# Patient Record
Sex: Male | Born: 1970
Health system: Southern US, Community
[De-identification: ages and names within clinical notes are randomized; demographics above are authoritative.]

## PROBLEM LIST (undated history)

## (undated) DIAGNOSIS — F329 Major depressive disorder, single episode, unspecified: Secondary | ICD-10-CM

## (undated) DIAGNOSIS — R634 Abnormal weight loss: Secondary | ICD-10-CM

## (undated) DIAGNOSIS — I10 Essential (primary) hypertension: Secondary | ICD-10-CM

## (undated) DIAGNOSIS — F1910 Other psychoactive substance abuse, uncomplicated: Secondary | ICD-10-CM

## (undated) DIAGNOSIS — G43909 Migraine, unspecified, not intractable, without status migrainosus: Secondary | ICD-10-CM

## (undated) DIAGNOSIS — I639 Cerebral infarction, unspecified: Secondary | ICD-10-CM

## (undated) DIAGNOSIS — H269 Unspecified cataract: Secondary | ICD-10-CM

## (undated) DIAGNOSIS — E042 Nontoxic multinodular goiter: Secondary | ICD-10-CM

## (undated) DIAGNOSIS — E785 Hyperlipidemia, unspecified: Secondary | ICD-10-CM

## (undated) DIAGNOSIS — J189 Pneumonia, unspecified organism: Secondary | ICD-10-CM

## (undated) DIAGNOSIS — M1990 Unspecified osteoarthritis, unspecified site: Secondary | ICD-10-CM

## (undated) DIAGNOSIS — K219 Gastro-esophageal reflux disease without esophagitis: Secondary | ICD-10-CM

## (undated) DIAGNOSIS — F410 Panic disorder [episodic paroxysmal anxiety] without agoraphobia: Secondary | ICD-10-CM

## (undated) DIAGNOSIS — R7989 Other specified abnormal findings of blood chemistry: Secondary | ICD-10-CM

## (undated) DIAGNOSIS — F32A Depression, unspecified: Secondary | ICD-10-CM

## (undated) DIAGNOSIS — R51 Headache: Secondary | ICD-10-CM

## (undated) DIAGNOSIS — M549 Dorsalgia, unspecified: Secondary | ICD-10-CM

## (undated) DIAGNOSIS — Z973 Presence of spectacles and contact lenses: Secondary | ICD-10-CM

## (undated) DIAGNOSIS — G5601 Carpal tunnel syndrome, right upper limb: Secondary | ICD-10-CM

## (undated) DIAGNOSIS — G2581 Restless legs syndrome: Secondary | ICD-10-CM

## (undated) DIAGNOSIS — I219 Acute myocardial infarction, unspecified: Secondary | ICD-10-CM

## (undated) HISTORY — DX: Gastro-esophageal reflux disease without esophagitis: K21.9

## (undated) HISTORY — DX: Other psychoactive substance abuse, uncomplicated: F19.10

## (undated) HISTORY — DX: Essential (primary) hypertension: I10

## (undated) HISTORY — DX: Restless legs syndrome: G25.81

## (undated) HISTORY — PX: ESOPHAGOGASTRODUODENOSCOPY: SHX1529

## (undated) HISTORY — DX: Other specified abnormal findings of blood chemistry: R79.89

## (undated) HISTORY — DX: Dorsalgia, unspecified: M54.9

## (undated) HISTORY — PX: MULTIPLE TOOTH EXTRACTIONS: SHX2053

## (undated) HISTORY — DX: Depression, unspecified: F32.A

## (undated) HISTORY — DX: Abnormal weight loss: R63.4

## (undated) HISTORY — DX: Migraine, unspecified, not intractable, without status migrainosus: G43.909

## (undated) HISTORY — DX: Hyperlipidemia, unspecified: E78.5

## (undated) HISTORY — DX: Unspecified cataract: H26.9

## (undated) HISTORY — DX: Major depressive disorder, single episode, unspecified: F32.9

## (undated) HISTORY — DX: Headache: R51

---

## 2010-05-04 LAB — HM DIABETES FOOT EXAM

## 2011-04-07 ENCOUNTER — Ambulatory Visit: Payer: Self-pay | Admitting: Internal Medicine

## 2011-04-11 ENCOUNTER — Ambulatory Visit: Payer: Self-pay | Admitting: Internal Medicine

## 2011-04-28 ENCOUNTER — Encounter: Payer: Self-pay | Admitting: Family Medicine

## 2011-04-28 ENCOUNTER — Other Ambulatory Visit: Payer: Self-pay | Admitting: Family Medicine

## 2011-04-28 ENCOUNTER — Ambulatory Visit: Payer: Self-pay | Admitting: Internal Medicine

## 2011-04-28 ENCOUNTER — Ambulatory Visit (INDEPENDENT_AMBULATORY_CARE_PROVIDER_SITE_OTHER): Payer: 59 | Admitting: Family Medicine

## 2011-04-28 DIAGNOSIS — R413 Other amnesia: Secondary | ICD-10-CM

## 2011-04-28 DIAGNOSIS — E119 Type 2 diabetes mellitus without complications: Secondary | ICD-10-CM

## 2011-04-28 LAB — COMPREHENSIVE METABOLIC PANEL
ALT: 19 U/L (ref 0–53)
AST: 22 U/L (ref 0–37)
Alkaline Phosphatase: 68 U/L (ref 39–117)
CO2: 24 mEq/L (ref 19–32)
Creat: 1.02 mg/dL (ref 0.50–1.35)
Sodium: 139 mEq/L (ref 135–145)
Total Bilirubin: 0.3 mg/dL (ref 0.3–1.2)
Total Protein: 7.6 g/dL (ref 6.0–8.3)

## 2011-04-28 LAB — CBC WITH DIFFERENTIAL/PLATELET
Basophils Absolute: 0.1 10*3/uL (ref 0.0–0.1)
Eosinophils Absolute: 0.1 10*3/uL (ref 0.0–0.7)
Eosinophils Relative: 2 % (ref 0–5)
Lymphs Abs: 2.1 10*3/uL (ref 0.7–4.0)
MCH: 32.1 pg (ref 26.0–34.0)
MCV: 90.3 fL (ref 78.0–100.0)
Neutrophils Relative %: 64 % (ref 43–77)
Platelets: 199 10*3/uL (ref 150–400)
RBC: 4.55 MIL/uL (ref 4.22–5.81)
RDW: 13.1 % (ref 11.5–15.5)
WBC: 7.2 10*3/uL (ref 4.0–10.5)

## 2011-04-28 LAB — LIPID PANEL
HDL: 25 mg/dL — ABNORMAL LOW (ref >39)
Total CHOL/HDL Ratio: 7.2 Ratio
VLDL: 36 mg/dL (ref 0–40)

## 2011-04-28 MED ORDER — SUMATRIPTAN SUCCINATE 100 MG PO TABS: 100.0000 mg | ORAL_TABLET | ORAL | Status: DC | PRN

## 2011-04-28 MED ORDER — FENOFIBRATE 160 MG PO TABS: 160.0000 mg | ORAL_TABLET | Freq: Every day | ORAL | Status: DC

## 2011-04-28 MED ORDER — INSULIN ASPART 100 UNIT/ML ~~LOC~~ SOLN: SUBCUTANEOUS | Status: DC

## 2011-04-28 MED ORDER — ROSUVASTATIN CALCIUM 20 MG PO TABS: 20.0000 mg | ORAL_TABLET | Freq: Every day | ORAL | Status: DC

## 2011-04-28 NOTE — Progress Notes (Signed)
Office Note 05/01/2011  CC:  Chief Complaint  Patient presents with  . Establish Care  . Medication Refill    HPI:  [name] is a 40 y.o. White male who is here to establish care. Patient's most recent primary MD: Dr. Renne Crigler. Old records were reviewed prior to or during today's visit--some labs from his endocrinologist, Dr. Katrinka Blazing at [facility] .  Pt here with his wife today, both a bit frustrated. Had to change MD due to insurance, sees endo as well but wants to establish here and try to get everything managed here for convenience. Lives in [place]. Main issue is DM.  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 Says he was dx'd about 12-14 yrs ago, was on oral med "briefly" and had no effect, was switched to insulin after this and he claims he's been "far from well controlled", although his wife remembers A1c recently "about 7".  No hx of D.R or nephropathy.  Has had intermittent arm pains/paresthesias that have been dx'd as peripheral neuropathy and have improved on cymbalta, which has also helped some depression. He and wife quote some glucoses that "don't make sense"---some erratic readings, even some in the 20's without symptoms, then some in the 70s that he feels hypoglycemia sx's with.  Has c/o of memory problems which he says his last primary MD promised neuro referral for but never did.  He asks for neuro referral for this today. Reports hx of multinodular goiter, euthyroid per his report.  Past Medical History  Diagnosis Date  . Drug abuse     7 -8 years ago  . Depression     [facility]  . Diabetes mellitus     diagnosed 14 years ago  . Headache     sight/sound sensitvity  . GERD (gastroesophageal reflux disease)     onset age 67   . Allergic rhinitis     year round  . Hypertension     diagnosed age 47  . Hyperlipidemia     dx 10 years ago  Tobacco dependence--wants to quit but not contemplating attempt at this time.  Past Surgical History  Procedure Date  . No past surgeries     denies surgical history    Family History  Problem Relation Age of Onset  . Drug abuse Brother   . Drug abuse Father   . Drug abuse Mother   . Arthritis Father   . Arthritis      maternal and paternal grandaparents  . Colon cancer Paternal Grandmother   . Hyperlipidemia Father   . Hyperlipidemia Maternal Grandfather   . Hyperlipidemia Paternal Grandmother   . Hyperlipidemia Paternal Grandfather   . Hyperlipidemia Maternal Grandmother   . Heart disease Father     4-5 Heart attacks died age 77   . Stroke Father     age 34  . Heart disease Maternal Grandfather   . Heart disease Paternal Grandfather   . Stroke Maternal Grandmother   . Hypertension Father   . Hypertension      maternal and paternal grandparents  . Diabetes Father     type II  . Diabetes Maternal Grandmother     History   Social History  . Marital Status: Married  Spouse Name: N/A    Number of Children: N/A  . Years of Education: N/A   Occupational History  . Not on file.   Social History Main Topics  . Smoking status: Current Everyday Smoker    Types: Cigarettes  . Smokeless tobacco: Not on file   Comment: 1 ppd-started age [age]  . Alcohol Use: No  . Drug Use: No  . Sexually Active: Not on file   Other Topics Concern  . Not on file   Social History Narrative   Married, one 48 y/o son, currently unemployed.+Smoker.  No alc/drugs.    Outpatient Encounter Prescriptions as of 04/28/2011  Medication Sig Dispense Refill  . amLODipine (NORVASC) 10 MG tablet Take 10 mg by mouth daily.        . benazepril (LOTENSIN) 40 MG tablet Take 40 mg by mouth daily.        . DULoxetine (CYMBALTA) 60 MG capsule Take 60 mg by mouth daily.        Marland Kitchen  esomeprazole (NEXIUM) 40 MG capsule Take 40 mg by mouth daily before breakfast.        . fenofibrate 160 MG tablet Take 1 tablet (160 mg total) by mouth daily.  30 tablet  3  . insulin glargine (LANTUS SOLOSTAR) 100 UNIT/ML injection Inject 25 Units into the skin at bedtime.        . rosuvastatin (CRESTOR) 20 MG tablet Take 1 tablet (20 mg total) by mouth daily.  30 tablet  3  . SUMAtriptan (IMITREX) 100 MG tablet Take 1 tablet (100 mg total) by mouth every 2 (two) hours as needed. For headache  10 tablet  3  . DISCONTD: fenofibrate 160 MG tablet Take 160 mg by mouth daily.        Marland Kitchen DISCONTD: insulin aspart (NOVOLOG) 100 UNIT/ML injection Inject into the skin. Sliding scale       . DISCONTD: rosuvastatin (CRESTOR) 20 MG tablet Take 20 mg by mouth daily.        Marland Kitchen DISCONTD: SUMAtriptan (IMITREX) 100 MG tablet Take 100 mg by mouth every 2 (two) hours as needed. For headache       . insulin aspart (NOVOLOG FLEXPEN) 100 UNIT/ML injection Inject with meals as per your sliding scale instructions  3 mL  6    No Known Allergies  ROS Review of Systems  Constitutional: Negative for fever and fatigue.  HENT: Negative for congestion and sore throat. Ear pain: left ear w/sharp pain intermittently for 71mo.   Eyes: Negative for visual disturbance.  Respiratory: Negative for cough.   Cardiovascular: Negative for chest pain.  Gastrointestinal: Negative for nausea and abdominal pain.  Genitourinary: Negative for dysuria.  Musculoskeletal: Negative for back pain and joint swelling.  Skin: Negative for rash.  Neurological: Positive for headaches. Negative for weakness.  Hematological: Negative for adenopathy.     PE; Blood pressure 122/80, pulse 91, temperature 97.7 F (36.5 C), temperature source Oral, resp. rate 20, height 5\' 10"  (1.778 m), weight 201 lb (91.173 kg), SpO2 100.00%. Gen: Alert, well appearing.  Patient is oriented to person, place, time, and situation. HEENT: Scalp without lesions or  hair loss.  Ears: EACs clear, normal epithelium.  TMs with good light reflex and landmarks bilaterally.  Eyes: no injection, icteris, swelling, or exudate.  EOMI, PERRLA. Nose: no drainage or turbinate edema/swelling.  No injection or focal lesion.  Mouth: lips without lesion/swelling.  Oral mucosa pink and moist.  Dentition intact and without obvious caries or  gingival swelling.  Oropharynx without erythema, exudate, or swelling.  Neck: supple, ROM full.  Carotids 2+ bilat, without bruit.  No lymphadenopathy, thyromegaly, or mass. Chest: symmetric expansion, nonlabored respirations.  Clear and equal breath sounds in all lung fields.   CV: RRR, no m/r/g.  Peripheral pulses 2+ and symmetric. EXT: no clubbing, cyanosis, or edema.   Pertinent labs:  none  ASSESSMENT AND PLAN:   Diabetes mellitus Insulin dependent, with diabetic neuropathy in UE's only. Will gather records. Cont current care at this time. Check HbA1c and urine microalbumin today, as well as lipids, CBC, CMET, and TSH.  Memory loss We did not explore this complaint today, but patient pretty frustrated and asks for neuro referral that he had been promised in the past by his prior MD, so will make neuro referral today.   Otalgia, left.  Reassured pt today that no abnormality was found.  Return in about 3 months (around 07/29/2011).

## 2011-04-29 LAB — T3: T3, Total: 76.6 ng/dL — ABNORMAL LOW (ref 80.0–204.0)

## 2011-05-01 ENCOUNTER — Encounter: Payer: Self-pay | Admitting: Family Medicine

## 2011-05-01 DIAGNOSIS — E1165 Type 2 diabetes mellitus with hyperglycemia: Secondary | ICD-10-CM | POA: Insufficient documentation

## 2011-05-01 DIAGNOSIS — R413 Other amnesia: Secondary | ICD-10-CM | POA: Insufficient documentation

## 2011-05-01 NOTE — Assessment & Plan Note (Addendum)
Insulin dependent, with diabetic neuropathy in UE's only. Will gather records. Cont current care at this time. Check HbA1c and urine microalbumin today, as well as lipids, CBC, CMET, and TSH.

## 2011-05-01 NOTE — Assessment & Plan Note (Signed)
We did not explore this complaint today, but patient pretty frustrated and asks for neuro referral that he had been promised in the past by his prior MD, so will make neuro referral today.

## 2011-05-03 ENCOUNTER — Telehealth: Payer: Self-pay | Admitting: Family Medicine

## 2011-05-03 NOTE — Telephone Encounter (Signed)
Please request records from [facility] (Dr. Renne Crigler and Dr. Talmage Nap both saw him).  Thx--PM

## 2011-05-20 ENCOUNTER — Telehealth: Payer: Self-pay | Admitting: Family Medicine

## 2011-05-20 NOTE — Telephone Encounter (Signed)
Please request records again from Dr. Renne Crigler and Dr. Talmage Nap at [facility]--Thx.

## 2011-05-20 NOTE — Telephone Encounter (Signed)
This was sent to me in error.

## 2011-06-02 ENCOUNTER — Telehealth: Payer: Self-pay | Admitting: Internal Medicine

## 2011-06-02 ENCOUNTER — Emergency Department (HOSPITAL_BASED_OUTPATIENT_CLINIC_OR_DEPARTMENT_OTHER)
Admission: EM | Admit: 2011-06-02 | Discharge: 2011-06-03 | Disposition: A | Discharge status: Home / Self Care | Payer: 59 | Attending: Emergency Medicine | Admitting: Emergency Medicine

## 2011-06-02 ENCOUNTER — Encounter (HOSPITAL_BASED_OUTPATIENT_CLINIC_OR_DEPARTMENT_OTHER): Payer: Self-pay | Admitting: *Deleted

## 2011-06-02 DIAGNOSIS — L0291 Cutaneous abscess, unspecified: Secondary | ICD-10-CM | POA: Insufficient documentation

## 2011-06-02 DIAGNOSIS — E785 Hyperlipidemia, unspecified: Secondary | ICD-10-CM | POA: Insufficient documentation

## 2011-06-02 DIAGNOSIS — I10 Essential (primary) hypertension: Secondary | ICD-10-CM | POA: Insufficient documentation

## 2011-06-02 DIAGNOSIS — K219 Gastro-esophageal reflux disease without esophagitis: Secondary | ICD-10-CM | POA: Insufficient documentation

## 2011-06-02 DIAGNOSIS — F17200 Nicotine dependence, unspecified, uncomplicated: Secondary | ICD-10-CM | POA: Insufficient documentation

## 2011-06-02 DIAGNOSIS — M79609 Pain in unspecified limb: Secondary | ICD-10-CM | POA: Insufficient documentation

## 2011-06-02 DIAGNOSIS — L0390 Cellulitis, unspecified: Secondary | ICD-10-CM

## 2011-06-02 DIAGNOSIS — E119 Type 2 diabetes mellitus without complications: Secondary | ICD-10-CM | POA: Insufficient documentation

## 2011-06-02 LAB — CBC
Hemoglobin: 13.1 g/dL (ref 13.0–17.0)
MCH: 32 pg (ref 26.0–34.0)
MCV: 89 fL (ref 78.0–100.0)
RBC: 4.09 MIL/uL — ABNORMAL LOW (ref 4.22–5.81)
WBC: 8.1 10*3/uL (ref 4.0–10.5)

## 2011-06-02 MED ORDER — SODIUM CHLORIDE 0.9 % IV SOLN
INTRAVENOUS | Status: DC
  Administered 2011-06-02: 23:00:00 via INTRAVENOUS

## 2011-06-02 MED ORDER — SODIUM CHLORIDE 0.9 % IV SOLN: 20.0000 mL | INTRAVENOUS | Status: DC

## 2011-06-02 MED ORDER — HYDROMORPHONE HCL 1 MG/ML IJ SOLN
1.0000 mg | Freq: Once | INTRAMUSCULAR | Status: AC
  Administered 2011-06-02: 1 mg via INTRAVENOUS
  Filled 2011-06-02: qty 1

## 2011-06-02 MED ORDER — CLINDAMYCIN PHOSPHATE 900 MG/50ML IV SOLN
900.0000 mg | Freq: Once | INTRAVENOUS | Status: AC
  Administered 2011-06-02: 900 mg via INTRAVENOUS
  Filled 2011-06-02: qty 50

## 2011-06-02 NOTE — ED Notes (Signed)
Pt reports right elbow redness/swelling/pain that began approx 2days ago. Saw an Urgent care yesterday, and was prescribed Doxycycline PO RX and given a Rocephin injection at visit. Was told to f/u sooner if appearance worsened. Pt states the redness and pain worsened at 4am today. Redness has extended past the previously marked site. Concerned that ABX is not effectively treating infection. Family hx of MRSA, but none personally

## 2011-06-02 NOTE — Telephone Encounter (Signed)
Comments from [facility] pharmacy:  Patient is now seeing your office for his diabetic needs. I need two prescriptions until he can be seen at his September appt.   Alcohol pads 90 day supply(uses for injections and monitoring, #8-10+ ?/day) (Sent 1 box at no charge)  One touch ultra test strips # 800. Check blood sugar 8 times/per day.

## 2011-06-02 NOTE — ED Notes (Signed)
Pt has ABX infusing. States pain has started to subside. Denies any complaints at this time. Reading magazine at this time. Instructed to notify staff of any additional needs.

## 2011-06-03 LAB — BASIC METABOLIC PANEL
BUN: 16 mg/dL (ref 6–23)
CO2: 21 mEq/L (ref 19–32)
Calcium: 9.4 mg/dL (ref 8.4–10.5)
Creatinine, Ser: 0.8 mg/dL (ref 0.50–1.35)
Glucose, Bld: 216 mg/dL — ABNORMAL HIGH (ref 70–99)

## 2011-06-03 MED ORDER — HYDROCODONE-ACETAMINOPHEN 5-325 MG PO TABS: 2.0000 | ORAL_TABLET | ORAL | Status: AC | PRN

## 2011-06-03 MED ORDER — GLUCOSE BLOOD VI STRP: ORAL_STRIP | Status: DC

## 2011-06-03 MED ORDER — HYDROCODONE-ACETAMINOPHEN 5-325 MG PO TABS
2.0000 | ORAL_TABLET | Freq: Once | ORAL | Status: AC
  Administered 2011-06-03: 2 via ORAL
  Filled 2011-06-03: qty 2

## 2011-06-03 MED ORDER — ROSUVASTATIN CALCIUM 40 MG PO TABS: 40.0000 mg | ORAL_TABLET | Freq: Every day | ORAL | Status: DC

## 2011-06-03 MED ORDER — ALCOHOL PREPS PADS: MEDICATED_PAD | Status: DC

## 2011-06-03 MED ORDER — CLINDAMYCIN HCL 150 MG PO CAPS: 450.0000 mg | ORAL_CAPSULE | Freq: Three times a day (TID) | ORAL | Status: AC

## 2011-06-03 NOTE — Telephone Encounter (Signed)
Believe he is using lantus + sliding scale which account for increased fsbs and injxns. Ok to authorize. Likely will require insurance override form due to amounts.

## 2011-06-03 NOTE — Telephone Encounter (Signed)
Left message for pt to return my call re: reason for checking BS 8 times daily.

## 2011-06-03 NOTE — Telephone Encounter (Signed)
Pt notified and requested refill on his Crestor 40mg . Refill sent to pharmacy.

## 2011-06-03 NOTE — Telephone Encounter (Signed)
Pt returned my call and states he is no longer using an insulin pump and is now taking 6 insulin injections a day. He reports that he is checking his blood sugar at least 6 times a day and sometimes 8 due to fluctuations in his blood sugar levels. Pt is requesting 9 boxes of alcohol pads for 90 days and # 800 test strips for 90 days. Please advise.

## 2011-06-03 NOTE — Telephone Encounter (Signed)
Patient returned phone call. Best# [phone]

## 2011-06-03 NOTE — ED Provider Notes (Addendum)
History    the patient is a 40 year old male who presents with 3 days of worsening erythema induration and tenderness over the right forearm/upper arm, seen by his primary care physician 2 days ago and started on doxycycline yesterday with progression despite antibiotics use. It is evident within the area of the erythema where the patient's physician had circumscribed the extent of erythema yesterday. Today, erythema has spread approximately 2-3 cm in all directions outside of the circumscribed area, demonstrating the progression of the patient's cellulitis. Within the center of the cellulitis is a scabbed lesion the patient said had been there "for about 2 months", and he is unsure of what the original insult to the skin was. The pain at the skin lesion is described as tenderness and is made worse by palpation.  Chief Complaint  Patient presents with  . Arm Pain  . Wound Infection   Patient is a 40 y.o. male presenting with arm pain. The history is provided by the patient.  Arm Pain This is a new problem. Episode onset: 3 days ago. The problem occurs constantly. The problem has been gradually worsening. The symptoms are relieved by nothing. The treatment provided no relief.    Past Medical History  Diagnosis Date  . Drug abuse     7 -8 years ago  . Depression     [facility]  . Diabetes mellitus     diagnosed 14 years ago  . Headache     sight/sound sensitvity  . GERD (gastroesophageal reflux disease)     onset age 53  . Allergic rhinitis     year round  . Hypertension     diagnosed age 37  . Hyperlipidemia     dx 10 years ago    Past Surgical History  Procedure Date  . No past surgeries     denies surgical history    Family History  Problem Relation Age of Onset  . Drug abuse Brother   . Drug abuse Father   . Drug abuse Mother   . Arthritis Father   . Arthritis      maternal and paternal grandaparents  . Colon cancer  Paternal Grandmother   . Hyperlipidemia Father   . Hyperlipidemia Maternal Grandfather   . Hyperlipidemia Paternal Grandmother   . Hyperlipidemia Paternal Grandfather   . Hyperlipidemia Maternal Grandmother   . Heart disease Father     4-5 Heart attacks died age 29   . Stroke Father     age 71  . Heart disease Maternal Grandfather   . Heart disease Paternal Grandfather   . Stroke Maternal Grandmother   . Hypertension Father   . Hypertension      maternal and paternal grandparents  . Diabetes Father     type II  . Diabetes Maternal Grandmother     History  Substance Use Topics  . Smoking status: Current Everyday Smoker -- 1.0 packs/day    Types: Cigarettes  . Smokeless tobacco: Not on file   Comment: 1 ppd-started age [age]  . Alcohol Use: No      Review of Systems  Constitutional: Negative for fever, chills and fatigue.  Musculoskeletal: Negative for joint swelling and arthralgias.  Skin: Positive for color change, rash and wound.  Hematological: Negative for adenopathy.    Physical Exam  BP 115/79  Pulse 81  Temp(Src) 97.7 F (36.5 C) (Oral)  Resp 19  SpO2 99%  Physical Exam  Constitutional: He  is oriented to person, place, and time. He appears well-developed and well-nourished. No distress.  HENT:  Head: Normocephalic and atraumatic.  Eyes: EOM are normal. Pupils are equal, round, and reactive to light.  Neck: Normal range of motion. Neck supple.  Cardiovascular: Normal rate and regular rhythm.   Pulmonary/Chest: Effort normal and breath sounds normal.  Musculoskeletal: He exhibits no edema and no tenderness.  Lymphadenopathy:    He has no axillary adenopathy.  Neurological: He is alert and oriented to person, place, and time.  Skin: Skin is warm and dry. Rash noted. Rash is macular. He is not diaphoretic. There is erythema.     Psychiatric: He has a normal mood and affect.    ED Course  Procedures 900 mg clindamycin infused by IV in ED.  I will  switch the patient from doxycycline to oral clindamycin for antibiotic coverage, and have him follow up in the ED or with his PCP tomorrow to recheck the cellulitis and assure that the line of erythema is retreating.  The patient states his understanding of and agreement with this plan of care. MDM Cellulitis, MRSA      Felisa Bonier, MD 06/03/11 0020    Call from outpt pharmacy, to check on dose of the clindamycin.  I checked on pt and he has a cellulitis of the right elbow.  He had been prescribed clindamycin.  I checked the dose of this and made his prescription for clindamycin 450 mg qid, Rx enough for 3 days.  He is returning to be rechecked later today.  If he ends up needing hospitalization for IV antibiotics he will not need ten days worth of clindamycin.    Carleene Cooper III, MD 06/03/11 [phone]

## 2011-06-04 ENCOUNTER — Inpatient Hospital Stay (HOSPITAL_COMMUNITY)
Admission: AD | Admit: 2011-06-04 | Discharge: 2011-06-06 | DRG: 603 | Disposition: A | Discharge status: Home / Self Care | Payer: 59 | Source: Other Acute Inpatient Hospital | Attending: Infectious Diseases | Admitting: Infectious Diseases

## 2011-06-04 ENCOUNTER — Emergency Department (HOSPITAL_BASED_OUTPATIENT_CLINIC_OR_DEPARTMENT_OTHER)
Admission: EM | Admit: 2011-06-04 | Discharge: 2011-06-04 | Disposition: A | Discharge status: Still In Hospital | Payer: 59 | Source: Home / Self Care | Attending: Emergency Medicine | Admitting: Emergency Medicine

## 2011-06-04 ENCOUNTER — Encounter (HOSPITAL_BASED_OUTPATIENT_CLINIC_OR_DEPARTMENT_OTHER): Payer: Self-pay | Admitting: Emergency Medicine

## 2011-06-04 DIAGNOSIS — F17200 Nicotine dependence, unspecified, uncomplicated: Secondary | ICD-10-CM | POA: Diagnosis present

## 2011-06-04 DIAGNOSIS — Z794 Long term (current) use of insulin: Secondary | ICD-10-CM

## 2011-06-04 DIAGNOSIS — F1910 Other psychoactive substance abuse, uncomplicated: Secondary | ICD-10-CM | POA: Diagnosis present

## 2011-06-04 DIAGNOSIS — F3289 Other specified depressive episodes: Secondary | ICD-10-CM | POA: Diagnosis present

## 2011-06-04 DIAGNOSIS — E119 Type 2 diabetes mellitus without complications: Secondary | ICD-10-CM | POA: Diagnosis present

## 2011-06-04 DIAGNOSIS — I10 Essential (primary) hypertension: Secondary | ICD-10-CM | POA: Diagnosis present

## 2011-06-04 DIAGNOSIS — IMO0002 Reserved for concepts with insufficient information to code with codable children: Principal | ICD-10-CM | POA: Diagnosis present

## 2011-06-04 DIAGNOSIS — L0390 Cellulitis, unspecified: Secondary | ICD-10-CM

## 2011-06-04 DIAGNOSIS — F329 Major depressive disorder, single episode, unspecified: Secondary | ICD-10-CM | POA: Diagnosis present

## 2011-06-04 LAB — GLUCOSE, CAPILLARY: Glucose-Capillary: 90 mg/dL (ref 70–99)

## 2011-06-04 LAB — MRSA PCR SCREENING: MRSA by PCR: NEGATIVE

## 2011-06-04 LAB — BASIC METABOLIC PANEL
BUN: 19 mg/dL (ref 6–23)
Calcium: 9.6 mg/dL (ref 8.4–10.5)
GFR calc Af Amer: >60 mL/min (ref >60)
GFR calc non Af Amer: >60 mL/min (ref >60)
Potassium: 3.6 mEq/L (ref 3.5–5.1)
Sodium: 139 mEq/L (ref 135–145)

## 2011-06-04 LAB — DIFFERENTIAL
Basophils Relative: 1 % (ref 0–1)
Eosinophils Absolute: 0.1 10*3/uL (ref 0.0–0.7)
Eosinophils Relative: 1 % (ref 0–5)
Neutrophils Relative %: 67 % (ref 43–77)

## 2011-06-04 LAB — CBC
MCH: 32.3 pg (ref 26.0–34.0)
MCHC: 35.6 g/dL (ref 30.0–36.0)
Platelets: 160 10*3/uL (ref 150–400)

## 2011-06-04 MED ORDER — MORPHINE SULFATE 4 MG/ML IJ SOLN
INTRAMUSCULAR | Status: AC
  Administered 2011-06-04: 4 mg via INTRAVENOUS
  Filled 2011-06-04: qty 1

## 2011-06-04 MED ORDER — LIDOCAINE HCL 2 % IJ SOLN
20.0000 mL | Freq: Once | INTRAMUSCULAR | Status: AC
  Administered 2011-06-04: 400 mg

## 2011-06-04 MED ORDER — INSULIN ASPART 100 UNIT/ML ~~LOC~~ SOLN
15.0000 [IU] | Freq: Once | SUBCUTANEOUS | Status: AC
  Administered 2011-06-04: 15 [IU] via SUBCUTANEOUS
  Filled 2011-06-04: qty 3

## 2011-06-04 MED ORDER — VANCOMYCIN HCL IN DEXTROSE 1-5 GM/200ML-% IV SOLN
INTRAVENOUS | Status: AC
  Administered 2011-06-04: 1000 mg via INTRAVENOUS
  Filled 2011-06-04: qty 200

## 2011-06-04 MED ORDER — VANCOMYCIN HCL IN DEXTROSE 1-5 GM/200ML-% IV SOLN
1000.0000 mg | Freq: Once | INTRAVENOUS | Status: AC
  Administered 2011-06-04: 1000 mg via INTRAVENOUS

## 2011-06-04 MED ORDER — HYDROMORPHONE HCL 1 MG/ML IJ SOLN
1.0000 mg | Freq: Once | INTRAMUSCULAR | Status: AC
  Administered 2011-06-04: 1 mg via INTRAVENOUS
  Filled 2011-06-04: qty 1

## 2011-06-04 MED ORDER — MORPHINE SULFATE 4 MG/ML IJ SOLN
4.0000 mg | Freq: Once | INTRAMUSCULAR | Status: AC
  Administered 2011-06-04: 4 mg via INTRAVENOUS

## 2011-06-04 MED ORDER — SODIUM CHLORIDE 0.9 % IV SOLN
Freq: Once | INTRAVENOUS | Status: AC
  Administered 2011-06-04: 13:00:00 via INTRAVENOUS

## 2011-06-04 MED ORDER — LIDOCAINE HCL 2 % IJ SOLN
INTRAMUSCULAR | Status: AC
  Administered 2011-06-04: 400 mg
  Filled 2011-06-04: qty 1

## 2011-06-04 MED ORDER — INSULIN ASPART 100 UNIT/ML ~~LOC~~ SOLN
SUBCUTANEOUS | Status: AC
  Filled 2011-06-04: qty 3

## 2011-06-04 MED ORDER — VANCOMYCIN HCL 10 G IV SOLR
1.0000 g | Freq: Once | INTRAVENOUS | Status: DC
  Filled 2011-06-04: qty 1000

## 2011-06-04 MED ORDER — SODIUM CHLORIDE 0.9 % IV SOLN: Freq: Once | INTRAVENOUS | Status: DC

## 2011-06-04 NOTE — ED Notes (Signed)
Pt has abcess to right elbow which has progressed in size and redness.

## 2011-06-04 NOTE — ED Notes (Signed)
Patient is resting comfortably.    IV infusing well.  No acute distress noted.

## 2011-06-04 NOTE — ED Provider Notes (Signed)
History     Chief Complaint  Patient presents with  . Abscess   HPI  Past Medical History  Diagnosis Date  . Drug abuse     7 -8 years ago  . Depression     [facility]  . Diabetes mellitus     diagnosed 14 years ago  . Headache     sight/sound sensitvity  . GERD (gastroesophageal reflux disease)     onset age 40  . Allergic rhinitis     year round  . Hypertension     diagnosed age 40  . Hyperlipidemia     dx 10 years ago    Past Surgical History  Procedure Date  . No past surgeries     denies surgical history    Family History  Problem Relation Age of Onset  . Drug abuse Brother   . Drug abuse Father   . Drug abuse Mother   . Arthritis Father   . Arthritis      maternal and paternal grandaparents  . Colon cancer Paternal Grandmother   . Hyperlipidemia Father   . Hyperlipidemia Maternal Grandfather   . Hyperlipidemia Paternal Grandmother   . Hyperlipidemia Paternal Grandfather   . Hyperlipidemia Maternal Grandmother   . Heart disease Father     4-5 Heart attacks died age 21   . Stroke Father     age 35  . Heart disease Maternal Grandfather   . Heart disease Paternal Grandfather   . Stroke Maternal Grandmother   . Hypertension Father   . Hypertension      maternal and paternal grandparents  . Diabetes Father     type II  . Diabetes Maternal Grandmother     History  Substance Use Topics  . Smoking status: Current Everyday Smoker -- 1.0 packs/day    Types: Cigarettes  . Smokeless tobacco: Not on file   Comment: 1 ppd-started age [age]  . Alcohol Use: No      Review of Systems  Physical Exam  BP 123/89  Pulse 88  Temp(Src) 98.3 F (36.8 C) (Oral)  Resp 20  Ht 5\' 10"  (1.778 m)  Wt 203 lb (92.08 kg)  BMI 29.13 kg/m2  SpO2 99%  Physical Exam  ED Course  Procedures  MDM       Rodena Medin, PA 06/04/11 1420

## 2011-06-05 LAB — GLUCOSE, CAPILLARY
Glucose-Capillary: 303 mg/dL — ABNORMAL HIGH (ref 70–99)
Glucose-Capillary: 65 mg/dL — ABNORMAL LOW (ref 70–99)

## 2011-06-05 LAB — HEMOGLOBIN A1C
Hgb A1c MFr Bld: 7.7 % — ABNORMAL HIGH (ref <5.7)
Mean Plasma Glucose: 174 mg/dL — ABNORMAL HIGH (ref <117)

## 2011-06-06 LAB — CBC
HCT: 33.7 % — ABNORMAL LOW (ref 39.0–52.0)
Hemoglobin: 11.9 g/dL — ABNORMAL LOW (ref 13.0–17.0)
MCV: 89.9 fL (ref 78.0–100.0)
RDW: 12.5 % (ref 11.5–15.5)
WBC: 4.5 10*3/uL (ref 4.0–10.5)

## 2011-06-06 LAB — BASIC METABOLIC PANEL
BUN: 14 mg/dL (ref 6–23)
Chloride: 99 mEq/L (ref 96–112)
Creatinine, Ser: 1 mg/dL (ref 0.50–1.35)
Glucose, Bld: 168 mg/dL — ABNORMAL HIGH (ref 70–99)
Potassium: 3.7 mEq/L (ref 3.5–5.1)

## 2011-06-08 LAB — CULTURE, ROUTINE-ABSCESS

## 2011-06-09 LAB — CULTURE, BLOOD (ROUTINE X 2)
Culture  Setup Time: 201207130514
Culture: NO GROWTH

## 2011-06-11 NOTE — Discharge Summary (Signed)
[name], [name]                  ACCOUNT NO.:  [id number]  MEDICAL RECORD NO.:  [id number]  LOCATION:  1308                         FACILITY:  [facility]  PHYSICIAN:  Kela Millin, M.D.DATE OF BIRTH:  1971-07-16  DATE OF ADMISSION:  06/04/2011 DATE OF DISCHARGE:  06/06/2011                        DISCHARGE SUMMARY - REFERRING   DISCHARGE DIAGNOSES: 1. Right elbow abscess/cellulitis - blood and abscess cultures     negative to date, follow up with primary care physician. 2. Diabetes mellitus. 3. Hypertension. 4. Depression. 5. Prior history of drug abuse. 6. Tobacco abuse.  PROCEDURES AND STUDIES:  Status post I and D per ED physician at the [facility].  BRIEF HISTORY:  The patient is a 40 year old white male with above- listed medical problems who presented with complaints of increasing redness, pain and swelling on his right elbow for several days.  He reported he had been seen twice as an outpatient, the first time he was started on doxycycline and the second time clindamycin.  However, the redness that was noted on each of those visits continued to expand.  On the day prior to admission, he reported that hit his arm against something and a lot of pus drained.  He presented to the [facility] ED where incision and drainage was done and cultures sent and he was started on vancomycin and transferred to [facility] for admission.  He was admitted for further evaluation and management.  HOSPITAL COURSE: 1. Right elbow abscess/cellulitis - as discussed above I and D was     [facility].  The patient was started     on empiric antibiotics with vancomycin.  Blood and abscess cultures     done so far are showing no growth.  The patient has remained     afebrile with no leukocytosis.  His last white cell count today is     4.5.  He has improved clinically with decreased erythema, swelling     and drainage.  He is to continue  dressing changes b.i.d. upon     discharge and he states that his wife is able to [name] the dressing     changes and that she works at a [employer].  He will be     discharged on oral antibiotics - doxycycline and is to follow up     with Dr. Artist Pais and have followup on the final blood and abscess     cultures at that time as well. 2. Diabetes mellitus - the patient was maintained on Lantus during     this hospital stay as well as sliding scale.  He is to continue his     Lantus and follow up outpatient. 3. Hypertension - he is to continue his outpatient medications upon     discharge. 4. His other chronic medical conditions remained stable during this     hospital stay and he is to continue his outpatient medications as     previously.  DISCHARGE MEDICATIONS: 1. Doxycycline 100 mg p.o. b.i.d. 2. Amlodipine/benazepril 10/40 one p.o. daily. 3. Aspirin 325 mg p.o. daily. 4. Zyrtec 10 mg p.o. daily.  5. Crestor 20 mg 2 tablets daily. 6. Cymbalta 60 mg one p.o. daily. 7. Vicodin 5/325 two tablets q.4 h p.r.n. 8. Lantus 25 units subcu q.h.s.9. Nexium 40 mg p.o. daily. 10.NovoLog sliding scale as previously.  FOLLOWUP CARE:  Dr. Artist Pais in 1 to 2 weeks, call for appointment.  DISCHARGE CONDITION:  Improved/stable.     Kela Millin, M.D.     ACV/MEDQ  D:  06/06/2011  T:  06/06/2011  Job:  [id number]  cc:   Mark Hair. [place], [name] [address] [place], [place] [address]  Electronically Signed by Donnalee Curry M.D. on 06/11/2011 09:55:00 PM

## 2011-06-17 ENCOUNTER — Telehealth: Payer: Self-pay | Admitting: Internal Medicine

## 2011-06-17 MED ORDER — ESOMEPRAZOLE MAGNESIUM 40 MG PO CPDR: 40.0000 mg | DELAYED_RELEASE_CAPSULE | Freq: Every day | ORAL | Status: DC

## 2011-06-17 NOTE — Telephone Encounter (Signed)
Rx refill sent to pharmacy. 

## 2011-06-17 NOTE — Telephone Encounter (Signed)
Refill- nexium 40mg  capsule. Take one capsule by mouth daily. Qty 90. Last fill 5.8.12

## 2011-08-04 ENCOUNTER — Ambulatory Visit (HOSPITAL_BASED_OUTPATIENT_CLINIC_OR_DEPARTMENT_OTHER)
Admission: RE | Admit: 2011-08-04 | Discharge: 2011-08-04 | Disposition: A | Discharge status: Home / Self Care | Payer: 59 | Source: Ambulatory Visit | Attending: Internal Medicine | Admitting: Internal Medicine

## 2011-08-04 ENCOUNTER — Ambulatory Visit: Payer: 59 | Admitting: Internal Medicine

## 2011-08-04 ENCOUNTER — Encounter: Payer: Self-pay | Admitting: Internal Medicine

## 2011-08-04 ENCOUNTER — Ambulatory Visit (INDEPENDENT_AMBULATORY_CARE_PROVIDER_SITE_OTHER): Payer: 59 | Admitting: Internal Medicine

## 2011-08-04 DIAGNOSIS — E049 Nontoxic goiter, unspecified: Secondary | ICD-10-CM | POA: Insufficient documentation

## 2011-08-04 DIAGNOSIS — E042 Nontoxic multinodular goiter: Secondary | ICD-10-CM

## 2011-08-04 DIAGNOSIS — R946 Abnormal results of thyroid function studies: Secondary | ICD-10-CM

## 2011-08-04 DIAGNOSIS — E041 Nontoxic single thyroid nodule: Secondary | ICD-10-CM

## 2011-08-04 DIAGNOSIS — E119 Type 2 diabetes mellitus without complications: Secondary | ICD-10-CM

## 2011-08-04 DIAGNOSIS — Z79899 Other long term (current) drug therapy: Secondary | ICD-10-CM

## 2011-08-04 DIAGNOSIS — R5383 Other fatigue: Secondary | ICD-10-CM

## 2011-08-04 LAB — BASIC METABOLIC PANEL
BUN: 18 mg/dL (ref 6–23)
CO2: 21 mEq/L (ref 19–32)
Calcium: 9.7 mg/dL (ref 8.4–10.5)
Chloride: 102 mEq/L (ref 96–112)
Creat: 1.12 mg/dL (ref 0.50–1.35)
Glucose, Bld: 285 mg/dL — ABNORMAL HIGH (ref 70–99)

## 2011-08-04 LAB — HEPATIC FUNCTION PANEL
AST: 21 U/L (ref 0–37)
Albumin: 5 g/dL (ref 3.5–5.2)
Alkaline Phosphatase: 88 U/L (ref 39–117)
Indirect Bilirubin: 0.2 mg/dL (ref 0.0–0.9)
Total Protein: 7.5 g/dL (ref 6.0–8.3)

## 2011-08-04 LAB — CBC
HCT: 40.8 % (ref 39.0–52.0)
Hemoglobin: 14.1 g/dL (ref 13.0–17.0)
MCH: 32.3 pg (ref 26.0–34.0)
MCV: 93.6 fL (ref 78.0–100.0)
RBC: 4.36 MIL/uL (ref 4.22–5.81)
WBC: 6.1 10*3/uL (ref 4.0–10.5)

## 2011-08-04 LAB — TESTOSTERONE: Testosterone: 436.76 ng/dL (ref 250–890)

## 2011-08-04 NOTE — Patient Instructions (Signed)
Please schedule chem7, a1c, urine microalbumin 250.0 and lipid 272.4 prior to next visit

## 2011-08-04 NOTE — Progress Notes (Signed)
  Subjective:    Patient ID: [name], male    DOB: October 08, 1971, 40 y.o.   MRN: [id number]  HPI Pt presents to clinic for followup of multiple medical problems. Notes variability of fsbs 29-599. States no sx's of hypoglycemia with low sugars initially. Glucometer over 46year old but used another glucometer with similar results.  Taking lantus 25 units qhs with qac sliding scale for novolog. Previously followed by endocrine. H/o right thyroid nodules x3 reportedly s/p neg bx. Last [place] ~ several years ago. Complains of fatigue.  Past Medical History  Diagnosis Date  . Drug abuse     7 -8 years ago  . Depression     [facility]  . Diabetes mellitus     diagnosed 14 years ago  . Headache     sight/sound sensitvity  . GERD (gastroesophageal reflux disease)     onset age 43  . Allergic rhinitis     year round  . Hypertension     diagnosed age 23  . Hyperlipidemia     dx 10 years ago   Past Surgical History  Procedure Date  . No past surgeries     denies surgical history    reports that he has been smoking Cigarettes.  He has been smoking about 1 pack per day. He does not have any smokeless tobacco history on file. He reports that he does not drink alcohol or use illicit drugs. family history includes Arthritis in his father and unspecified family member; Colon cancer in his paternal grandmother; Diabetes in his father and maternal grandmother; Drug abuse in his brother, father, and mother; Heart disease in his father, maternal grandfather, and paternal grandfather; Hyperlipidemia in his father, maternal grandfather, maternal grandmother, paternal grandfather, and paternal grandmother; Hypertension in his father and unspecified family member; and Stroke in his father and maternal grandmother. No Known Allergies   Review of Systems see hpi     Objective:   Physical Exam  Physical Exam  Nursing note and vitals reviewed. Constitutional:  Appears well-developed and well-nourished. No distress.  HENT:  Head: Normocephalic and atraumatic.  Right Ear: External ear normal.  Left Ear: External ear normal.  Eyes: Conjunctivae are normal. No scleral icterus.  Neck: Neck supple. Carotid bruit is not present.  Cardiovascular: Normal rate, regular rhythm and normal heart sounds.  Exam reveals no gallop and no friction rub.   No murmur heard. Pulmonary/Chest: Effort normal and breath sounds normal. No respiratory distress. He has no wheezes. no rales.  Lymphadenopathy:    He has no cervical adenopathy.  Neurological:Alert.  Skin: Skin is warm and dry. Not diaphoretic.  Psychiatric: Has a normal mood and affect.        Assessment & Plan:

## 2011-08-05 ENCOUNTER — Other Ambulatory Visit: Payer: Self-pay | Admitting: Internal Medicine

## 2011-08-05 ENCOUNTER — Other Ambulatory Visit (HOSPITAL_BASED_OUTPATIENT_CLINIC_OR_DEPARTMENT_OTHER): Payer: 59

## 2011-08-05 DIAGNOSIS — E785 Hyperlipidemia, unspecified: Secondary | ICD-10-CM

## 2011-08-06 DIAGNOSIS — R5383 Other fatigue: Secondary | ICD-10-CM | POA: Insufficient documentation

## 2011-08-06 DIAGNOSIS — E041 Nontoxic single thyroid nodule: Secondary | ICD-10-CM | POA: Insufficient documentation

## 2011-08-06 NOTE — Assessment & Plan Note (Signed)
Schedule thyroid US

## 2011-08-06 NOTE — Assessment & Plan Note (Signed)
Obtain chem7 and a1c. Consider endocrinology referral

## 2011-08-06 NOTE — Assessment & Plan Note (Signed)
Obtain cbc, tsh, free t4, lft, chem7

## 2011-09-01 NOTE — H&P (Signed)
[name], [name] NO.:  [id number]  MEDICAL RECORD NO.:  [id number]  LOCATION:  1308                         FACILITY:  [facility]  PHYSICIAN:  Mark Sell, MD DATE OF BIRTH:  15-Oct-1971  DATE OF ADMISSION:  06/04/2011 DATE OF DISCHARGE:                             HISTORY & PHYSICAL   PRIMARY CARE PHYSICIAN:  Mark Hair. Artist Pais, DO  CHIEF COMPLAINT:  Right elbow pain and swelling.  HISTORY OF PRESENT ILLNESS:  This is a pleasant 40 year old gentleman with a history of diabetes, hypertension, prior substance abuse, migraine headaches, hyperlipidemia and depression, who has had increasing redness, pain and swelling on his right elbow for several days now.  He has been seen twice as an outpatient, first time given doxycycline, the second time clindamycin; however, the redness that is marked on each of those visits has continued to expand.  Yesterday, he banged his arm against something and a lot of pus drained.  He presented to [facility] today where he had drainage and culture done.  He was given vancomycin and transferred here.  He reports no fevers, chills, night sweats.  No other lesions on him or his family.  This began initially as a small scab, which he thinks maybe from prior cat scratches or mosquito bites.  He reports his diabetes is rather difficult to control.  He thinks his most recent A1c was 7.9.  PAST MEDICAL HISTORY: 1. Diabetes. 2. Hypertension. 3. Depression. 4. Prior substance abuse. 5. Smoker. 6. Hyperlipidemia. 7. History of GERD. 8. History of migraine headaches.  FAMILY HISTORY:  Positive for drug abuse in his brother, father and mother.  History of colon cancer in a paternal grandfather.  Stroke in his father.  Heart disease in other family members.  SOCIAL HISTORY:  Patient smokes about one pack per day.  He has been doing this since age 39.  He denies any alcohol use.  ALLERGIES:  No known drug  allergies.  MEDICATIONS:  Per his admit reconciliation orders now include: 1. Clindamycin 150 mg 3 capsules four times a day. 2. Amlodipine/benazepril 10/40 once a day. 3. NovoLog 15-25 units three times a day with meals. 4. Enteric-coated aspirin 325 once a day. 5. Sumatriptan 100 mg 1 tablet as needed for migraine. 6. Hydrocodone/APAP 5/325 two tablets every 4 hours as needed. 7. Crestor 20 mg 2 tablets daily. 8. Lantus 25 units q.h.s. 9. Nexium 40 mg once a day. 10.Cymbalta 60 mg once a day. 11.Cetirizine 10 mg daily.  REVIEW OF SYSTEMS:  Eleven systems reviewed and negative except as per HPI.  PHYSICAL EXAMINATION:  VITAL SIGNS:  Temperature 97.6, pulse 78, blood pressure 106/72, respirations 14, satting 97% on room air, weight 92 kg. GENERAL:  He is a pleasant white male in no acute distress. HEENT:  Pupils are equal, round and reactive to light and accommodation. Extraocular movements are intact.  Sclerae anicteric.  Oropharynx clear. NECK:  Supple. HEART:  Regular. LUNGS:  Clear. ABDOMEN:  Soft, nontender, nondistended.  No hepatosplenomegaly. EXTREMITIES:  In his right elbow, he has an abscess cavity where there is drainage and packing.  There is surrounding erythema and induration.  The redness has receded from the site where the prior marks have been presumably from yesterday.  There is mildly tender to palpation.  There is no streaking.  There is no axillary lymphadenopathy. NEUROLOGIC:  He is alert and oriented x3, grossly nonfocal neuro exam. SKIN:  He has multiple small scabs consistent with old mosquito bites or animal scratches.  LABORATORY DATA:  White blood count 5.1, hemoglobin 12.0, platelets 160,000.  Glucose 163, BUN 19, creatinine 0.8, glucose 251.  Blood cultures done on June 02, 2011 are negative x2.  Cultures are apparently pending on the abscess drainage.  IMPRESSION: 1. Right arm abscess most likely community-acquired Methicillin-     resistant  Staphylococcus aureus. 2. Diabetes, poorly controlled. 3. Hypertension. 4. Prior history of drug abuse. 5. Active smoking.  PLAN: 1. Await culture results from his drainage. 2. Vancomycin dosing per pharmacy. 3. Continue packing changes daily. 4. For his diabetes, we will continue him on his Lantus and NovoLog     dosing. 5. For his blood pressure, we will continue him on his current     medications of benazepril/amlodipine.  We will also continue him on     his Crestor for hyperlipidemia. 6. Dispo:  Patient likely can be discharged once his culture and     sensitivity results are available.  I suspect this will be     community-acquired MRSA and that the most important aspect of his     treatment has been the drainage.     I have instructed him to elevate his arm and possible to decrease     some of the swelling and redness.  I would suggest that if he is     bacteria sensitive on oral agent, he will be switched to that and     continue with wound care for at least another 7 days.     Mark Sell, MD     DPF/MEDQ  D:  06/04/2011  T:  06/04/2011  Job:  [id number]  Electronically Signed by [name] M.D. on 09/01/2011 10:36:00 AM

## 2011-09-29 ENCOUNTER — Other Ambulatory Visit: Payer: Self-pay | Admitting: Internal Medicine

## 2011-09-29 NOTE — Telephone Encounter (Signed)
Rx refill sent to pharmacy. 

## 2011-10-03 ENCOUNTER — Other Ambulatory Visit: Payer: Self-pay | Admitting: *Deleted

## 2011-10-03 MED ORDER — BENAZEPRIL HCL 40 MG PO TABS: 40.0000 mg | ORAL_TABLET | Freq: Every day | ORAL | Status: DC

## 2011-10-03 MED ORDER — AMLODIPINE BESYLATE 10 MG PO TABS: 10.0000 mg | ORAL_TABLET | Freq: Every day | ORAL | Status: DC

## 2011-10-03 NOTE — Telephone Encounter (Signed)
Received call from [facility] pharm requesting 90 day supply refills on: amlodipine and benazepril. Gave verbal for #90 each x no refills.

## 2011-10-16 ENCOUNTER — Encounter (HOSPITAL_BASED_OUTPATIENT_CLINIC_OR_DEPARTMENT_OTHER): Payer: Self-pay | Admitting: Emergency Medicine

## 2011-10-16 ENCOUNTER — Emergency Department (HOSPITAL_BASED_OUTPATIENT_CLINIC_OR_DEPARTMENT_OTHER)
Admission: EM | Admit: 2011-10-16 | Discharge: 2011-10-16 | Disposition: A | Discharge status: Home / Self Care | Payer: 59 | Attending: Emergency Medicine | Admitting: Emergency Medicine

## 2011-10-16 DIAGNOSIS — F3289 Other specified depressive episodes: Secondary | ICD-10-CM | POA: Insufficient documentation

## 2011-10-16 DIAGNOSIS — K219 Gastro-esophageal reflux disease without esophagitis: Secondary | ICD-10-CM | POA: Insufficient documentation

## 2011-10-16 DIAGNOSIS — E785 Hyperlipidemia, unspecified: Secondary | ICD-10-CM | POA: Insufficient documentation

## 2011-10-16 DIAGNOSIS — I10 Essential (primary) hypertension: Secondary | ICD-10-CM | POA: Insufficient documentation

## 2011-10-16 DIAGNOSIS — R221 Localized swelling, mass and lump, neck: Secondary | ICD-10-CM | POA: Insufficient documentation

## 2011-10-16 DIAGNOSIS — R220 Localized swelling, mass and lump, head: Secondary | ICD-10-CM | POA: Insufficient documentation

## 2011-10-16 DIAGNOSIS — L0201 Cutaneous abscess of face: Secondary | ICD-10-CM | POA: Insufficient documentation

## 2011-10-16 DIAGNOSIS — L03211 Cellulitis of face: Secondary | ICD-10-CM | POA: Insufficient documentation

## 2011-10-16 DIAGNOSIS — E119 Type 2 diabetes mellitus without complications: Secondary | ICD-10-CM | POA: Insufficient documentation

## 2011-10-16 DIAGNOSIS — F329 Major depressive disorder, single episode, unspecified: Secondary | ICD-10-CM | POA: Insufficient documentation

## 2011-10-16 MED ORDER — CEPHALEXIN 500 MG PO CAPS: 500.0000 mg | ORAL_CAPSULE | Freq: Four times a day (QID) | ORAL | Status: AC

## 2011-10-16 MED ORDER — CEPHALEXIN 500 MG PO CAPS: 500.0000 mg | ORAL_CAPSULE | Freq: Four times a day (QID) | ORAL | Status: DC

## 2011-10-16 MED ORDER — HYDROCODONE-ACETAMINOPHEN 5-500 MG PO TABS: 1.0000 | ORAL_TABLET | Freq: Four times a day (QID) | ORAL | Status: AC | PRN

## 2011-10-16 MED ORDER — SULFAMETHOXAZOLE-TRIMETHOPRIM 800-160 MG PO TABS: 1.0000 | ORAL_TABLET | Freq: Two times a day (BID) | ORAL | Status: DC

## 2011-10-16 MED ORDER — SULFAMETHOXAZOLE-TRIMETHOPRIM 800-160 MG PO TABS: 1.0000 | ORAL_TABLET | Freq: Two times a day (BID) | ORAL | Status: AC

## 2011-10-16 NOTE — ED Provider Notes (Signed)
History     CSN: [id number] Arrival date & time: 10/16/2011 10:44 AM   First MD Initiated Contact with Patient 10/16/11 1138      Chief Complaint  Patient presents with  . Cellulitis    (Consider location/radiation/quality/duration/timing/severity/associated sxs/prior treatment) HPI Comments: History of recurrent abscesses.  Now with one on the right side of the cheek.  Tried to squeeze and lance, but getting worse.  Patient is a 40 y.o. male presenting with rash.  Rash  This is a recurrent problem. The current episode started more than 2 days ago. The problem has been gradually worsening. The problem is associated with nothing. There has been no fever. The rash is present on the face. The pain is at a severity of 8/10. The pain is moderate. The pain has been constant since onset. Associated symptoms include pain.    Past Medical History  Diagnosis Date  . Drug abuse     7 -8 years ago  . Depression     [facility]  . Diabetes mellitus     diagnosed 14 years ago  . Headache     sight/sound sensitvity  . GERD (gastroesophageal reflux disease)     onset age 28  . Allergic rhinitis     year round  . Hypertension     diagnosed age 67  . Hyperlipidemia     dx 10 years ago    Past Surgical History  Procedure Date  . No past surgeries     denies surgical history    Family History  Problem Relation Age of Onset  . Drug abuse Brother   . Drug abuse Father   . Drug abuse Mother   . Arthritis Father   . Arthritis      maternal and paternal grandaparents  . Colon cancer Paternal Grandmother   . Hyperlipidemia Father   . Hyperlipidemia Maternal Grandfather   . Hyperlipidemia Paternal Grandmother   . Hyperlipidemia Paternal Grandfather   . Hyperlipidemia Maternal Grandmother   . Heart disease Father     4-5 Heart attacks died age 92   . Stroke Father     age 40  . Heart disease Maternal Grandfather   . Heart disease  Paternal Grandfather   . Stroke Maternal Grandmother   . Hypertension Father   . Hypertension      maternal and paternal grandparents  . Diabetes Father     type II  . Diabetes Maternal Grandmother     History  Substance Use Topics  . Smoking status: Current Everyday Smoker -- 0.5 packs/day    Types: Cigarettes  . Smokeless tobacco: Never Used   Comment: 1 ppd-started age [age]  . Alcohol Use: No      Review of Systems  Constitutional: Negative for fever and chills.  HENT: Positive for facial swelling. Negative for neck pain and neck stiffness.   Skin: Positive for rash.    Allergies  Review of patient's allergies indicates no known allergies.  Home Medications   Current Outpatient Rx  Name Route Sig Dispense Refill  . ALCOHOL PREPS PADS  Use daily with insulin injections and glucometer testing. 900 each 0  . AMLODIPINE BESYLATE 10 MG PO TABS Oral Take 1 tablet (10 mg total) by mouth daily. 90 tablet 0  . BC HEADACHE POWDER PO Oral Take 3 Packages by mouth daily.      Marland Kitchen BENAZEPRIL HCL 40 MG PO TABS Oral Take 1 tablet (  40 mg total) by mouth daily. 90 tablet 0  . CRESTOR 40 MG PO TABS  TAKE 1 TABLET (40 MG TOTAL) BY MOUTH DAILY. 30 tablet 3  . DULOXETINE HCL 60 MG PO CPEP Oral Take 60 mg by mouth daily.      Marland Kitchen ESOMEPRAZOLE MAGNESIUM 40 MG PO CPDR Oral Take 1 capsule (40 mg total) by mouth daily before breakfast. 90 capsule 1  . GLUCOSE BLOOD VI STRP  Use to test blood sugar 6-8 times a day as instructed for fluctuating blood sugars. 800 each 0  . INSULIN ASPART 100 UNIT/ML [place] SOLN Subcutaneous Inject into the skin 3 (three) times daily before meals. Inject with meals as per your sliding scale instructions and as needed for correction doses    . INSULIN GLARGINE 100 UNIT/ML [place] SOLN Subcutaneous Inject 25 Units into the skin at bedtime.      . SUMATRIPTAN SUCCINATE 100 MG PO TABS Oral Take 1 tablet (100 mg total) by mouth every 2 (two) hours as needed. For headache 10 tablet 3   . CETIRIZINE HCL 10 MG PO TABS Oral Take 10 mg by mouth daily.        BP 134/86  Pulse 82  Temp(Src) 97.7 F (36.5 C) (Oral)  Resp 20  Ht 5\' 10"  (1.778 m)  Wt 201 lb (91.173 kg)  BMI 28.84 kg/m2  SpO2 100%  Physical Exam  Constitutional: He is oriented to person, place, and time. He appears well-developed and well-nourished.  HENT:  Head: Normocephalic and atraumatic.  Neck: Normal range of motion. Neck supple.  Musculoskeletal: Normal range of motion.  Neurological: He is alert and oriented to person, place, and time.  Skin: He is diaphoretic.       There is a 1 cm round, firm area to the right side of the face near the area of the angle of the mandible.  It is erythematous and ttp.    ED Course  Procedures (including critical care time)  Labs Reviewed - No data to display No results found.   No diagnosis found.    MDM  Appears non-toxic, is afebrile.  Will treat with antibx for suspected mrsa, follow up as needed if worsens.        Geoffery Lyons, MD 10/16/11 [phone]

## 2011-10-16 NOTE — ED Notes (Signed)
Pt has raised inflammed area to right cheek, appears like abcess.  Pt states he has tried to pop it but only gets blood returned.  No known fever.  Very painful for pt, radiating to jaw/teeth and right ear.

## 2011-10-17 ENCOUNTER — Encounter (HOSPITAL_BASED_OUTPATIENT_CLINIC_OR_DEPARTMENT_OTHER): Payer: Self-pay | Admitting: *Deleted

## 2011-10-17 ENCOUNTER — Emergency Department (HOSPITAL_BASED_OUTPATIENT_CLINIC_OR_DEPARTMENT_OTHER)
Admission: EM | Admit: 2011-10-17 | Discharge: 2011-10-17 | Discharge status: Against Medical Advice | Payer: 59 | Attending: Emergency Medicine | Admitting: Emergency Medicine

## 2011-10-17 DIAGNOSIS — R51 Headache: Secondary | ICD-10-CM | POA: Insufficient documentation

## 2011-10-17 NOTE — ED Notes (Signed)
Abscess on the right side of his face x 4 days. Pt tried to open it himself and today it is worse. Red, swollen, painful.

## 2011-11-03 ENCOUNTER — Ambulatory Visit: Payer: 59 | Admitting: Internal Medicine

## 2011-11-10 ENCOUNTER — Encounter: Payer: Self-pay | Admitting: Internal Medicine

## 2011-11-10 ENCOUNTER — Ambulatory Visit (INDEPENDENT_AMBULATORY_CARE_PROVIDER_SITE_OTHER): Payer: 59 | Admitting: Internal Medicine

## 2011-11-10 DIAGNOSIS — E785 Hyperlipidemia, unspecified: Secondary | ICD-10-CM

## 2011-11-10 DIAGNOSIS — E041 Nontoxic single thyroid nodule: Secondary | ICD-10-CM

## 2011-11-10 DIAGNOSIS — E119 Type 2 diabetes mellitus without complications: Secondary | ICD-10-CM

## 2011-11-10 DIAGNOSIS — Z23 Encounter for immunization: Secondary | ICD-10-CM

## 2011-11-10 DIAGNOSIS — R946 Abnormal results of thyroid function studies: Secondary | ICD-10-CM

## 2011-11-10 DIAGNOSIS — J40 Bronchitis, not specified as acute or chronic: Secondary | ICD-10-CM

## 2011-11-10 LAB — HEPATIC FUNCTION PANEL
Albumin: 4.9 g/dL (ref 3.5–5.2)
Indirect Bilirubin: 0.2 mg/dL (ref 0.0–0.9)
Total Bilirubin: 0.3 mg/dL (ref 0.3–1.2)
Total Protein: 7.4 g/dL (ref 6.0–8.3)

## 2011-11-10 LAB — BASIC METABOLIC PANEL
BUN: 19 mg/dL (ref 6–23)
Calcium: 9.9 mg/dL (ref 8.4–10.5)
Chloride: 103 mEq/L (ref 96–112)
Creat: 0.98 mg/dL (ref 0.50–1.35)

## 2011-11-10 LAB — HEMOGLOBIN A1C: Mean Plasma Glucose: 171 mg/dL — ABNORMAL HIGH (ref <117)

## 2011-11-10 LAB — LIPID PANEL
HDL: 56 mg/dL (ref >39)
LDL Cholesterol: 84 mg/dL (ref 0–99)
Triglycerides: 89 mg/dL (ref <150)
VLDL: 18 mg/dL (ref 0–40)

## 2011-11-10 MED ORDER — DOXYCYCLINE HYCLATE 100 MG PO TABS: 100.0000 mg | ORAL_TABLET | Freq: Two times a day (BID) | ORAL | Status: AC

## 2011-11-13 ENCOUNTER — Other Ambulatory Visit: Payer: Self-pay | Admitting: Internal Medicine

## 2011-11-13 DIAGNOSIS — E119 Type 2 diabetes mellitus without complications: Secondary | ICD-10-CM

## 2011-11-13 DIAGNOSIS — J40 Bronchitis, not specified as acute or chronic: Secondary | ICD-10-CM | POA: Insufficient documentation

## 2011-11-13 DIAGNOSIS — E785 Hyperlipidemia, unspecified: Secondary | ICD-10-CM | POA: Insufficient documentation

## 2011-11-13 NOTE — Assessment & Plan Note (Signed)
Obtain lipid/lft. 

## 2011-11-13 NOTE — Assessment & Plan Note (Signed)
Begin po abx. Followup if no improvement or worsening.  

## 2011-11-13 NOTE — Assessment & Plan Note (Signed)
Consider endocrine consult pending repeat tsh/ft4

## 2011-11-13 NOTE — Assessment & Plan Note (Signed)
Obtain chem7, a1c. Consider endocrinology consult pending results

## 2011-11-13 NOTE — Progress Notes (Signed)
  Subjective:    Patient ID: [name], male    DOB: 11/17/1971, 40 y.o.   MRN: [id number]  HPI Pt presents to clinic for followup of multiple medical problems. Diabetes remains suboptimal complicated by variability. No recent hypoglycemia. Taking novolog intermittently attempting to avoid hypoglycemia. Reviewed thyroid US obtained after last visit and abn tft. Unable to reach pt after last visit for endocrine consult. Notes one +wk h/o cough productive for yellow sputum without wheezing, dyspnea, fever or chills.  Past Medical History  Diagnosis Date  . Drug abuse     7 -8 years ago  . Depression     [facility]  . Diabetes mellitus     diagnosed 14 years ago  . Headache     sight/sound sensitvity  . GERD (gastroesophageal reflux disease)     onset age 51  . Allergic rhinitis     year round  . Hypertension     diagnosed age 62  . Hyperlipidemia     dx 10 years ago   Past Surgical History  Procedure Date  . No past surgeries     denies surgical history    reports that he has been smoking Cigarettes.  He has been smoking about .5 packs per day. He has never used smokeless tobacco. He reports that he does not drink alcohol or use illicit drugs. family history includes Arthritis in his father and unspecified family member; Colon cancer in his paternal grandmother; Diabetes in his father and maternal grandmother; Drug abuse in his brother, father, and mother; Heart disease in his father, maternal grandfather, and paternal grandfather; Hyperlipidemia in his father, maternal grandfather, maternal grandmother, paternal grandfather, and paternal grandmother; Hypertension in his father and unspecified family member; and Stroke in his father and maternal grandmother. No Known Allergies    Review of Systems     Objective:   Physical Exam        Assessment & Plan:

## 2011-12-08 ENCOUNTER — Telehealth: Payer: Self-pay | Admitting: Internal Medicine

## 2011-12-08 MED ORDER — DULOXETINE HCL 60 MG PO CPEP: 60.0000 mg | ORAL_CAPSULE | Freq: Every day | ORAL | Status: DC

## 2011-12-08 NOTE — Telephone Encounter (Signed)
Rx refill sent to pharmacy. 

## 2011-12-26 ENCOUNTER — Other Ambulatory Visit: Payer: Self-pay | Admitting: Internal Medicine

## 2012-01-09 ENCOUNTER — Other Ambulatory Visit: Payer: Self-pay | Admitting: Internal Medicine

## 2012-02-02 ENCOUNTER — Encounter: Payer: Self-pay | Admitting: Internal Medicine

## 2012-02-02 ENCOUNTER — Ambulatory Visit (INDEPENDENT_AMBULATORY_CARE_PROVIDER_SITE_OTHER): Payer: 59 | Admitting: Internal Medicine

## 2012-02-02 VITALS — BP 134/90 | HR 105 | Temp 97.9°F | Resp 18 | Wt 207.1 lb

## 2012-02-02 DIAGNOSIS — J40 Bronchitis, not specified as acute or chronic: Secondary | ICD-10-CM

## 2012-02-02 DIAGNOSIS — M25569 Pain in unspecified knee: Secondary | ICD-10-CM

## 2012-02-02 MED ORDER — HYDROCOD POLST-CHLORPHEN POLST 10-8 MG/5ML PO LQCR: 5.0000 mL | Freq: Two times a day (BID) | ORAL | Status: DC | PRN

## 2012-02-02 MED ORDER — TRAMADOL HCL 50 MG PO TABS: 50.0000 mg | ORAL_TABLET | Freq: Three times a day (TID) | ORAL | Status: AC | PRN

## 2012-02-02 MED ORDER — ROSUVASTATIN CALCIUM 40 MG PO TABS: 40.0000 mg | ORAL_TABLET | Freq: Every day | ORAL | Status: DC

## 2012-02-02 MED ORDER — DOXYCYCLINE HYCLATE 100 MG PO TABS: 100.0000 mg | ORAL_TABLET | Freq: Two times a day (BID) | ORAL | Status: AC

## 2012-02-04 DIAGNOSIS — M25569 Pain in unspecified knee: Secondary | ICD-10-CM | POA: Insufficient documentation

## 2012-02-04 DIAGNOSIS — G8929 Other chronic pain: Secondary | ICD-10-CM | POA: Insufficient documentation

## 2012-02-04 NOTE — Assessment & Plan Note (Signed)
Now experiencing gi upset with nsaids. Attempt ultram prn in sparing manner

## 2012-02-04 NOTE — Assessment & Plan Note (Signed)
Given abx to hold. Begin if sx's do not improve after total duration of 8-10 days. Attempt tussionex prn cough. Followup if no improvement or worsening.

## 2012-02-04 NOTE — Progress Notes (Signed)
  Subjective:    Patient ID: [name], male    DOB: 1971-09-07, 41 y.o.   MRN: [id number]  HPI Pt presents to clinic for evaluation of cough. Notes 5d h/o cough intermittently productive for dark sputum without hemoptysis. Cough worse at night. Now seeing endocrine for dm and thyroid nodules. Requests test strip rf-states testing 6x/day due to labile control. Notes chronic right knee pain secondary to trauma ~20y ago. Failed mobic and is taking otc nsaids with gi upset. No instability. No alleviating or exacerbating factors.   Past Medical History  Diagnosis Date  . Drug abuse     7 -8 years ago  . Depression     [facility]  . Diabetes mellitus     diagnosed 14 years ago  . Headache     sight/sound sensitvity  . GERD (gastroesophageal reflux disease)     onset age 3  . Allergic rhinitis     year round  . Hypertension     diagnosed age 27  . Hyperlipidemia     dx 10 years ago   Past Surgical History  Procedure Date  . No past surgeries     denies surgical history    reports that he has been smoking Cigarettes.  He has been smoking about .5 packs per day. He has never used smokeless tobacco. He reports that he does not drink alcohol or use illicit drugs. family history includes Arthritis in his father and unspecified family member; Colon cancer in his paternal grandmother; Diabetes in his father and maternal grandmother; Drug abuse in his brother, father, and mother; Heart disease in his father, maternal grandfather, and paternal grandfather; Hyperlipidemia in his father, maternal grandfather, maternal grandmother, paternal grandfather, and paternal grandmother; Hypertension in his father and unspecified family member; and Stroke in his father and maternal grandmother. No Known Allergies   Review of Systems see hpi     Objective:   Physical Exam  Nursing note and vitals reviewed. Constitutional: He appears well-developed and  well-nourished. No distress.  HENT:  Head: Normocephalic and atraumatic.  Right Ear: Tympanic membrane, external ear and ear canal normal.  Left Ear: Tympanic membrane, external ear and ear canal normal.  Nose: Nose normal.  Mouth/Throat: Oropharynx is clear and moist. No oropharyngeal exudate.  Eyes: Conjunctivae are normal.  Neck: Neck supple.  Cardiovascular: Normal rate, regular rhythm and normal heart sounds.   Pulmonary/Chest: Effort normal and breath sounds normal. No respiratory distress. He has no wheezes. He has no rales.  Musculoskeletal:       Gait nl  Lymphadenopathy:    He has no cervical adenopathy.  Neurological: He is alert.  Skin: Skin is warm and dry. He is not diaphoretic.          Assessment & Plan:

## 2012-02-09 ENCOUNTER — Ambulatory Visit: Payer: 59 | Admitting: Internal Medicine

## 2012-02-21 ENCOUNTER — Encounter: Payer: Self-pay | Admitting: Internal Medicine

## 2012-02-21 ENCOUNTER — Ambulatory Visit (INDEPENDENT_AMBULATORY_CARE_PROVIDER_SITE_OTHER): Payer: 59 | Admitting: Internal Medicine

## 2012-02-21 VITALS — BP 118/80 | HR 100 | Temp 97.7°F | Resp 18 | Ht 70.0 in | Wt 205.0 lb

## 2012-02-21 DIAGNOSIS — L0390 Cellulitis, unspecified: Secondary | ICD-10-CM | POA: Insufficient documentation

## 2012-02-21 DIAGNOSIS — L089 Local infection of the skin and subcutaneous tissue, unspecified: Secondary | ICD-10-CM

## 2012-02-21 MED ORDER — AMOXICILLIN-POT CLAVULANATE 875-125 MG PO TABS: 1.0000 | ORAL_TABLET | Freq: Two times a day (BID) | ORAL | Status: AC

## 2012-02-21 MED ORDER — OXYCODONE-ACETAMINOPHEN 5-325 MG PO TABS: 1.0000 | ORAL_TABLET | Freq: Three times a day (TID) | ORAL | Status: AC | PRN

## 2012-02-21 NOTE — Assessment & Plan Note (Signed)
Exam not conclusive for abscess formation. Discussed potential I&D and pt wishes to avoid if possible. Begin augmentin bid x7days. Stop tramadol. Take percocet prn pain. Close f/u in 3 days or sooner if needed.

## 2012-02-21 NOTE — Progress Notes (Signed)
  Subjective:    Patient ID: [name], male    DOB: [birth year], 40 y.o.   MRN: [id number]  HPI Pt presents for evaluation of possible abscess. Notes 2 day h/o right axillary ST mass which has been painful and tender. Denies fever, chills, or drainage. Recalls h/o chronic intermittent skin abscesses in the past but not recently. Took tramadol and bc powder for the pain without improvement. No other alleviating or exacerbating factors.   Past Medical History  Diagnosis Date  . Drug abuse     7 -8 years ago  . Depression     [facility]  . Diabetes mellitus     diagnosed 14 years ago  . Headache     sight/sound sensitvity  . GERD (gastroesophageal reflux disease)     onset age 34  . Allergic rhinitis     year round  . Hypertension     diagnosed age 80  . Hyperlipidemia     dx 10 years ago   Past Surgical History  Procedure Date  . No past surgeries     denies surgical history    reports that he has been smoking Cigarettes.  He has been smoking about .5 packs per day. He has never used smokeless tobacco. He reports that he does not drink alcohol or use illicit drugs. family history includes Arthritis in his father and unspecified family member; Colon cancer in his paternal grandmother; Diabetes in his father and maternal grandmother; Drug abuse in his brother, father, and mother; Heart disease in his father, maternal grandfather, and paternal grandfather; Hyperlipidemia in his father, maternal grandfather, maternal grandmother, paternal grandfather, and paternal grandmother; Hypertension in his father and unspecified family member; and Stroke in his father and maternal grandmother. No Known Allergies   Review of Systems see hpi     Objective:   Physical Exam  Nursing note and vitals reviewed. Constitutional: He appears well-developed and well-nourished. No distress.  HENT:  Head: Normocephalic and atraumatic.  Eyes:  Conjunctivae are normal.  Neurological: He is alert.  Skin: Skin is warm and dry. He is not diaphoretic.       Right axilla- 3cm area of mild redness, tenderness without definitive fluctuance. No expressible discharge.  Psychiatric: He has a normal mood and affect.          Assessment & Plan:

## 2012-02-23 ENCOUNTER — Encounter (HOSPITAL_BASED_OUTPATIENT_CLINIC_OR_DEPARTMENT_OTHER): Payer: Self-pay | Admitting: *Deleted

## 2012-02-23 ENCOUNTER — Emergency Department (HOSPITAL_BASED_OUTPATIENT_CLINIC_OR_DEPARTMENT_OTHER)
Admission: EM | Admit: 2012-02-23 | Discharge: 2012-02-23 | Disposition: A | Discharge status: Home / Self Care | Payer: 59 | Attending: Emergency Medicine | Admitting: Emergency Medicine

## 2012-02-23 DIAGNOSIS — Z794 Long term (current) use of insulin: Secondary | ICD-10-CM | POA: Insufficient documentation

## 2012-02-23 DIAGNOSIS — L0291 Cutaneous abscess, unspecified: Secondary | ICD-10-CM

## 2012-02-23 DIAGNOSIS — E785 Hyperlipidemia, unspecified: Secondary | ICD-10-CM | POA: Insufficient documentation

## 2012-02-23 DIAGNOSIS — F17200 Nicotine dependence, unspecified, uncomplicated: Secondary | ICD-10-CM | POA: Insufficient documentation

## 2012-02-23 DIAGNOSIS — K219 Gastro-esophageal reflux disease without esophagitis: Secondary | ICD-10-CM | POA: Insufficient documentation

## 2012-02-23 DIAGNOSIS — Z79899 Other long term (current) drug therapy: Secondary | ICD-10-CM | POA: Insufficient documentation

## 2012-02-23 DIAGNOSIS — I10 Essential (primary) hypertension: Secondary | ICD-10-CM | POA: Insufficient documentation

## 2012-02-23 DIAGNOSIS — E119 Type 2 diabetes mellitus without complications: Secondary | ICD-10-CM | POA: Insufficient documentation

## 2012-02-23 DIAGNOSIS — IMO0002 Reserved for concepts with insufficient information to code with codable children: Secondary | ICD-10-CM | POA: Insufficient documentation

## 2012-02-23 MED ORDER — DOXYCYCLINE HYCLATE 100 MG PO CAPS: 100.0000 mg | ORAL_CAPSULE | Freq: Two times a day (BID) | ORAL | Status: AC

## 2012-02-23 NOTE — Discharge Instructions (Signed)

## 2012-02-23 NOTE — ED Notes (Addendum)
Pt had office visit with PCP 2 days ago for right axilla skin infection. Was started on Augmentin (has been taking as prescribed). Pt is here for worsening of appearance and pain. Pt also had a fever of 102 today. Last dose percocet was 3 hours ago.

## 2012-02-23 NOTE — ED Notes (Signed)
abcess noted to r upper arm

## 2012-02-23 NOTE — ED Provider Notes (Signed)
History     CSN: [id number]  Arrival date & time 02/23/12  1918   First MD Initiated Contact with Patient 02/23/12 1927      Chief Complaint  Patient presents with  . Abscess    (Consider location/radiation/quality/duration/timing/severity/associated sxs/prior treatment) HPI Comments: Pt states that he has history of abscess:pt states that he was seen 2 days ago by his pcp with augmentin and pain medication:pt states that the symptoms have gotten worse  Patient is a 41 y.o. male presenting with abscess. The history is provided by the patient. No language interpreter was used.  Abscess  This is a new problem. The current episode started less than one week ago. The problem occurs continuously. The problem has been gradually worsening. Affected Location: right axilla. The abscess is characterized by redness, swelling and painfulness. There were no sick contacts. Recently, medical care has been given by the PCP. Services received include medications given.    Past Medical History  Diagnosis Date  . Drug abuse     7 -8 years ago  . Depression     [facility]  . Diabetes mellitus     diagnosed 14 years ago  . Headache     sight/sound sensitvity  . GERD (gastroesophageal reflux disease)     onset age 38  . Allergic rhinitis     year round  . Hypertension     diagnosed age 26  . Hyperlipidemia     dx 10 years ago    Past Surgical History  Procedure Date  . No past surgeries     denies surgical history    Family History  Problem Relation Age of Onset  . Drug abuse Brother   . Drug abuse Father   . Drug abuse Mother   . Arthritis Father   . Arthritis      maternal and paternal grandaparents  . Colon cancer Paternal Grandmother   . Hyperlipidemia Father   . Hyperlipidemia Maternal Grandfather   . Hyperlipidemia Paternal Grandmother   . Hyperlipidemia Paternal Grandfather   . Hyperlipidemia Maternal Grandmother   .  Heart disease Father     4-5 Heart attacks died age 76   . Stroke Father     age 86  . Heart disease Maternal Grandfather   . Heart disease Paternal Grandfather   . Stroke Maternal Grandmother   . Hypertension Father   . Hypertension      maternal and paternal grandparents  . Diabetes Father     type II  . Diabetes Maternal Grandmother     History  Substance Use Topics  . Smoking status: Current Everyday Smoker -- 0.5 packs/day    Types: Cigarettes  . Smokeless tobacco: Never Used   Comment: 1 ppd-started age [age]  . Alcohol Use: No      Review of Systems  Constitutional: Negative.   Respiratory: Negative.   Cardiovascular: Negative.   Skin: Positive for wound.  Psychiatric/Behavioral: Negative.     Allergies  Review of patient's allergies indicates no known allergies.  Home Medications   Current Outpatient Rx  Name Route Sig Dispense Refill  . ALCOHOL PREPS PADS  Use daily with insulin injections and glucometer testing. 900 each 0  . AMLODIPINE BESYLATE 10 MG PO TABS Oral Take 1 tablet (10 mg total) by mouth daily. 90 tablet 0  . AMOXICILLIN-POT CLAVULANATE 875-125 MG PO TABS Oral Take 1 tablet by mouth 2 (two) times daily. 14  tablet 0  . BENAZEPRIL HCL 40 MG PO TABS  TAKE ONE TABLET BY MOUTH EVERY DAY 90 tablet 0  . DULOXETINE HCL 60 MG PO CPEP Oral Take 1 capsule (60 mg total) by mouth daily. 30 capsule 6  . GLUCOSE BLOOD VI STRP  Use to test blood sugar 6-8 times a day as instructed for fluctuating blood sugars. 800 each 0  . INSULIN ASPART 100 UNIT/ML [place] SOLN Subcutaneous Inject into the skin 3 (three) times daily before meals. Inject with meals as per your sliding scale instructions and as needed for correction doses    . INSULIN GLARGINE 100 UNIT/ML [place] SOLN Subcutaneous Inject 25 Units into the skin at bedtime.      Marland Kitchen NEXIUM 40 MG PO CPDR  TAKE 1 CAPSULE (40 MG TOTAL) BY MOUTH DAILY BEFORE BREAKFAST. 90 capsule 1  . OXYCODONE-ACETAMINOPHEN 5-325 MG PO TABS  Oral Take 1 tablet by mouth every 8 (eight) hours as needed for pain. 20 tablet 0  . ROSUVASTATIN CALCIUM 40 MG PO TABS Oral Take 1 tablet (40 mg total) by mouth daily. 30 tablet 6  . SUMATRIPTAN SUCCINATE 100 MG PO TABS Oral Take 1 tablet (100 mg total) by mouth every 2 (two) hours as needed. For headache 10 tablet 3    BP 125/74  Pulse 118  Temp(Src) 98.1 F (36.7 C) (Oral)  Resp 18  Ht 5\' 10"  (1.778 m)  Wt 205 lb (92.987 kg)  BMI 29.41 kg/m2  SpO2 98%  Physical Exam  Nursing note and vitals reviewed. Constitutional: He appears well-developed and well-nourished.  Cardiovascular: Normal rate and regular rhythm.   Pulmonary/Chest: Effort normal and breath sounds normal.  Musculoskeletal: Normal range of motion.  Neurological: He is alert.  Skin:       Pt has large red swollen fluctuant area to the right axilla  Psychiatric: He has a normal mood and affect.    ED Course  INCISION AND DRAINAGE Performed by: Teressa Lower Authorized by: Teressa Lower Consent: Verbal consent obtained. Written consent not obtained. Risks and benefits: risks, benefits and alternatives were discussed Consent given by: patient Patient understanding: patient states understanding of the procedure being performed Patient identity confirmed: verbally with patient Time out: Immediately prior to procedure a "time out" was called to verify the correct patient, procedure, equipment, support staff and site/side marked as required. Type: abscess Body area: upper extremity (right axilla) Anesthesia: local infiltration Local anesthetic: lidocaine 2% without epinephrine Scalpel size: 11 Incision type: single straight Drainage: purulent Drainage amount: moderate Wound treatment: wound left open Packing material: 1/4 in iodoform gauze Patient tolerance: Patient tolerated the procedure well with no immediate complications.   (including critical care time)  Labs Reviewed - No data to display No  results found.   1. Abscess       MDM  I&D done without any problem:will switch antibiotics:pt given instructions        Teressa Lower, NP 02/23/12 2010

## 2012-02-23 NOTE — ED Provider Notes (Signed)
Medical screening examination/treatment/procedure(s) were performed by non-physician practitioner and as supervising physician I was immediately available for consultation/collaboration.   Lacie Landry A Kazuko Clemence, MD 02/23/12 2320 

## 2012-02-24 ENCOUNTER — Ambulatory Visit: Payer: 59 | Admitting: Internal Medicine

## 2012-03-02 ENCOUNTER — Emergency Department (HOSPITAL_BASED_OUTPATIENT_CLINIC_OR_DEPARTMENT_OTHER)
Admission: EM | Admit: 2012-03-02 | Discharge: 2012-03-02 | Disposition: A | Discharge status: Home / Self Care | Payer: 59 | Attending: Emergency Medicine | Admitting: Emergency Medicine

## 2012-03-02 ENCOUNTER — Encounter (HOSPITAL_BASED_OUTPATIENT_CLINIC_OR_DEPARTMENT_OTHER): Payer: Self-pay | Admitting: *Deleted

## 2012-03-02 DIAGNOSIS — I10 Essential (primary) hypertension: Secondary | ICD-10-CM | POA: Insufficient documentation

## 2012-03-02 DIAGNOSIS — L02411 Cutaneous abscess of right axilla: Secondary | ICD-10-CM

## 2012-03-02 DIAGNOSIS — E119 Type 2 diabetes mellitus without complications: Secondary | ICD-10-CM | POA: Insufficient documentation

## 2012-03-02 DIAGNOSIS — K219 Gastro-esophageal reflux disease without esophagitis: Secondary | ICD-10-CM | POA: Insufficient documentation

## 2012-03-02 DIAGNOSIS — F17200 Nicotine dependence, unspecified, uncomplicated: Secondary | ICD-10-CM | POA: Insufficient documentation

## 2012-03-02 DIAGNOSIS — Z794 Long term (current) use of insulin: Secondary | ICD-10-CM | POA: Insufficient documentation

## 2012-03-02 DIAGNOSIS — E785 Hyperlipidemia, unspecified: Secondary | ICD-10-CM | POA: Insufficient documentation

## 2012-03-02 DIAGNOSIS — IMO0002 Reserved for concepts with insufficient information to code with codable children: Secondary | ICD-10-CM | POA: Insufficient documentation

## 2012-03-02 DIAGNOSIS — Z79899 Other long term (current) drug therapy: Secondary | ICD-10-CM | POA: Insufficient documentation

## 2012-03-02 MED ORDER — LIDOCAINE HCL 2 % IJ SOLN
INTRAMUSCULAR | Status: DC
Start: 2012-03-02 — End: 2012-03-02
  Filled 2012-03-02: qty 1

## 2012-03-02 MED ORDER — SULFAMETHOXAZOLE-TRIMETHOPRIM 800-160 MG PO TABS: 1.0000 | ORAL_TABLET | Freq: Two times a day (BID) | ORAL | Status: AC

## 2012-03-02 MED ORDER — OXYCODONE-ACETAMINOPHEN 5-325 MG PO TABS
2.0000 | ORAL_TABLET | Freq: Once | ORAL | Status: AC
  Administered 2012-03-02: 2 via ORAL
  Filled 2012-03-02: qty 2

## 2012-03-02 MED ORDER — OXYCODONE-ACETAMINOPHEN 5-325 MG PO TABS: 2.0000 | ORAL_TABLET | ORAL | Status: AC | PRN

## 2012-03-02 NOTE — Discharge Instructions (Signed)

## 2012-03-02 NOTE — ED Provider Notes (Signed)
History     CSN: [id number]  Arrival date & time 03/02/12  [name]   First MD Initiated Contact with Patient 03/02/12 1930      Chief Complaint  Patient presents with  . Arm Pain    (Consider location/radiation/quality/duration/timing/severity/associated sxs/prior treatment) Patient is a 41 y.o. male presenting with arm pain and abscess. The history is provided by the patient. No language interpreter was used.  Arm Pain This is a recurrent problem. The current episode started in the past 7 days. The problem occurs constantly. The problem has been unchanged. The symptoms are aggravated by nothing. Treatments tried: i and d. The treatment provided moderate relief.  Abscess  This is a recurrent problem. The current episode started less than one week ago. The problem has been gradually worsening.  Pt had an i and d here last week.  The area has swelled up again and is painful  Past Medical History  Diagnosis Date  . Drug abuse     7 -8 years ago  . Depression     [facility]  . Diabetes mellitus     diagnosed 14 years ago  . Headache     sight/sound sensitvity  . GERD (gastroesophageal reflux disease)     onset age 51  . Allergic rhinitis     year round  . Hypertension     diagnosed age 2  . Hyperlipidemia     dx 10 years ago    Past Surgical History  Procedure Date  . No past surgeries     denies surgical history    Family History  Problem Relation Age of Onset  . Drug abuse Brother   . Drug abuse Father   . Drug abuse Mother   . Arthritis Father   . Arthritis      maternal and paternal grandaparents  . Colon cancer Paternal Grandmother   . Hyperlipidemia Father   . Hyperlipidemia Maternal Grandfather   . Hyperlipidemia Paternal Grandmother   . Hyperlipidemia Paternal Grandfather   . Hyperlipidemia Maternal Grandmother   . Heart disease Father     4-5 Heart attacks died age 78   . Stroke Father     age 63  .  Heart disease Maternal Grandfather   . Heart disease Paternal Grandfather   . Stroke Maternal Grandmother   . Hypertension Father   . Hypertension      maternal and paternal grandparents  . Diabetes Father     type II  . Diabetes Maternal Grandmother     History  Substance Use Topics  . Smoking status: Current Everyday Smoker -- 0.5 packs/day    Types: Cigarettes  . Smokeless tobacco: Never Used   Comment: 1 ppd-started age [age]  . Alcohol Use: No      Review of Systems  Skin: Positive for wound.  All other systems reviewed and are negative.    Allergies  Review of patient's allergies indicates no known allergies.  Home Medications   Current Outpatient Rx  Name Route Sig Dispense Refill  . ALCOHOL PREPS PADS  Use daily with insulin injections and glucometer testing. 900 each 0  . AMLODIPINE BESYLATE 10 MG PO TABS Oral Take 1 tablet (10 mg total) by mouth daily. 90 tablet 0  . BC HEADACHE POWDER PO Oral Take 1 packet by mouth. Patient used this medication for pain.    Marland Kitchen BENAZEPRIL HCL 40 MG PO TABS  TAKE ONE TABLET BY  MOUTH EVERY DAY 90 tablet 0  . ZYRTEC PO Oral Take 1 tablet by mouth daily as needed. Patient used this medication for his allergies.    Marland Kitchen DOXYCYCLINE HYCLATE 100 MG PO CAPS Oral Take 1 capsule (100 mg total) by mouth 2 (two) times daily. 14 capsule 0  . DULOXETINE HCL 60 MG PO CPEP Oral Take 1 capsule (60 mg total) by mouth daily. 30 capsule 6  . GLUCOSE BLOOD VI STRP  Use to test blood sugar 6-8 times a day as instructed for fluctuating blood sugars. 800 each 0  . INSULIN ASPART 100 UNIT/ML [place] SOLN Subcutaneous Inject into the skin 3 (three) times daily before meals. Inject with meals as per your sliding scale instructions and as needed for correction doses    . INSULIN GLARGINE 100 UNIT/ML [place] SOLN Subcutaneous Inject 25 Units into the skin at bedtime.      Marland Kitchen NEXIUM 40 MG PO CPDR  TAKE 1 CAPSULE (40 MG TOTAL) BY MOUTH DAILY BEFORE BREAKFAST. 90 capsule 1    . ROSUVASTATIN CALCIUM 40 MG PO TABS Oral Take 1 tablet (40 mg total) by mouth daily. 30 tablet 6  . SUMATRIPTAN SUCCINATE 100 MG PO TABS Oral Take 1 tablet (100 mg total) by mouth every 2 (two) hours as needed. For headache 10 tablet 3  . AMOXICILLIN-POT CLAVULANATE 875-125 MG PO TABS Oral Take 1 tablet by mouth 2 (two) times daily. 14 tablet 0  . OXYCODONE-ACETAMINOPHEN 5-325 MG PO TABS Oral Take 2 tablets by mouth every 4 (four) hours as needed for pain. 15 tablet 0  . OXYCODONE-ACETAMINOPHEN 5-325 MG PO TABS Oral Take 1 tablet by mouth every 8 (eight) hours as needed for pain. 20 tablet 0  . SULFAMETHOXAZOLE-TRIMETHOPRIM 800-160 MG PO TABS Oral Take 1 tablet by mouth every 12 (twelve) hours. 14 tablet 0    BP 146/96  Pulse 83  Temp(Src) 97.7 F (36.5 C) (Oral)  Resp 20  SpO2 98%  Physical Exam  Vitals reviewed. Constitutional: He is oriented to person, place, and time. He appears well-developed and well-nourished.  HENT:  Head: Normocephalic.  Eyes: Conjunctivae are normal. Pupils are equal, round, and reactive to light.  Neck: Normal range of motion. Neck supple.  Cardiovascular: Normal rate.   Pulmonary/Chest: Effort normal.  Abdominal: Soft.  Musculoskeletal: Normal range of motion.  Neurological: He is alert and oriented to person, place, and time. He has normal reflexes.  Skin: There is erythema.       Swollen fluctuant area right axilla  Psychiatric: He has a normal mood and affect.    ED Course  INCISION AND DRAINAGE Date/Time: 03/02/2012 8:50 PM Performed by: Cheron Schaumann K Authorized by: Cheron Schaumann K Risks and benefits: risks, benefits and alternatives were discussed Consent given by: patient Patient understanding: patient does not state understanding of the procedure being performed Required items: required blood products, implants, devices, and special equipment available Patient identity confirmed: verbally with patient Time out: Immediately prior to  procedure a "time out" was called to verify the correct patient, procedure, equipment, support staff and site/side marked as required. Type: abscess Body area: upper extremity Anesthesia: local infiltration Scalpel size: 11 Incision type: single straight Drainage: purulent Drainage amount: moderate Wound treatment: wound left open Packing material: 1/4 in iodoform gauze Patient tolerance: Patient tolerated the procedure well with no immediate complications.   (including critical care time)  Labs Reviewed - No data to display No results found.   1. Abscess of right  axilla       MDM          Elson Areas, [place] 03/02/12 2052

## 2012-03-02 NOTE — ED Notes (Signed)
Seen by his MD a week ago for knot under his right arm. I&D here. Here for knots and swelling under his arm that are sore.

## 2012-03-03 NOTE — ED Provider Notes (Signed)
Medical screening examination/treatment/procedure(s) were performed by non-physician practitioner and as supervising physician I was immediately available for consultation/collaboration.   Forbes Cellar, MD 03/03/12 [phone]

## 2012-04-05 ENCOUNTER — Other Ambulatory Visit: Payer: Self-pay | Admitting: Internal Medicine

## 2012-04-05 ENCOUNTER — Other Ambulatory Visit: Payer: Self-pay | Admitting: Family

## 2012-04-05 NOTE — Telephone Encounter (Signed)
Rx refill sent to pharmacy. 

## 2012-04-05 NOTE — Telephone Encounter (Signed)
Ok #30 rf1. Try to avoid taking with imitrex

## 2012-04-19 ENCOUNTER — Telehealth: Payer: Self-pay | Admitting: Internal Medicine

## 2012-04-19 MED ORDER — INSULIN GLARGINE 100 UNIT/ML ~~LOC~~ SOLN: 25.0000 [IU] | Freq: Every day | SUBCUTANEOUS | Status: DC

## 2012-04-19 NOTE — Telephone Encounter (Signed)
Refill- lantus solo pfs 5x53ml. Inject 25 units every night at bedtime. Qty 30 last fill 1.17.13

## 2012-04-19 NOTE — Telephone Encounter (Signed)
Rx refill sent to pharmacy. 

## 2012-04-26 ENCOUNTER — Encounter: Payer: Self-pay | Admitting: Internal Medicine

## 2012-04-26 ENCOUNTER — Ambulatory Visit (INDEPENDENT_AMBULATORY_CARE_PROVIDER_SITE_OTHER): Payer: 59 | Admitting: Internal Medicine

## 2012-04-26 ENCOUNTER — Telehealth: Payer: Self-pay | Admitting: Internal Medicine

## 2012-04-26 VITALS — BP 100/80 | HR 95 | Temp 97.8°F | Resp 18 | Ht 70.0 in | Wt 206.0 lb

## 2012-04-26 DIAGNOSIS — H9190 Unspecified hearing loss, unspecified ear: Secondary | ICD-10-CM

## 2012-04-26 DIAGNOSIS — G8929 Other chronic pain: Secondary | ICD-10-CM

## 2012-04-26 DIAGNOSIS — E785 Hyperlipidemia, unspecified: Secondary | ICD-10-CM

## 2012-04-26 DIAGNOSIS — M25569 Pain in unspecified knee: Secondary | ICD-10-CM

## 2012-04-26 MED ORDER — TRAMADOL HCL 50 MG PO TABS: 50.0000 mg | ORAL_TABLET | Freq: Three times a day (TID) | ORAL | Status: DC | PRN

## 2012-04-26 NOTE — Telephone Encounter (Signed)
Lab order entered for 05/03/2012.

## 2012-04-26 NOTE — Telephone Encounter (Signed)
Please schedule fasting labs for next Thursday  Lipid/lft 272.4  Patient will be going to [facility] lab

## 2012-04-26 NOTE — Patient Instructions (Signed)
Please schedule fasting labs for next Thursday Lipid/lft 272.4

## 2012-04-29 ENCOUNTER — Encounter: Payer: Self-pay | Admitting: Internal Medicine

## 2012-04-29 DIAGNOSIS — H9190 Unspecified hearing loss, unspecified ear: Secondary | ICD-10-CM | POA: Insufficient documentation

## 2012-04-29 NOTE — Assessment & Plan Note (Signed)
rf ultram. Orthopedic consult

## 2012-04-29 NOTE — Assessment & Plan Note (Signed)
With associated ear pain. ENT consult

## 2012-04-29 NOTE — Assessment & Plan Note (Signed)
Obtain lipid/lft. 

## 2012-04-29 NOTE — Progress Notes (Signed)
  Subjective:    Patient ID: [name], male    DOB: 06-Jul-1971, 41 y.o.   MRN: [id number]  HPI Pt presents to clinic for followup of multiple medical problems. Suffered from chronic knee pain despite ultram prn. Applying for disability due to poorly controlled dm and knee pain. Notes chronic bilateral ear pain and decreased hearing left ear. Quit smoking one week ago. Seeing endocrinology for f/u of dm.  Past Medical History  Diagnosis Date  . Drug abuse     7 -8 years ago  . Depression     [facility]- [facility]  . Diabetes mellitus     diagnosed 14 years ago  . Headache     sight/sound sensitvity  . GERD (gastroesophageal reflux disease)     onset age 89  . Allergic rhinitis     year round  . Hypertension     diagnosed age 2  . Hyperlipidemia     dx 10 years ago   Past Surgical History  Procedure Date  . No past surgeries     denies surgical history    reports that he quit smoking 10 days ago. His smoking use included Cigarettes. He smoked .5 packs per day. He has never used smokeless tobacco. He reports that he does not drink alcohol or use illicit drugs. family history includes Arthritis in his father and unspecified family member; Colon cancer in his paternal grandmother; Diabetes in his father and maternal grandmother; Drug abuse in his brother, father, and mother; Heart disease in his father, maternal grandfather, and paternal grandfather; Hyperlipidemia in his father, maternal grandfather, maternal grandmother, paternal grandfather, and paternal grandmother; Hypertension in his father and unspecified family member; and Stroke in his father and maternal grandmother. No Known Allergies    Review of Systems see hpi     Objective:   Physical Exam  Nursing note and vitals reviewed. Constitutional: He appears well-developed and well-nourished. No distress.  HENT:  Head: Normocephalic and atraumatic.  Right Ear: Tympanic membrane,  external ear and ear canal normal.  Left Ear: Tympanic membrane, external ear and ear canal normal.  Eyes: Conjunctivae are normal. No scleral icterus.  Neurological: He is alert.  Skin: He is not diaphoretic.  Psychiatric: He has a normal mood and affect.          Assessment & Plan:

## 2012-05-31 ENCOUNTER — Telehealth: Payer: Self-pay | Admitting: *Deleted

## 2012-05-31 NOTE — Telephone Encounter (Signed)
Received call from pt wanting to know if we have received FMLA paperwork for his wife, Renay. He states that we have completed papers before for his wife's job on his behalf due to his diabetes (hypoglycemic events) as she helps care for him during these episodes. Will check status and call pt tomorrow.

## 2012-06-01 NOTE — Telephone Encounter (Signed)
Do not have record of receiving FMLA paperwork, notified pt and he will request forms again.

## 2012-06-14 ENCOUNTER — Other Ambulatory Visit: Payer: Self-pay | Admitting: Internal Medicine

## 2012-06-14 ENCOUNTER — Other Ambulatory Visit: Payer: Self-pay | Admitting: Family Medicine

## 2012-06-14 NOTE — Telephone Encounter (Signed)
Done/SLS 

## 2012-06-21 ENCOUNTER — Other Ambulatory Visit: Payer: Self-pay | Admitting: *Deleted

## 2012-06-21 MED ORDER — GLUCOSE BLOOD VI STRP: ORAL_STRIP | Status: DC

## 2012-06-21 MED ORDER — ONETOUCH ULTRA SYSTEM W/DEVICE KIT: 1.0000 | PACK | Freq: Once | Status: AC

## 2012-06-21 NOTE — Telephone Encounter (Signed)
Received fax from [facility] requesting rx for pt's One touch glucometer. States pt's current meter is malfunctioning. Rx sent for meter and test strips.

## 2012-06-28 ENCOUNTER — Encounter: Payer: Self-pay | Admitting: Internal Medicine

## 2012-06-28 ENCOUNTER — Ambulatory Visit (INDEPENDENT_AMBULATORY_CARE_PROVIDER_SITE_OTHER): Payer: 59 | Admitting: Internal Medicine

## 2012-06-28 VITALS — BP 122/86 | HR 91 | Temp 98.1°F | Resp 16 | Wt 206.2 lb

## 2012-06-28 DIAGNOSIS — E785 Hyperlipidemia, unspecified: Secondary | ICD-10-CM

## 2012-06-28 DIAGNOSIS — E1165 Type 2 diabetes mellitus with hyperglycemia: Secondary | ICD-10-CM

## 2012-06-28 DIAGNOSIS — B999 Unspecified infectious disease: Secondary | ICD-10-CM

## 2012-06-28 DIAGNOSIS — IMO0002 Reserved for concepts with insufficient information to code with codable children: Secondary | ICD-10-CM

## 2012-06-28 DIAGNOSIS — E119 Type 2 diabetes mellitus without complications: Secondary | ICD-10-CM

## 2012-06-28 DIAGNOSIS — G8929 Other chronic pain: Secondary | ICD-10-CM

## 2012-06-28 DIAGNOSIS — M25569 Pain in unspecified knee: Secondary | ICD-10-CM

## 2012-06-28 DIAGNOSIS — L089 Local infection of the skin and subcutaneous tissue, unspecified: Secondary | ICD-10-CM

## 2012-06-28 LAB — BASIC METABOLIC PANEL
Calcium: 9.8 mg/dL (ref 8.4–10.5)
Creat: 1 mg/dL (ref 0.50–1.35)
Sodium: 137 mEq/L (ref 135–145)

## 2012-06-28 MED ORDER — SULFAMETHOXAZOLE-TRIMETHOPRIM 800-160 MG PO TABS: 1.0000 | ORAL_TABLET | Freq: Two times a day (BID) | ORAL | Status: AC

## 2012-06-28 MED ORDER — TRAMADOL HCL 50 MG PO TABS: 50.0000 mg | ORAL_TABLET | Freq: Four times a day (QID) | ORAL | Status: DC | PRN

## 2012-06-28 MED ORDER — ESOMEPRAZOLE MAGNESIUM 40 MG PO CPDR: 40.0000 mg | DELAYED_RELEASE_CAPSULE | Freq: Every day | ORAL | Status: DC

## 2012-06-28 NOTE — Progress Notes (Signed)
  Subjective:    Patient ID: [name], male    DOB: February 27, 1971, 41 y.o.   MRN: [id number]  HPI Pt presents to clinic for evaluation of skin infection. Notes chronic intermittent skin infections located diffusely. No drainage/purulence fever or chills. Past abscess culture with no growth. Continues to take ultram prn for chronic knee pain-attempting to avoid narcotics and rarely taking triptan. Last two weeks fsbs range has risen to 300. 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 Followed by endocrinology however has not returned since February.   Past Medical History  Diagnosis Date  . Drug abuse     7 -8 years ago  . Depression     [facility]  . Diabetes mellitus     diagnosed 14 years ago  . Headache     sight/sound sensitvity  . GERD (gastroesophageal reflux disease)     onset age 67  . Allergic rhinitis     year round  . Hypertension     diagnosed age 42  . Hyperlipidemia     dx 10 years ago   Past Surgical History  Procedure Date  . No past surgeries     denies surgical history    reports that he quit smoking about 2 months ago. His smoking use included Cigarettes. He smoked .5 packs per day. He has never used smokeless tobacco. He reports that he does not drink alcohol or use illicit drugs. family history includes Arthritis in his father and unspecified family member; Colon cancer in his paternal grandmother; Diabetes in his father and maternal grandmother; Drug abuse in his brother, father, and mother; Heart disease in his father, maternal grandfather, and paternal grandfather; Hyperlipidemia in his father, maternal grandfather, maternal grandmother, paternal grandfather, and paternal grandmother; Hypertension in his father and unspecified family member; and Stroke in his father and maternal grandmother. No Known Allergies   Review of Systems see hpi     Objective:   Physical Exam  Nursing note and vitals  reviewed. Constitutional: He appears well-developed and well-nourished. No distress.  Neurological: He is alert.  Skin: Skin is warm and dry. He is not diaphoretic.       Scattered areas of erythema without ulceration. No drainage.  Psychiatric: He has a normal mood and affect.          Assessment & Plan:

## 2012-06-29 LAB — HEPATIC FUNCTION PANEL
ALT: 17 U/L (ref 0–53)
AST: 15 U/L (ref 0–37)
Albumin: 4.7 g/dL (ref 3.5–5.2)
Alkaline Phosphatase: 101 U/L (ref 39–117)
Total Bilirubin: 0.7 mg/dL (ref 0.3–1.2)
Total Protein: 7.3 g/dL (ref 6.0–8.3)

## 2012-06-29 LAB — LIPID PANEL
Cholesterol: 191 mg/dL (ref 0–200)
HDL: 39 mg/dL — ABNORMAL LOW (ref >39)
Total CHOL/HDL Ratio: 4.9 Ratio
Triglycerides: 178 mg/dL — ABNORMAL HIGH (ref <150)

## 2012-06-30 NOTE — Assessment & Plan Note (Signed)
rf ultram for prn use.

## 2012-06-30 NOTE — Assessment & Plan Note (Signed)
Recurrent. Begin abx. Obtain nasal swab for mrsa.

## 2012-06-30 NOTE — Assessment & Plan Note (Signed)
Endocrinology referral

## 2012-07-01 LAB — MRSA CULTURE

## 2012-07-13 ENCOUNTER — Ambulatory Visit (INDEPENDENT_AMBULATORY_CARE_PROVIDER_SITE_OTHER): Payer: 59 | Admitting: Internal Medicine

## 2012-07-13 ENCOUNTER — Encounter: Payer: Self-pay | Admitting: Internal Medicine

## 2012-07-13 VITALS — BP 98/78 | HR 122 | Temp 97.4°F | Resp 16 | Wt 202.0 lb

## 2012-07-13 DIAGNOSIS — E785 Hyperlipidemia, unspecified: Secondary | ICD-10-CM

## 2012-07-13 DIAGNOSIS — L089 Local infection of the skin and subcutaneous tissue, unspecified: Secondary | ICD-10-CM

## 2012-07-13 DIAGNOSIS — IMO0002 Reserved for concepts with insufficient information to code with codable children: Secondary | ICD-10-CM

## 2012-07-13 DIAGNOSIS — M792 Neuralgia and neuritis, unspecified: Secondary | ICD-10-CM

## 2012-07-13 MED ORDER — DOXYCYCLINE HYCLATE 100 MG PO TABS: 100.0000 mg | ORAL_TABLET | Freq: Two times a day (BID) | ORAL | Status: AC

## 2012-07-13 MED ORDER — PREGABALIN 50 MG PO CAPS: 50.0000 mg | ORAL_CAPSULE | Freq: Two times a day (BID) | ORAL | Status: DC

## 2012-07-13 MED ORDER — HYDROCODONE-ACETAMINOPHEN 5-500 MG PO TABS: 1.0000 | ORAL_TABLET | Freq: Three times a day (TID) | ORAL | Status: AC | PRN

## 2012-07-13 MED ORDER — MUPIROCIN 2 % EX OINT: TOPICAL_OINTMENT | CUTANEOUS | Status: AC

## 2012-07-15 DIAGNOSIS — M792 Neuralgia and neuritis, unspecified: Secondary | ICD-10-CM | POA: Insufficient documentation

## 2012-07-15 NOTE — Progress Notes (Signed)
  Subjective:    Patient ID: [name], male    DOB: Nov 16, 1971, 41 y.o.   MRN: [id number]  HPI Pt presents to clinic for follow up of skin infection. Most lesions are improved with abx. Right ant lower leg lesion shows more dark eschar and has mild surrounding erythema per pt. No drainage, fever or chills. H/o recurrent skin infections without available cx. Based on suspicion of MRSA nasal swab obtained and is reviewed +MRSA. Reviewed mildly high ldl-states was temporarily off statin. Has endocrinology appt next week for labile DM. C/o chronic pain both msk/joint as well as neuropathic. Ultram not helping enough.  Past Medical History  Diagnosis Date  . Drug abuse     7 -8 years ago  . Depression     [facility]  . Diabetes mellitus     diagnosed 14 years ago  . Headache     sight/sound sensitvity  . GERD (gastroesophageal reflux disease)     onset age 59  . Allergic rhinitis     year round  . Hypertension     diagnosed age 51  . Hyperlipidemia     dx 10 years ago   Past Surgical History  Procedure Date  . No past surgeries     denies surgical history    reports that he quit smoking about 2 months ago. His smoking use included Cigarettes. He smoked .5 packs per day. He has never used smokeless tobacco. He reports that he does not drink alcohol or use illicit drugs. family history includes Arthritis in his father and unspecified family member; Colon cancer in his paternal grandmother; Diabetes in his father and maternal grandmother; Drug abuse in his brother, father, and mother; Heart disease in his father, maternal grandfather, and paternal grandfather; Hyperlipidemia in his father, maternal grandfather, maternal grandmother, paternal grandfather, and paternal grandmother; Hypertension in his father and unspecified family member; and Stroke in his father and maternal grandmother. No Known Allergies   Review of Systems see hpi       Objective:   Physical Exam  Nursing note and vitals reviewed. Constitutional: He appears well-developed and well-nourished. No distress.  HENT:  Head: Normocephalic and atraumatic.  Neurological: He is alert.  Skin: Skin is warm and dry. He is not diaphoretic.       Right ant lower leg-2-3cm shallow sore with eschar. Mild surrounding erythema. No drainage or warmth  Psychiatric: He has a normal mood and affect.          Assessment & Plan:

## 2012-07-15 NOTE — Assessment & Plan Note (Signed)
reattempt lyrica. Change ultram to hydrocodone. Short term only. Understands potential for addiction and/or tolerance.

## 2012-07-15 NOTE — Assessment & Plan Note (Signed)
+  MRSA nasal positive. Repeat po abx course. Change to doxy as septra caused nausea. Begin bactroban to nares.

## 2012-07-17 ENCOUNTER — Ambulatory Visit: Payer: 59 | Admitting: Endocrinology

## 2012-07-24 ENCOUNTER — Telehealth: Payer: Self-pay | Admitting: *Deleted

## 2012-07-24 ENCOUNTER — Ambulatory Visit: Payer: 59 | Admitting: Family

## 2012-07-24 NOTE — Telephone Encounter (Signed)
Patient called about leg wound and schedule appointment to be seen by Sandford Craze tomorrow [09.03.13], refused OV for 09.02.13; made f/u call to patient to triage via phone. Pt reports that he knocked scab off of leg wound x2 days ago and the wound released a drainage w/pus; states that there is a "small amount of additional redness to wound since injury, negative for swelling & heat", no further pus at this time seen in drainage; pt is asymptomatic for infection symptoms [fever, nausea/vomiting, diarrhea, weakness, dyspnea]/SLS

## 2012-07-25 ENCOUNTER — Encounter: Payer: Self-pay | Admitting: Family

## 2012-07-25 ENCOUNTER — Ambulatory Visit (INDEPENDENT_AMBULATORY_CARE_PROVIDER_SITE_OTHER): Payer: 59 | Admitting: Family

## 2012-07-25 VITALS — BP 136/86 | HR 98 | Temp 97.9°F | Resp 16 | Wt 213.0 lb

## 2012-07-25 DIAGNOSIS — S91009A Unspecified open wound, unspecified ankle, initial encounter: Secondary | ICD-10-CM

## 2012-07-25 DIAGNOSIS — E1165 Type 2 diabetes mellitus with hyperglycemia: Secondary | ICD-10-CM

## 2012-07-25 DIAGNOSIS — L089 Local infection of the skin and subcutaneous tissue, unspecified: Secondary | ICD-10-CM

## 2012-07-25 DIAGNOSIS — S81801A Unspecified open wound, right lower leg, initial encounter: Secondary | ICD-10-CM

## 2012-07-25 MED ORDER — HYDROCODONE-ACETAMINOPHEN 5-500 MG PO TABS: 1.0000 | ORAL_TABLET | Freq: Three times a day (TID) | ORAL | Status: AC | PRN

## 2012-07-25 MED ORDER — CLINDAMYCIN HCL 300 MG PO CAPS: 300.0000 mg | ORAL_CAPSULE | Freq: Four times a day (QID) | ORAL | Status: AC

## 2012-07-25 NOTE — Assessment & Plan Note (Signed)
Encouraged pt to keep his upcoming appointment with endocrinology.  We discussed diabetic diet today and patient was given a meal planning guide with portion recommendations.

## 2012-07-25 NOTE — Progress Notes (Signed)
Subjective:    Patient ID: [name], male    DOB: [birth year], 41 y.o.   MRN: [id number]  HPI  Mr.  Santelli is a 41 yr old male with history of uncontrolled DM2 who presents today to discuss non-healing ulcers of the right lower extremity. He reports ulcers have been present x 2 months.  He has a hx of + MRSA nasal swab.  The patient has completed a course of bactrim (though notes that it caused him nausea) and a course of doxycycline (just completed).  Reports that the larger of the two ulcers had started to look and feel better.  However 2 days ago, he started to note some pussy drainage and the area became more painful.  He has been using vicodin prn pain.    DM2- reports that he has an upcoming appointment with Dr. Everardo All.  Reports that he is motivated to get his diabetes under control as he knows her wounds would heal better.  Admits to "dosing for what I eat" rather than adhering to a diabetic diet.   Review of Systems See HPI  Past Medical History  Diagnosis Date  . Drug abuse     7 -8 years ago  . Depression     [facility]  . Diabetes mellitus     diagnosed 14 years ago  . Headache     sight/sound sensitvity  . GERD (gastroesophageal reflux disease)     onset age 29  . Allergic rhinitis     year round  . Hypertension     diagnosed age 37  . Hyperlipidemia     dx 10 years ago    History   Social History  . Marital Status: Married    Spouse Name: N/A    Number of Children: N/A  . Years of Education: N/A   Occupational History  . Not on file.   Social History Main Topics  . Smoking status: Former Smoker -- 0.5 packs/day    Types: Cigarettes    Quit date: 04/19/2012  . Smokeless tobacco: Never Used   Comment: 1 ppd-started age [age]  . Alcohol Use: No  . Drug Use: No  . Sexually Active: Not on file   Other Topics Concern  . Not on file   Social History Narrative   Married, one 37 y/o son, currently  unemployed.+Smoker.  No alc/drugs.    Past Surgical History  Procedure Date  . No past surgeries     denies surgical history    Family History  Problem Relation Age of Onset  . Drug abuse Brother   . Drug abuse Father   . Drug abuse Mother   . Arthritis Father   . Arthritis      maternal and paternal grandaparents  . Colon cancer Paternal Grandmother   . Hyperlipidemia Father   . Hyperlipidemia Maternal Grandfather   . Hyperlipidemia Paternal Grandmother   . Hyperlipidemia Paternal Grandfather   . Hyperlipidemia Maternal Grandmother   . Heart disease Father     4-5 Heart attacks died age 60   . Stroke Father     age 37  . Heart disease Maternal Grandfather   . Heart disease Paternal Grandfather   . Stroke Maternal Grandmother   . Hypertension Father   . Hypertension      maternal and paternal grandparents  . Diabetes Father     type II  . Diabetes Maternal Grandmother  No Known Allergies  Current Outpatient Prescriptions on File Prior to Visit  Medication Sig Dispense Refill  . amLODipine (NORVASC) 10 MG tablet TAKE ONE TABLET BY MOUTH EVERY DAY  90 tablet  1  . Aspirin-Salicylamide-Caffeine (BC HEADACHE POWDER PO) Take 1 packet by mouth. Patient used this medication for pain.      . benazepril (LOTENSIN) 40 MG tablet TAKE ONE TABLET BY MOUTH EVERY DAY  90 tablet  1  . Blood Glucose Monitoring Suppl (ONE TOUCH ULTRA SYSTEM KIT) W/DEVICE KIT 1 kit by Does not apply route once.  1 each  0  . Cetirizine HCl (ZYRTEC PO) Take 1 tablet by mouth daily as needed. Patient used this medication for his allergies.      Marland Kitchen doxycycline (VIBRA-TABS) 100 MG tablet Take 1 tablet (100 mg total) by mouth 2 (two) times daily.  20 tablet  0  . DULoxetine (CYMBALTA) 60 MG capsule Take 1 capsule (60 mg total) by mouth daily.  30 capsule  6  . esomeprazole (NEXIUM) 40 MG capsule Take 1 capsule (40 mg total) by mouth daily.  90 capsule  3  . glucose blood (ONE TOUCH ULTRA TEST) test strip  Use as instructed to check blood sugar 6-8 times a day for fluctuating blood sugars.  200 each  3  . GNP ALCOHOL SWABS 70 % PADS USE DAILY WITH INSULIN INJECTIONS AND GLUCOMETER TESTING.  700 each  0  . insulin aspart (NOVOLOG) 100 UNIT/ML injection Inject into the skin 3 (three) times daily before meals. Inject with meals as per your sliding scale instructions and as needed for correction doses      . insulin glargine (LANTUS SOLOSTAR) 100 UNIT/ML injection Inject 25 Units into the skin at bedtime.  30 mL  3  . NOVOLOG FLEXPEN 100 UNIT/ML injection INJECT WITH MEALS AS PER YOUR SLIDING SCALE INSTRUCTIONS  45 mL  1  . pregabalin (LYRICA) 50 MG capsule Take 1 capsule (50 mg total) by mouth 2 (two) times daily.  60 capsule  3  . rosuvastatin (CRESTOR) 40 MG tablet Take 1 tablet (40 mg total) by mouth daily.  30 tablet  6  . SUMAtriptan (IMITREX) 100 MG tablet Take 1 tablet (100 mg total) by mouth every 2 (two) hours as needed. For headache  10 tablet  3  . traMADol (ULTRAM) 50 MG tablet Take 1 tablet (50 mg total) by mouth every 6 (six) hours as needed for pain. Avoid taking with Imitrex  120 tablet  3    BP 136/86  Pulse 98  Temp 97.9 F (36.6 C) (Oral)  Resp 16  Wt 213 lb (96.616 kg)  SpO2 99%       Objective:   Physical Exam  Constitutional: He appears well-developed and well-nourished. No distress.  Cardiovascular: Normal rate and regular rhythm.   No murmur heard. Pulmonary/Chest: Effort normal and breath sounds normal. No respiratory distress. He has no wheezes. He has no rales. He exhibits no tenderness.  Skin:          1.  Approximately 1 inch scabbed ulcer with scan serous drainage.  Ring of erythema approx half inch wide surrounds the ulcer.    2.  Hyperpigmented nodular induration approx 1 cm wide without surrounding erythema          Assessment & Plan:

## 2012-07-25 NOTE — Patient Instructions (Addendum)
You will be contact about your referral to the wound care center.  Please let us know if you have not heard back within 1 week about your referral. Call if increased pain, swelling, redness or drainage of the sores on your right leg.  Keep your upcoming appointment with Dr. Everardo All.  Please follow up in 1 week.

## 2012-07-25 NOTE — Assessment & Plan Note (Addendum)
Deteriorated. Due to recurrent/non-healing ulcers right leg, will plan to rx with clindamycin and refer to wound clinic for consultation.

## 2012-07-30 ENCOUNTER — Other Ambulatory Visit: Payer: Self-pay | Admitting: *Deleted

## 2012-07-30 MED ORDER — GABAPENTIN 300 MG PO CAPS: 300.0000 mg | ORAL_CAPSULE | Freq: Three times a day (TID) | ORAL | Status: DC

## 2012-07-30 NOTE — Progress Notes (Signed)
Lyrica too expensive per patient, per Vo TWH, start Gabapentin 300 mg TId, D/C Lyrica/[facility]

## 2012-08-02 ENCOUNTER — Ambulatory Visit: Payer: 59 | Admitting: Endocrinology

## 2012-08-29 ENCOUNTER — Other Ambulatory Visit: Payer: Self-pay | Admitting: Internal Medicine

## 2012-09-05 ENCOUNTER — Other Ambulatory Visit: Payer: Self-pay | Admitting: Family Medicine

## 2012-09-11 ENCOUNTER — Ambulatory Visit (INDEPENDENT_AMBULATORY_CARE_PROVIDER_SITE_OTHER): Payer: 59 | Admitting: *Deleted

## 2012-09-11 DIAGNOSIS — Z23 Encounter for immunization: Secondary | ICD-10-CM

## 2012-09-14 ENCOUNTER — Telehealth: Payer: Self-pay | Admitting: Internal Medicine

## 2012-09-14 MED ORDER — INSULIN PEN NEEDLE 31G X 8 MM MISC: Status: DC

## 2012-09-14 NOTE — Telephone Encounter (Signed)
Rx to pharmacy/[facility] 

## 2012-09-14 NOTE — Telephone Encounter (Signed)
Refill- bd pen ndl short 31GX5/16". Inject 4-5 times daily. Qty 500 last fill 1.17.13

## 2012-09-27 ENCOUNTER — Ambulatory Visit: Payer: 59 | Admitting: Internal Medicine

## 2012-10-15 ENCOUNTER — Encounter: Payer: Self-pay | Admitting: Internal Medicine

## 2012-10-15 ENCOUNTER — Ambulatory Visit (INDEPENDENT_AMBULATORY_CARE_PROVIDER_SITE_OTHER): Payer: 59 | Admitting: Internal Medicine

## 2012-10-15 ENCOUNTER — Other Ambulatory Visit: Payer: Self-pay | Admitting: Internal Medicine

## 2012-10-15 VITALS — BP 126/88 | HR 96 | Temp 97.8°F | Resp 18 | Wt 219.0 lb

## 2012-10-15 DIAGNOSIS — E785 Hyperlipidemia, unspecified: Secondary | ICD-10-CM

## 2012-10-15 DIAGNOSIS — R05 Cough: Secondary | ICD-10-CM

## 2012-10-15 DIAGNOSIS — E1165 Type 2 diabetes mellitus with hyperglycemia: Secondary | ICD-10-CM

## 2012-10-15 DIAGNOSIS — E119 Type 2 diabetes mellitus without complications: Secondary | ICD-10-CM

## 2012-10-15 LAB — HEPATIC FUNCTION PANEL
Bilirubin, Direct: 0.1 mg/dL (ref 0.0–0.3)
Indirect Bilirubin: 0.2 mg/dL (ref 0.0–0.9)
Total Protein: 7.3 g/dL (ref 6.0–8.3)

## 2012-10-15 LAB — LIPID PANEL
LDL Cholesterol: 76 mg/dL (ref 0–99)
Triglycerides: 104 mg/dL (ref <150)
VLDL: 21 mg/dL (ref 0–40)

## 2012-10-15 LAB — CBC WITH DIFFERENTIAL/PLATELET
Eosinophils Absolute: 0.1 10*3/uL (ref 0.0–0.7)
Eosinophils Relative: 2 % (ref 0–5)
Hemoglobin: 14.6 g/dL (ref 13.0–17.0)
Lymphs Abs: 1.8 10*3/uL (ref 0.7–4.0)
MCH: 31.4 pg (ref 26.0–34.0)
MCV: 90.3 fL (ref 78.0–100.0)
Monocytes Absolute: 0.4 10*3/uL (ref 0.1–1.0)
Monocytes Relative: 7 % (ref 3–12)
RBC: 4.65 MIL/uL (ref 4.22–5.81)

## 2012-10-15 LAB — BASIC METABOLIC PANEL
CO2: 24 mEq/L (ref 19–32)
Calcium: 9.7 mg/dL (ref 8.4–10.5)
Creat: 1.02 mg/dL (ref 0.50–1.35)
Glucose, Bld: 226 mg/dL — ABNORMAL HIGH (ref 70–99)

## 2012-10-15 MED ORDER — AMLODIPINE BESYLATE 10 MG PO TABS: 10.0000 mg | ORAL_TABLET | Freq: Every day | ORAL | Status: DC

## 2012-10-15 MED ORDER — BENZONATATE 100 MG PO CAPS: 100.0000 mg | ORAL_CAPSULE | Freq: Three times a day (TID) | ORAL | Status: DC | PRN

## 2012-10-15 MED ORDER — BENAZEPRIL HCL 40 MG PO TABS: 40.0000 mg | ORAL_TABLET | Freq: Every day | ORAL | Status: DC

## 2012-10-15 MED ORDER — GABAPENTIN 300 MG PO CAPS: 600.0000 mg | ORAL_CAPSULE | Freq: Three times a day (TID) | ORAL | Status: DC

## 2012-10-15 NOTE — Assessment & Plan Note (Signed)
Obtain lipid/lft. 

## 2012-10-15 NOTE — Progress Notes (Signed)
  Subjective:    Patient ID: [name], male    DOB: Jan 07, 1971, 41 y.o.   MRN: [id number]  HPI Pt presents to clinic for followup of multiple medical problems. States hasn't seen Endocrinology recently in follow up due to financial concerns. Plans to follow up possible early next year. Continues to have difficulty with intermittent hypo and hyperglycemia. States am fasting glucoses typically ~180. Has spikes to 300 pre supper. Does not consistently eat breakfast and varies time of lunch and lunch insulin shot. Intermittently forgets and misses doses of insulin. Notes relative hypoglycemia with 70's sometimes at night and occasional true hypoglycemia at night. Notes intermittent nighttime cough without f/c. Requests tessalon.   Past Medical History  Diagnosis Date  . Drug abuse     7 -8 years ago  . Depression     [facility]  . Diabetes mellitus     diagnosed 14 years ago  . Headache     sight/sound sensitvity  . GERD (gastroesophageal reflux disease)     onset age 63  . Allergic rhinitis     year round  . Hypertension     diagnosed age 25  . Hyperlipidemia     dx 10 years ago   Past Surgical History  Procedure Date  . No past surgeries     denies surgical history    reports that he quit smoking about 5 months ago. His smoking use included Cigarettes. He smoked .5 packs per day. He has never used smokeless tobacco. He reports that he does not drink alcohol or use illicit drugs. family history includes Arthritis in his father and unspecified family member; Colon cancer in his paternal grandmother; Diabetes in his father and maternal grandmother; Drug abuse in his brother, father, and mother; Heart disease in his father, maternal grandfather, and paternal grandfather; Hyperlipidemia in his father, maternal grandfather, maternal grandmother, paternal grandfather, and paternal grandmother; Hypertension in his father and unspecified family  member; and Stroke in his father and maternal grandmother. No Known Allergies    Review of Systems see hpi     Objective:   Physical Exam  Nursing note and vitals reviewed. Constitutional: He appears well-developed and well-nourished. No distress.  HENT:  Head: Normocephalic and atraumatic.  Right Ear: External ear normal.  Left Ear: External ear normal.  Eyes: Conjunctivae normal are normal. No scleral icterus.  Neck: Neck supple.  Cardiovascular: Normal rate, regular rhythm and normal heart sounds.  Exam reveals no gallop and no friction rub.   No murmur heard. Pulmonary/Chest: Effort normal and breath sounds normal. No respiratory distress. He has no wheezes. He has no rales.  Neurological: He is alert.  Skin: He is not diaphoretic.  Psychiatric: He has a normal mood and affect.          Assessment & Plan:

## 2012-10-15 NOTE — Telephone Encounter (Signed)
Rx[s] done at [facility]

## 2012-10-15 NOTE — Assessment & Plan Note (Signed)
Tessalon perles prn. Followup if no improvement or worsening.

## 2012-10-15 NOTE — Assessment & Plan Note (Signed)
Recommend take insulin regularly at same times. Avoid skipping meals. Increase lunch insulin 2-3 units every 3-4 days until pre supper values are under control. Then may be able to decrease supper insulin some to avoid hypoglycemia. At that point increase lantus 3 units every 3 days until am fasting glucose consistently less than 130 without hypoglycemia. Obtain cbc, chem7, a1c, urine microalbumin.

## 2012-10-16 LAB — MICROALBUMIN / CREATININE URINE RATIO
Creatinine, Urine: 103.8 mg/dL
Microalb, Ur: 2.77 mg/dL — ABNORMAL HIGH (ref 0.00–1.89)

## 2012-10-21 ENCOUNTER — Emergency Department (HOSPITAL_BASED_OUTPATIENT_CLINIC_OR_DEPARTMENT_OTHER): Payer: 59

## 2012-10-21 ENCOUNTER — Emergency Department (HOSPITAL_BASED_OUTPATIENT_CLINIC_OR_DEPARTMENT_OTHER)
Admission: EM | Admit: 2012-10-21 | Discharge: 2012-10-21 | Disposition: A | Discharge status: Home / Self Care | Payer: 59 | Attending: Emergency Medicine | Admitting: Emergency Medicine

## 2012-10-21 ENCOUNTER — Encounter (HOSPITAL_BASED_OUTPATIENT_CLINIC_OR_DEPARTMENT_OTHER): Payer: Self-pay | Admitting: Emergency Medicine

## 2012-10-21 DIAGNOSIS — Z8719 Personal history of other diseases of the digestive system: Secondary | ICD-10-CM | POA: Insufficient documentation

## 2012-10-21 DIAGNOSIS — W2209XA Striking against other stationary object, initial encounter: Secondary | ICD-10-CM | POA: Insufficient documentation

## 2012-10-21 DIAGNOSIS — Z79899 Other long term (current) drug therapy: Secondary | ICD-10-CM | POA: Insufficient documentation

## 2012-10-21 DIAGNOSIS — Z87891 Personal history of nicotine dependence: Secondary | ICD-10-CM | POA: Insufficient documentation

## 2012-10-21 DIAGNOSIS — Y939 Activity, unspecified: Secondary | ICD-10-CM | POA: Insufficient documentation

## 2012-10-21 DIAGNOSIS — S8002XA Contusion of left knee, initial encounter: Secondary | ICD-10-CM

## 2012-10-21 DIAGNOSIS — Y929 Unspecified place or not applicable: Secondary | ICD-10-CM | POA: Insufficient documentation

## 2012-10-21 DIAGNOSIS — Z87828 Personal history of other (healed) physical injury and trauma: Secondary | ICD-10-CM | POA: Insufficient documentation

## 2012-10-21 DIAGNOSIS — S8000XA Contusion of unspecified knee, initial encounter: Secondary | ICD-10-CM | POA: Insufficient documentation

## 2012-10-21 DIAGNOSIS — Z8659 Personal history of other mental and behavioral disorders: Secondary | ICD-10-CM | POA: Insufficient documentation

## 2012-10-21 DIAGNOSIS — E785 Hyperlipidemia, unspecified: Secondary | ICD-10-CM | POA: Insufficient documentation

## 2012-10-21 DIAGNOSIS — E119 Type 2 diabetes mellitus without complications: Secondary | ICD-10-CM | POA: Insufficient documentation

## 2012-10-21 DIAGNOSIS — Z794 Long term (current) use of insulin: Secondary | ICD-10-CM | POA: Insufficient documentation

## 2012-10-21 DIAGNOSIS — I10 Essential (primary) hypertension: Secondary | ICD-10-CM | POA: Insufficient documentation

## 2012-10-21 DIAGNOSIS — J309 Allergic rhinitis, unspecified: Secondary | ICD-10-CM | POA: Insufficient documentation

## 2012-10-21 MED ORDER — HYDROCODONE-ACETAMINOPHEN 5-500 MG PO TABS: 1.0000 | ORAL_TABLET | Freq: Four times a day (QID) | ORAL | Status: DC | PRN

## 2012-10-21 MED ORDER — HYDROCODONE-ACETAMINOPHEN 5-325 MG PO TABS
2.0000 | ORAL_TABLET | Freq: Once | ORAL | Status: AC
  Administered 2012-10-21: 2 via ORAL
  Filled 2012-10-21: qty 2
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
## 2012-10-23 ENCOUNTER — Other Ambulatory Visit: Payer: Self-pay | Admitting: Internal Medicine

## 2012-10-24 NOTE — Telephone Encounter (Signed)
In august got 120 with 3 rfs. Has he really used 480 tablets already?

## 2012-10-24 NOTE — Telephone Encounter (Signed)
Please advise re: refills below:  Medication name:  Name from pharmacy:  traMADol (ULTRAM) 50 MG tablet  TRAMADOL HCL 50 MG TABLET TAB 50 MG Sig: TAKE 1 TABLET BY MOUTH EVERY 6 HOURS AS NEEDED FOR PAIN. AVOIDING TAKING WITH IMITREX. Dispense: 120 tablet Refills: 3 Start: 10/23/2012 Class: Normal Requested on: 06/28/2012 Originally ordered on: 04/05/2012 Last refill: 09/24/2012

## 2012-10-24 NOTE — Telephone Encounter (Signed)
Spoke with Phoebe Sharps at [facility]. She states pt received #120 on 07/04/12, 08/02/12, 08/27/12 and 09/24/12. All refills have been used from 06/28/12 Rx. Please advise.

## 2012-10-25 ENCOUNTER — Other Ambulatory Visit: Payer: Self-pay | Admitting: Internal Medicine

## 2012-10-25 DIAGNOSIS — M25569 Pain in unspecified knee: Secondary | ICD-10-CM

## 2012-10-25 NOTE — Telephone Encounter (Signed)
Refill sent to pharmacy. Pt states he has been taking tramadol for his knee pain. Notified pt of need to see a specialist and he voices understanding. Pt states he recently injured his right knee and still has a little swelling. Pt agreeable to proceed with referral.  Advised pt to let us know if he doesn't hear from [place] within 1 week re: referral.

## 2012-10-25 NOTE — Telephone Encounter (Signed)
Using 4 or more every day. Fill #120 no rf. If his ha's or knee pain is that severe then we need to address the pain. Taking 120 ultram a month every month is not the answer. Either ortho for knee or neuro for headaches

## 2012-10-30 ENCOUNTER — Encounter: Payer: Self-pay | Admitting: Family

## 2012-10-30 ENCOUNTER — Ambulatory Visit (INDEPENDENT_AMBULATORY_CARE_PROVIDER_SITE_OTHER): Payer: 59 | Admitting: Family

## 2012-10-30 ENCOUNTER — Ambulatory Visit (HOSPITAL_BASED_OUTPATIENT_CLINIC_OR_DEPARTMENT_OTHER)
Admission: RE | Admit: 2012-10-30 | Discharge: 2012-10-30 | Disposition: A | Discharge status: Home / Self Care | Payer: 59 | Source: Ambulatory Visit | Attending: Family | Admitting: Family

## 2012-10-30 VITALS — BP 116/88 | HR 88 | Temp 98.5°F | Resp 16 | Ht 70.0 in | Wt 231.1 lb

## 2012-10-30 DIAGNOSIS — M25469 Effusion, unspecified knee: Secondary | ICD-10-CM | POA: Insufficient documentation

## 2012-10-30 DIAGNOSIS — M7989 Other specified soft tissue disorders: Secondary | ICD-10-CM

## 2012-10-30 LAB — CBC WITH DIFFERENTIAL/PLATELET
Basophils Relative: 1 % (ref 0–1)
Eosinophils Absolute: 0.1 10*3/uL (ref 0.0–0.7)
Eosinophils Relative: 2 % (ref 0–5)
HCT: 40.1 % (ref 39.0–52.0)
Hemoglobin: 13.9 g/dL (ref 13.0–17.0)
MCH: 31.2 pg (ref 26.0–34.0)
MCHC: 34.7 g/dL (ref 30.0–36.0)
Monocytes Absolute: 0.3 10*3/uL (ref 0.1–1.0)
Monocytes Relative: 8 % (ref 3–12)
Neutrophils Relative %: 56 % (ref 43–77)

## 2012-10-30 MED ORDER — DOXYCYCLINE HYCLATE 100 MG PO TABS: 100.0000 mg | ORAL_TABLET | Freq: Two times a day (BID) | ORAL | Status: DC

## 2012-10-30 MED ORDER — HYDROCODONE-ACETAMINOPHEN 5-500 MG PO TABS: 1.0000 | ORAL_TABLET | Freq: Four times a day (QID) | ORAL | Status: DC | PRN

## 2012-10-30 NOTE — Patient Instructions (Addendum)
Please complete your blood work prior to leaving.   Complete your ultrasound on the first floor. Start doxycycline. Follow up with Dr. Rodena Medin on 12/12. Call sooner if increased pain, redness of leg, or if you develop fever.
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
## 2012-10-31 DIAGNOSIS — M7989 Other specified soft tissue disorders: Secondary | ICD-10-CM | POA: Insufficient documentation

## 2012-10-31 NOTE — Assessment & Plan Note (Signed)
RLE doppler obtained to exclude DVT- negative. CBC notes normal WBC.  Plan empiric rx with doxycycline due to hx or leg ulcer and MRSA in case this is an early cellulitis.  Keep upcoming ortho appointment.

## 2012-11-01 ENCOUNTER — Encounter: Payer: Self-pay | Admitting: Internal Medicine

## 2012-11-01 ENCOUNTER — Ambulatory Visit (INDEPENDENT_AMBULATORY_CARE_PROVIDER_SITE_OTHER): Payer: 59 | Admitting: Internal Medicine

## 2012-11-01 VITALS — BP 130/90 | HR 94 | Resp 16 | Wt 229.0 lb

## 2012-11-01 DIAGNOSIS — M25569 Pain in unspecified knee: Secondary | ICD-10-CM

## 2012-11-01 DIAGNOSIS — G8929 Other chronic pain: Secondary | ICD-10-CM

## 2012-11-01 MED ORDER — OXYCODONE-ACETAMINOPHEN 5-325 MG PO TABS
1.0000 | ORAL_TABLET | Freq: Three times a day (TID) | ORAL | Status: DC | PRN
Start: 2012-11-01 — End: 2012-12-07
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
## 2012-11-03 NOTE — Assessment & Plan Note (Signed)
Worsening. Attempt short term percocet. Proceed with orthopedic consult.

## 2012-11-05 ENCOUNTER — Other Ambulatory Visit: Payer: Self-pay | Admitting: Internal Medicine

## 2012-11-05 MED ORDER — INSULIN ASPART 100 UNIT/ML ~~LOC~~ SOLN: SUBCUTANEOUS | Status: DC

## 2012-11-05 NOTE — Telephone Encounter (Signed)
Rx to pharmacy/[facility] 

## 2012-11-07 ENCOUNTER — Telehealth: Payer: Self-pay | Admitting: *Deleted

## 2012-11-07 NOTE — Telephone Encounter (Signed)
Received fax from pharmacy informing that pt's Insurance will cover Crestor with increase in out of pocket expense to patient effective 01.01.14; consider changing [w/pt's approval-has been discussed w/pt by pharmacy] to Simvastatin; Atorvastatin; Pravastatin; Lovastatin for $0 co-pay/SLS Please advise.

## 2012-11-07 NOTE — Telephone Encounter (Signed)
If he is not going to be able to keep getting the crestor then the next strongest one would be lipitor 80mg  qd.

## 2012-11-12 MED ORDER — ATORVASTATIN CALCIUM 80 MG PO TABS: 80.0000 mg | ORAL_TABLET | Freq: Every day | ORAL | Status: DC

## 2012-11-12 MED ORDER — INSULIN GLARGINE 100 UNIT/ML ~~LOC~~ SOLN: 33.0000 [IU] | Freq: Every day | SUBCUTANEOUS | Status: DC

## 2012-11-12 NOTE — Telephone Encounter (Signed)
Rx for new cholesterol medication [change d/y Insurance cost] & Lantus to pharmacy/SLS

## 2012-11-28 ENCOUNTER — Ambulatory Visit: Payer: 59

## 2012-11-30 ENCOUNTER — Emergency Department (HOSPITAL_BASED_OUTPATIENT_CLINIC_OR_DEPARTMENT_OTHER): Payer: 59

## 2012-11-30 ENCOUNTER — Emergency Department (HOSPITAL_BASED_OUTPATIENT_CLINIC_OR_DEPARTMENT_OTHER)
Admission: EM | Admit: 2012-11-30 | Discharge: 2012-11-30 | Disposition: A | Discharge status: Home / Self Care | Payer: 59 | Attending: Emergency Medicine | Admitting: Emergency Medicine

## 2012-11-30 ENCOUNTER — Encounter (HOSPITAL_BASED_OUTPATIENT_CLINIC_OR_DEPARTMENT_OTHER): Payer: Self-pay | Admitting: Emergency Medicine

## 2012-11-30 DIAGNOSIS — K219 Gastro-esophageal reflux disease without esophagitis: Secondary | ICD-10-CM | POA: Insufficient documentation

## 2012-11-30 DIAGNOSIS — F3289 Other specified depressive episodes: Secondary | ICD-10-CM | POA: Insufficient documentation

## 2012-11-30 DIAGNOSIS — Z794 Long term (current) use of insulin: Secondary | ICD-10-CM | POA: Insufficient documentation

## 2012-11-30 DIAGNOSIS — Z8709 Personal history of other diseases of the respiratory system: Secondary | ICD-10-CM | POA: Insufficient documentation

## 2012-11-30 DIAGNOSIS — Z87891 Personal history of nicotine dependence: Secondary | ICD-10-CM | POA: Insufficient documentation

## 2012-11-30 DIAGNOSIS — J40 Bronchitis, not specified as acute or chronic: Secondary | ICD-10-CM | POA: Insufficient documentation

## 2012-11-30 DIAGNOSIS — Z79899 Other long term (current) drug therapy: Secondary | ICD-10-CM | POA: Insufficient documentation

## 2012-11-30 DIAGNOSIS — R059 Cough, unspecified: Secondary | ICD-10-CM | POA: Insufficient documentation

## 2012-11-30 DIAGNOSIS — E119 Type 2 diabetes mellitus without complications: Secondary | ICD-10-CM | POA: Insufficient documentation

## 2012-11-30 DIAGNOSIS — I10 Essential (primary) hypertension: Secondary | ICD-10-CM | POA: Insufficient documentation

## 2012-11-30 DIAGNOSIS — R05 Cough: Secondary | ICD-10-CM | POA: Insufficient documentation

## 2012-11-30 DIAGNOSIS — E785 Hyperlipidemia, unspecified: Secondary | ICD-10-CM | POA: Insufficient documentation

## 2012-11-30 DIAGNOSIS — F329 Major depressive disorder, single episode, unspecified: Secondary | ICD-10-CM | POA: Insufficient documentation

## 2012-11-30 DIAGNOSIS — J3489 Other specified disorders of nose and nasal sinuses: Secondary | ICD-10-CM | POA: Insufficient documentation

## 2012-11-30 LAB — BASIC METABOLIC PANEL WITH GFR
BUN: 11 mg/dL (ref 6–23)
CO2: 23 meq/L (ref 19–32)
Calcium: 9 mg/dL (ref 8.4–10.5)
Chloride: 101 meq/L (ref 96–112)
Creatinine, Ser: 0.8 mg/dL (ref 0.50–1.35)
GFR calc Af Amer: >90 mL/min
GFR calc non Af Amer: >90 mL/min
Glucose, Bld: 215 mg/dL — ABNORMAL HIGH (ref 70–99)
Potassium: 3.2 meq/L — ABNORMAL LOW (ref 3.5–5.1)
Sodium: 138 meq/L (ref 135–145)

## 2012-11-30 LAB — CBC WITH DIFFERENTIAL/PLATELET
Basophils Absolute: 0 10*3/uL (ref 0.0–0.1)
Basophils Relative: 0 % (ref 0–1)
Eosinophils Absolute: 0.1 10*3/uL (ref 0.0–0.7)
Eosinophils Relative: 2 % (ref 0–5)
HCT: 35.3 % — ABNORMAL LOW (ref 39.0–52.0)
Hemoglobin: 12.8 g/dL — ABNORMAL LOW (ref 13.0–17.0)
Lymphocytes Relative: 19 % (ref 12–46)
Lymphs Abs: 1.3 10*3/uL (ref 0.7–4.0)
MCH: 31.2 pg (ref 26.0–34.0)
MCHC: 36.3 g/dL — ABNORMAL HIGH (ref 30.0–36.0)
MCV: 86.1 fL (ref 78.0–100.0)
Monocytes Absolute: 0.7 10*3/uL (ref 0.1–1.0)
Monocytes Relative: 10 % (ref 3–12)
Neutro Abs: 4.5 10*3/uL (ref 1.7–7.7)
Neutrophils Relative %: 69 % (ref 43–77)
Platelets: 134 10*3/uL — ABNORMAL LOW (ref 150–400)
RBC: 4.1 MIL/uL — ABNORMAL LOW (ref 4.22–5.81)
RDW: 12 % (ref 11.5–15.5)
WBC: 6.6 10*3/uL (ref 4.0–10.5)

## 2012-11-30 MED ORDER — AZITHROMYCIN 250 MG PO TABS: 250.0000 mg | ORAL_TABLET | Freq: Every day | ORAL | Status: DC

## 2012-11-30 MED ORDER — SODIUM CHLORIDE 0.9 % IV BOLUS (SEPSIS)
1000.0000 mL | Freq: Once | INTRAVENOUS | Status: AC
  Administered 2012-11-30: 1000 mL via INTRAVENOUS

## 2012-11-30 MED ORDER — AZITHROMYCIN 250 MG PO TABS
500.0000 mg | ORAL_TABLET | Freq: Once | ORAL | Status: AC
  Administered 2012-11-30: 500 mg via ORAL
  Filled 2012-11-30: qty 2

## 2012-11-30 MED ORDER — HYDROCOD POLST-CHLORPHEN POLST 10-8 MG/5ML PO LQCR: 5.0000 mL | Freq: Two times a day (BID) | ORAL | Status: DC | PRN

## 2012-11-30 NOTE — ED Notes (Signed)
Pt c/o fever, cough, nasal/sinus congestion x 5 days. "Fever 103.3 at home & took 800mg  Ibuprofen x 1 hr ago." Denies N/V.
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
## 2012-12-06 ENCOUNTER — Telehealth: Payer: Self-pay | Admitting: Internal Medicine

## 2012-12-06 NOTE — Telephone Encounter (Signed)
Patient Information:  Caller Name: [place]  Phone: [phone]  Patient: [name], [name]  Gender: Male  DOB: Aug 29, 1971  Age: 42 Years  PCP: Marguarite Arbour (Adults only)  Office Follow Up:  Does the office need to follow up with this patient?: No  Instructions For The Office: N/A   Symptoms  Reason For Call & Symptoms: Completed a Zpac for bronchitis and flu sx that he was given on 1/11.  Was feelling better but now has sinus pain and pressure with a h/a and a severe cough.  Reviewed Health History In EMR: Yes  Reviewed Medications In EMR: Yes  Reviewed Allergies In EMR: Yes  Reviewed Surgeries / Procedures: Yes  Date of Onset of Symptoms: 12/04/2012  Guideline(s) Used:  Influenza Follow-Up Call  Disposition Per Guideline:   See Today or Tomorrow in Office  Reason For Disposition Reached:   Patient wants to be seen  Advice Given:  N/A  Appointment Scheduled:  12/07/2012 13:15:00 Appointment Scheduled Provider:  Sandford Craze (Adults only)  Refused appointment today, no transportation.  No monitor his BS and fever and if he develops a fever he is to go to the ED.

## 2012-12-06 NOTE — Telephone Encounter (Signed)
Noted/SLS

## 2012-12-07 ENCOUNTER — Encounter: Payer: Self-pay | Admitting: Family

## 2012-12-07 ENCOUNTER — Ambulatory Visit (INDEPENDENT_AMBULATORY_CARE_PROVIDER_SITE_OTHER): Payer: 59 | Admitting: Family

## 2012-12-07 ENCOUNTER — Ambulatory Visit (HOSPITAL_BASED_OUTPATIENT_CLINIC_OR_DEPARTMENT_OTHER)
Admission: RE | Admit: 2012-12-07 | Discharge: 2012-12-07 | Disposition: A | Discharge status: Home / Self Care | Payer: 59 | Source: Ambulatory Visit | Attending: Family | Admitting: Family

## 2012-12-07 VITALS — BP 106/78 | HR 110 | Temp 97.8°F | Resp 16 | Wt 223.0 lb

## 2012-12-07 DIAGNOSIS — R350 Frequency of micturition: Secondary | ICD-10-CM

## 2012-12-07 DIAGNOSIS — R509 Fever, unspecified: Secondary | ICD-10-CM | POA: Insufficient documentation

## 2012-12-07 DIAGNOSIS — R059 Cough, unspecified: Secondary | ICD-10-CM

## 2012-12-07 DIAGNOSIS — J40 Bronchitis, not specified as acute or chronic: Secondary | ICD-10-CM

## 2012-12-07 DIAGNOSIS — IMO0001 Reserved for inherently not codable concepts without codable children: Secondary | ICD-10-CM

## 2012-12-07 DIAGNOSIS — R05 Cough: Secondary | ICD-10-CM

## 2012-12-07 DIAGNOSIS — IMO0002 Reserved for concepts with insufficient information to code with codable children: Secondary | ICD-10-CM

## 2012-12-07 DIAGNOSIS — E1165 Type 2 diabetes mellitus with hyperglycemia: Secondary | ICD-10-CM

## 2012-12-07 LAB — POCT URINALYSIS DIPSTICK
Leukocytes, UA: NEGATIVE
Spec Grav, UA: 1.025
Urobilinogen, UA: 0.2
pH, UA: 6

## 2012-12-07 MED ORDER — HYDROCOD POLST-CHLORPHEN POLST 10-8 MG/5ML PO LQCR: 5.0000 mL | Freq: Two times a day (BID) | ORAL | Status: DC | PRN

## 2012-12-07 MED ORDER — ALBUTEROL SULFATE HFA 108 (90 BASE) MCG/ACT IN AERS: 2.0000 | INHALATION_SPRAY | Freq: Four times a day (QID) | RESPIRATORY_TRACT | Status: DC | PRN

## 2012-12-07 MED ORDER — CEFUROXIME AXETIL 500 MG PO TABS: 500.0000 mg | ORAL_TABLET | Freq: Two times a day (BID) | ORAL | Status: DC
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
## 2012-12-07 NOTE — Patient Instructions (Addendum)
Please call if symptoms worsen or if no improvement in 1 week.

## 2012-12-13 ENCOUNTER — Telehealth: Payer: Self-pay | Admitting: Family

## 2012-12-13 DIAGNOSIS — J40 Bronchitis, not specified as acute or chronic: Secondary | ICD-10-CM | POA: Insufficient documentation

## 2012-12-13 NOTE — Telephone Encounter (Addendum)
Left message requesting that pt return our call. Need to answer the following questions so I can complete DMV paperwork please.   Spoke with pt. Reviewed questions.  He requests that form be faxed to:  [phone]

## 2012-12-13 NOTE — Assessment & Plan Note (Signed)
CXR is performed and negative for pneumonia.  Will refill tussionex and rx with ceftin.

## 2012-12-13 NOTE — Assessment & Plan Note (Signed)
Likely worsened by acute illness.  Continue current dose lantus for now.

## 2012-12-14 NOTE — Telephone Encounter (Signed)
Form faxed

## 2012-12-23 ENCOUNTER — Encounter (HOSPITAL_BASED_OUTPATIENT_CLINIC_OR_DEPARTMENT_OTHER): Payer: Self-pay

## 2012-12-23 ENCOUNTER — Emergency Department (HOSPITAL_BASED_OUTPATIENT_CLINIC_OR_DEPARTMENT_OTHER)
Admission: EM | Admit: 2012-12-23 | Discharge: 2012-12-23 | Disposition: A | Discharge status: Home / Self Care | Payer: 59 | Attending: Emergency Medicine | Admitting: Emergency Medicine

## 2012-12-23 ENCOUNTER — Emergency Department (HOSPITAL_BASED_OUTPATIENT_CLINIC_OR_DEPARTMENT_OTHER): Payer: 59

## 2012-12-23 DIAGNOSIS — Z794 Long term (current) use of insulin: Secondary | ICD-10-CM | POA: Insufficient documentation

## 2012-12-23 DIAGNOSIS — Z87891 Personal history of nicotine dependence: Secondary | ICD-10-CM | POA: Insufficient documentation

## 2012-12-23 DIAGNOSIS — W108XXA Fall (on) (from) other stairs and steps, initial encounter: Secondary | ICD-10-CM | POA: Insufficient documentation

## 2012-12-23 DIAGNOSIS — Z87828 Personal history of other (healed) physical injury and trauma: Secondary | ICD-10-CM | POA: Insufficient documentation

## 2012-12-23 DIAGNOSIS — F3289 Other specified depressive episodes: Secondary | ICD-10-CM | POA: Insufficient documentation

## 2012-12-23 DIAGNOSIS — E785 Hyperlipidemia, unspecified: Secondary | ICD-10-CM | POA: Insufficient documentation

## 2012-12-23 DIAGNOSIS — I10 Essential (primary) hypertension: Secondary | ICD-10-CM | POA: Insufficient documentation

## 2012-12-23 DIAGNOSIS — Y939 Activity, unspecified: Secondary | ICD-10-CM | POA: Insufficient documentation

## 2012-12-23 DIAGNOSIS — S66919A Strain of unspecified muscle, fascia and tendon at wrist and hand level, unspecified hand, initial encounter: Secondary | ICD-10-CM

## 2012-12-23 DIAGNOSIS — F329 Major depressive disorder, single episode, unspecified: Secondary | ICD-10-CM | POA: Insufficient documentation

## 2012-12-23 DIAGNOSIS — K219 Gastro-esophageal reflux disease without esophagitis: Secondary | ICD-10-CM | POA: Insufficient documentation

## 2012-12-23 DIAGNOSIS — E119 Type 2 diabetes mellitus without complications: Secondary | ICD-10-CM | POA: Insufficient documentation

## 2012-12-23 DIAGNOSIS — S63509A Unspecified sprain of unspecified wrist, initial encounter: Secondary | ICD-10-CM | POA: Insufficient documentation

## 2012-12-23 DIAGNOSIS — Y929 Unspecified place or not applicable: Secondary | ICD-10-CM | POA: Insufficient documentation

## 2012-12-23 DIAGNOSIS — Z79899 Other long term (current) drug therapy: Secondary | ICD-10-CM | POA: Insufficient documentation

## 2012-12-23 NOTE — ED Provider Notes (Signed)
I saw and evaluated the patient, reviewed the resident's note and I agree with the findings and plan.   .Face to face Exam:  General:  Awake HEENT:  Atraumatic Resp:  Normal effort Abd:  Nondistended Neuro:No focal weakness Lymph: No adenopathy   Nelia Shi, MD 12/23/12 1309

## 2012-12-23 NOTE — ED Notes (Signed)
Pt states that he fell yesterday and landed on his right wrist.  Pt states that he has severe pain, denies swelling,  Limited movement.

## 2012-12-23 NOTE — ED Notes (Addendum)
Pt returned from radiology, Resident at bedside
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
## 2012-12-26 ENCOUNTER — Telehealth: Payer: Self-pay

## 2012-12-26 NOTE — Telephone Encounter (Signed)
We received a fax from [facility] stating that nexium cost is changing from $0.00 to $25.00. Protonix 40 mg- 1 po qd qty 90 is changing to $0.00. Per MD ok to switch if pt wants to.  Left a detailed message for patient to return my call

## 2013-01-02 MED ORDER — PANTOPRAZOLE SODIUM 40 MG PO TBEC: 40.0000 mg | DELAYED_RELEASE_TABLET | Freq: Every day | ORAL | Status: DC

## 2013-01-02 NOTE — Telephone Encounter (Signed)
Going to go ahead and fill since I haven't heard from pt

## 2013-01-09 ENCOUNTER — Other Ambulatory Visit: Payer: Self-pay | Admitting: Internal Medicine

## 2013-01-17 ENCOUNTER — Ambulatory Visit (INDEPENDENT_AMBULATORY_CARE_PROVIDER_SITE_OTHER): Payer: Self-pay | Admitting: Family Medicine

## 2013-01-17 NOTE — Progress Notes (Signed)
Patient presents today for DM follow-up as part of employer-sponsored Link to [employer]. Medications, glucose readings, a1c and compliance have been reviewed. I have also discussed with patient lifestyle interventions such as diet and exercise. Details of the visit can be found in [facility] documenting program through [facility]). Patient has set a series of personal goals and will follow-up in no more than 3 months for further review of DM. Significant increase in A1c today (8.7% from previous 7.3%) attributed by patient to weight gain (20 lbs) secondary to smoking cessation. He is resistant to suggestion of dilated eye exam (18 months since previous). He continues to have hypoglycemic events after overcorrecting hyperglycemic numbers, but frequency of events has decreased. We discussed how to avoid hypoglycemic events and also discussed portion control/food selection.

## 2013-01-18 ENCOUNTER — Encounter: Payer: Self-pay | Admitting: Family

## 2013-01-18 ENCOUNTER — Ambulatory Visit (INDEPENDENT_AMBULATORY_CARE_PROVIDER_SITE_OTHER): Payer: 59 | Admitting: Family

## 2013-01-18 VITALS — BP 140/98 | HR 88 | Temp 97.8°F | Resp 16 | Wt 231.1 lb

## 2013-01-18 DIAGNOSIS — M25562 Pain in left knee: Secondary | ICD-10-CM

## 2013-01-18 DIAGNOSIS — M25531 Pain in right wrist: Secondary | ICD-10-CM

## 2013-01-18 DIAGNOSIS — IMO0001 Reserved for inherently not codable concepts without codable children: Secondary | ICD-10-CM

## 2013-01-18 DIAGNOSIS — M25569 Pain in unspecified knee: Secondary | ICD-10-CM

## 2013-01-18 DIAGNOSIS — M25539 Pain in unspecified wrist: Secondary | ICD-10-CM

## 2013-01-18 DIAGNOSIS — IMO0002 Reserved for concepts with insufficient information to code with codable children: Secondary | ICD-10-CM

## 2013-01-18 LAB — BASIC METABOLIC PANEL WITH GFR
Calcium: 9.5 mg/dL (ref 8.4–10.5)
Creat: 1.02 mg/dL (ref 0.50–1.35)
GFR, Est African American: >89 mL/min
Glucose, Bld: 242 mg/dL — ABNORMAL HIGH (ref 70–99)
Sodium: 136 mEq/L (ref 135–145)

## 2013-01-18 LAB — HEMOGLOBIN A1C: Mean Plasma Glucose: 197 mg/dL — ABNORMAL HIGH (ref <117)

## 2013-01-18 MED ORDER — TRAMADOL HCL 50 MG PO TABS: 50.0000 mg | ORAL_TABLET | Freq: Three times a day (TID) | ORAL | Status: DC | PRN
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
## 2013-01-18 NOTE — Patient Instructions (Addendum)
Call me in 1 week with your record of fasting sugars. Work hard on diet and exercise. Please call to schedule your endocrinology appointment and follow up eye exam.   You will be contacted about your referral to orthopedics.   Please let us know if you have not heard back within 1 week about your referral. Please complete your lab work prior to leaving today.   Please schedule a follow up appointment in 3 months.

## 2013-01-19 NOTE — Assessment & Plan Note (Signed)
Refer to ortho.

## 2013-01-19 NOTE — Assessment & Plan Note (Addendum)
Advised pt to avoid post prandial correction insulin doses as I think that this maybe cause for hypoglycemic events. Recommended that he follow up with endo. Check fasting sugars x 1 week. If stable without hypoglycemia, plan upward titration of lantus.

## 2013-01-20 ENCOUNTER — Encounter: Payer: Self-pay | Admitting: Family

## 2013-01-29 NOTE — Progress Notes (Signed)
ATTENDING PHYSICIAN NOTE: I have reviewed the chart and agree with the plan as detailed above. Sara Neal MD Pager [phone]  

## 2013-02-27 ENCOUNTER — Other Ambulatory Visit: Payer: Self-pay | Admitting: Internal Medicine

## 2013-02-27 NOTE — Telephone Encounter (Signed)
Tramadol request [Last Rx 02.28.14 #[id number]]/SLS Please advise.

## 2013-03-05 ENCOUNTER — Other Ambulatory Visit: Payer: Self-pay | Admitting: Internal Medicine

## 2013-03-06 ENCOUNTER — Other Ambulatory Visit: Payer: Self-pay | Admitting: Internal Medicine

## 2013-03-06 NOTE — Telephone Encounter (Signed)
Rx request to pharmacy/SLS  

## 2013-03-14 ENCOUNTER — Other Ambulatory Visit: Payer: Self-pay | Admitting: Family

## 2013-03-26 ENCOUNTER — Encounter: Payer: Self-pay | Admitting: Family Medicine

## 2013-03-26 ENCOUNTER — Ambulatory Visit (INDEPENDENT_AMBULATORY_CARE_PROVIDER_SITE_OTHER): Payer: 59 | Admitting: Family Medicine

## 2013-03-26 VITALS — BP 118/96 | HR 92 | Temp 97.7°F | Ht 70.0 in | Wt 222.0 lb

## 2013-03-26 DIAGNOSIS — L0390 Cellulitis, unspecified: Secondary | ICD-10-CM

## 2013-03-26 DIAGNOSIS — G2581 Restless legs syndrome: Secondary | ICD-10-CM

## 2013-03-26 DIAGNOSIS — I10 Essential (primary) hypertension: Secondary | ICD-10-CM

## 2013-03-26 DIAGNOSIS — IMO0002 Reserved for concepts with insufficient information to code with codable children: Secondary | ICD-10-CM

## 2013-03-26 DIAGNOSIS — M792 Neuralgia and neuritis, unspecified: Secondary | ICD-10-CM

## 2013-03-26 DIAGNOSIS — A4902 Methicillin resistant Staphylococcus aureus infection, unspecified site: Secondary | ICD-10-CM

## 2013-03-26 DIAGNOSIS — L739 Follicular disorder, unspecified: Secondary | ICD-10-CM

## 2013-03-26 DIAGNOSIS — E1165 Type 2 diabetes mellitus with hyperglycemia: Secondary | ICD-10-CM

## 2013-03-26 DIAGNOSIS — L738 Other specified follicular disorders: Secondary | ICD-10-CM

## 2013-03-26 MED ORDER — GABAPENTIN 300 MG PO CAPS: 900.0000 mg | ORAL_CAPSULE | Freq: Three times a day (TID) | ORAL | Status: DC

## 2013-03-26 MED ORDER — MUPIROCIN 2 % EX OINT: TOPICAL_OINTMENT | Freq: Every day | CUTANEOUS | Status: DC

## 2013-03-26 MED ORDER — CHLORHEXIDINE GLUCONATE 4 % EX LIQD: 60.0000 mL | Freq: Every day | CUTANEOUS | Status: DC | PRN

## 2013-03-26 MED ORDER — PRAMIPEXOLE DIHYDROCHLORIDE 0.25 MG PO TABS: ORAL_TABLET | ORAL | Status: DC

## 2013-03-26 MED ORDER — SULFAMETHOXAZOLE-TRIMETHOPRIM 800-160 MG PO TABS: 1.0000 | ORAL_TABLET | Freq: Two times a day (BID) | ORAL | Status: DC

## 2013-03-26 NOTE — Patient Instructions (Addendum)
Call the week ahead to set up labs if have not seen endocrine   Restless Legs Syndrome Restless legs syndrome is a movement disorder. It may also be called a sensori-motor disorder.  CAUSES  No one knows what specifically causes restless legs syndrome, but it tends to run in families. It is also more common in people with low iron, in pregnancy, in people who need dialysis, and those with nerve damage (neuropathy).Some medications may make restless legs syndrome worse.Those medications include drugs to treat high blood pressure, some heart conditions, nausea, colds, allergies, and depression. SYMPTOMS Symptoms include uncomfortable sensations in the legs. These leg sensations are worse during periods of inactivity or rest. They are also worse while sitting or lying down. Individuals that have the disorder describe sensations in the legs that feel like:  Pulling.  Drawing.  Crawling.  Worming.  Boring.  Tingling.  Pins and needles.  Prickling.  Pain. The sensations are usually accompanied by an overwhelming urge to move the legs. Sudden muscle jerks may also occur. Movement provides temporary relief from the discomfort. In rare cases, the arms may also be affected. Symptoms may interfere with going to sleep (sleep onset insomnia). Restless legs syndrome may also be related to periodic limb movement disorder (PLMD). PLMD is another more common motor disorder. It also causes interrupted sleep. The symptoms from PLMD usually occur most often when you are awake. TREATMENT  Treatment for restless legs syndrome is symptomatic. This means that the symptoms are treated.   Massage and cold compresses may provide temporary relief.  Walk, stretch, or take a cold or hot bath.  Get regular exercise and a good night's sleep.  Avoid caffeine, alcohol, nicotine, and medications that can make it worse.  Do activities that provide mental stimulation like discussions, needlework, and video  games. These may be helpful if you are not able to walk or stretch. Some medications are effective in relieving the symptoms. However, many of these medications have side effects. Ask your caregiver about medications that may help your symptoms. Correcting iron deficiency may improve symptoms for some patients. Document Released: 10/28/2002 Document Revised: 01/30/2012 Document Reviewed: 02/03/2011 [facility] Patient Information 2013 [place], [place].

## 2013-03-27 ENCOUNTER — Encounter: Payer: Self-pay | Admitting: Family Medicine

## 2013-03-27 DIAGNOSIS — I10 Essential (primary) hypertension: Secondary | ICD-10-CM

## 2013-03-27 DIAGNOSIS — G2581 Restless legs syndrome: Secondary | ICD-10-CM

## 2013-03-27 HISTORY — DX: Essential (primary) hypertension: I10

## 2013-03-27 HISTORY — DX: Restless legs syndrome: G25.81

## 2013-03-27 NOTE — Assessment & Plan Note (Signed)
Will increase Gabapentin to 900 mg tid and reassess at next visit.
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
## 2013-03-27 NOTE — Assessment & Plan Note (Signed)
Last hgba1c was elevated. Encouraged decreased carbs continue current meds and recheck hgba1c in 1-2 months

## 2013-03-27 NOTE — Assessment & Plan Note (Addendum)
Hiclens several times a week, Mupirocin to nares qhs. Probiotics. Bactrim and referred to dermatology due to recurrent nature of lesions

## 2013-03-27 NOTE — Assessment & Plan Note (Signed)
Well controlled 

## 2013-03-27 NOTE — Assessment & Plan Note (Signed)
Start Mirapex 0.25 mg 1 to 2 tabs po qhs

## 2013-04-10 ENCOUNTER — Other Ambulatory Visit: Payer: Self-pay | Admitting: Internal Medicine

## 2013-04-11 ENCOUNTER — Ambulatory Visit (INDEPENDENT_AMBULATORY_CARE_PROVIDER_SITE_OTHER): Payer: Self-pay | Admitting: Family Medicine

## 2013-04-11 DIAGNOSIS — E119 Type 2 diabetes mellitus without complications: Secondary | ICD-10-CM

## 2013-04-11 NOTE — Progress Notes (Signed)
Patient presents today for DM follow-up as part of employer-sponsored Link to [employer]. Medications, glucose readings, a1c and compliance have been reviewed. I have also discussed with patient lifestyle interventions including diet and exercise. Details of the visit can be found in [employer] documenting program through [employer] [facility]). Patient has set a series of personal goals and will follow-up in 6 weeks for further review of DM.  Today we specifically focused on reducing carbohydrates throughout the day, but especially on the morning meal, reducing bread consumption, getting a dilated eye exam. I also recommended an increase of his Lantus dose to 40 Units nightly given his more consistent blood glucose values and reduced rate of hypoglycemia. A1c today is 8.5%.

## 2013-04-16 ENCOUNTER — Other Ambulatory Visit: Payer: Self-pay | Admitting: Family

## 2013-04-17 ENCOUNTER — Ambulatory Visit: Payer: 59 | Admitting: Family

## 2013-04-17 DIAGNOSIS — Z0289 Encounter for other administrative examinations: Secondary | ICD-10-CM

## 2013-04-18 NOTE — Progress Notes (Signed)
Patient ID: [name], male   DOB: 10-Jan-1971, 42 y.o.   MRN: [id number] ATTENDING PHYSICIAN NOTE: I have reviewed the chart and agree with the plan as detailed above. [name] MD Pager [phone]

## 2013-05-08 ENCOUNTER — Encounter: Payer: Self-pay | Admitting: Family Medicine

## 2013-05-08 ENCOUNTER — Ambulatory Visit (INDEPENDENT_AMBULATORY_CARE_PROVIDER_SITE_OTHER): Payer: 59 | Admitting: Family Medicine

## 2013-05-08 VITALS — BP 110/82 | HR 100 | Temp 97.8°F | Ht 70.0 in | Wt 225.1 lb

## 2013-05-08 DIAGNOSIS — R52 Pain, unspecified: Secondary | ICD-10-CM

## 2013-05-08 DIAGNOSIS — I10 Essential (primary) hypertension: Secondary | ICD-10-CM

## 2013-05-08 DIAGNOSIS — G43909 Migraine, unspecified, not intractable, without status migrainosus: Secondary | ICD-10-CM

## 2013-05-08 DIAGNOSIS — M549 Dorsalgia, unspecified: Secondary | ICD-10-CM

## 2013-05-08 DIAGNOSIS — E119 Type 2 diabetes mellitus without complications: Secondary | ICD-10-CM

## 2013-05-08 DIAGNOSIS — G2581 Restless legs syndrome: Secondary | ICD-10-CM

## 2013-05-08 DIAGNOSIS — E785 Hyperlipidemia, unspecified: Secondary | ICD-10-CM

## 2013-05-08 DIAGNOSIS — E1165 Type 2 diabetes mellitus with hyperglycemia: Secondary | ICD-10-CM

## 2013-05-08 HISTORY — DX: Dorsalgia, unspecified: M54.9

## 2013-05-08 HISTORY — DX: Migraine, unspecified, not intractable, without status migrainosus: G43.909

## 2013-05-08 LAB — RENAL FUNCTION PANEL
Albumin: 4.5 g/dL (ref 3.5–5.2)
CO2: 23 mEq/L (ref 19–32)
Calcium: 9.4 mg/dL (ref 8.4–10.5)
Phosphorus: 3.2 mg/dL (ref 2.3–4.6)
Sodium: 138 mEq/L (ref 135–145)

## 2013-05-08 LAB — CBC
MCH: 30.6 pg (ref 26.0–34.0)
MCHC: 35.8 g/dL (ref 30.0–36.0)
Platelets: 198 10*3/uL (ref 150–400)
RBC: 5.23 MIL/uL (ref 4.22–5.81)
RDW: 13.8 % (ref 11.5–15.5)

## 2013-05-08 LAB — TSH: TSH: 1.34 u[IU]/mL (ref 0.350–4.500)

## 2013-05-08 LAB — LIPID PANEL
Cholesterol: 157 mg/dL (ref 0–200)
Triglycerides: 186 mg/dL — ABNORMAL HIGH (ref <150)

## 2013-05-08 LAB — HEPATIC FUNCTION PANEL
Bilirubin, Direct: 0.1 mg/dL (ref 0.0–0.3)
Total Bilirubin: 0.5 mg/dL (ref 0.3–1.2)

## 2013-05-08 MED ORDER — TRAMADOL HCL 50 MG PO TABS: 50.0000 mg | ORAL_TABLET | Freq: Three times a day (TID) | ORAL | Status: DC | PRN

## 2013-05-08 MED ORDER — DIAZEPAM 5 MG PO TABS: ORAL_TABLET | ORAL | Status: DC

## 2013-05-08 MED ORDER — PRAMIPEXOLE DIHYDROCHLORIDE 1 MG PO TABS: 1.0000 mg | ORAL_TABLET | Freq: Every day | ORAL | Status: DC

## 2013-05-08 NOTE — Assessment & Plan Note (Signed)
Improving with better sleep, only 3 per month at this time, will try and improve sleep further and reminded to exercise, hydrate well and eat small, frequent meals with lean proteins, continue meds prn, did take a BC powder prior to coming in.

## 2013-05-08 NOTE — Assessment & Plan Note (Signed)
Well controlled, no change in meds today 

## 2013-05-08 NOTE — Assessment & Plan Note (Signed)
Check lipid panel today, avoid trans fats.

## 2013-05-08 NOTE — Assessment & Plan Note (Signed)
Improved partially with Mirapex will try increasing the dose to 1 mg qhs

## 2013-05-08 NOTE — Patient Instructions (Addendum)
Labs prior to visit, lipid, renal, cbc, tsh, hgba1c, hepatic  Start probiotic Digestive Advantage daily or a generic  Lumbosacral Strain Lumbosacral strain is one of the most common causes of back pain. There are many causes of back pain. Most are not serious conditions. CAUSES  Your backbone (spinal column) is made up of 24 main vertebral bodies, the sacrum, and the coccyx. These are held together by muscles and tough, fibrous tissue (ligaments). Nerve roots pass through the openings between the vertebrae. A sudden move or injury to the back may cause injury to, or pressure on, these nerves. This may result in localized back pain or pain movement (radiation) into the buttocks, down the leg, and into the foot. Sharp, shooting pain from the buttock down the back of the leg (sciatica) is frequently associated with a ruptured (herniated) disk. Pain may be caused by muscle spasm alone. Your caregiver can often find the cause of your pain by the details of your symptoms and an exam. In some cases, you may need tests (such as X-rays). Your caregiver will work with you to decide if any tests are needed based on your specific exam. HOME CARE INSTRUCTIONS   Avoid an underactive lifestyle. Active exercise, as directed by your caregiver, is your greatest weapon against back pain.  Avoid hard physical activities (tennis, racquetball, waterskiing) if you are not in proper physical condition for it. This may aggravate or create problems.  If you have a back problem, avoid sports requiring sudden body movements. Swimming and walking are generally safer activities.  Maintain good posture.  Avoid becoming overweight (obese).  Use bed rest for only the most extreme, sudden (acute) episode. Your caregiver will help you determine how much bed rest is necessary.  For acute conditions, you may put ice on the injured area.  Put ice in a plastic bag.  Place a towel between your skin and the bag.  Leave the ice  on for 15-20 minutes at a time, every 2 hours, or as needed.  After you are improved and more active, it may help to apply heat for 30 minutes before activities. See your caregiver if you are having pain that lasts longer than expected. Your caregiver can advise appropriate exercises or therapy if needed. With conditioning, most back problems can be avoided. SEEK IMMEDIATE MEDICAL CARE IF:   You have numbness, tingling, weakness, or problems with the use of your arms or legs.  You experience severe back pain not relieved with medicines.  There is a change in bowel or bladder control.  You have increasing pain in any area of the body, including your belly (abdomen).  You notice shortness of breath, dizziness, or feel faint.  You feel sick to your stomach (nauseous), are throwing up (vomiting), or become sweaty.  You notice discoloration of your toes or legs, or your feet get very cold.  Your back pain is getting worse.  You have a fever. MAKE SURE YOU:   Understand these instructions.  Will watch your condition.  Will get help right away if you are not doing well or get worse. Document Released: 08/17/2005 Document Revised: 01/30/2012 Document Reviewed: 02/06/2009 [facility] Patient Information 2014 [place], [place].

## 2013-05-08 NOTE — Assessment & Plan Note (Signed)
Reports improved sugar control, repeat labs today, continue current meds for now

## 2013-05-08 NOTE — Assessment & Plan Note (Signed)
Low back can continue Tramadol and given Diazepam to use prn which has helped him in the past
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
## 2013-05-09 ENCOUNTER — Telehealth: Payer: Self-pay | Admitting: *Deleted

## 2013-05-09 ENCOUNTER — Telehealth: Payer: Self-pay

## 2013-05-09 LAB — HEMOGLOBIN A1C
Hgb A1c MFr Bld: 7.9 % — ABNORMAL HIGH (ref <5.7)
Mean Plasma Glucose: 180 mg/dL — ABNORMAL HIGH (ref <117)

## 2013-05-09 MED ORDER — NIACIN ER 500 MG PO CPCR: 500.0000 mg | ORAL_CAPSULE | Freq: Every day | ORAL | Status: DC

## 2013-05-09 NOTE — Telephone Encounter (Signed)
Patient returned call and was given instructions. Patient voiced understanding.

## 2013-05-09 NOTE — Telephone Encounter (Signed)
LMOM with contact name and number for return call RE: results and further provider instructions; new Rx pending/SLS  

## 2013-05-09 NOTE — Telephone Encounter (Signed)
Message copied by Regis Bill on Thu May 09, 2013 10:58 AM ------      Message from: Danise Edge A      Created: Wed May 08, 2013 11:03 PM       Notify hgba1c is up, aovid simple carbs and increase Lantus by 2 units, if blood sugars remain above 150 he should increase by 2 more units next week. Also cholesterol is still up. Have him start Niacin Er 500 mg po qhs, take a lowfat snack and ECASA 81 mg po 1/2 prior to dose. Warn him may have some flushing this should get better in the next 2 weeks. ------

## 2013-05-09 NOTE — Telephone Encounter (Signed)
Message copied by Court Joy on Thu May 09, 2013  9:04 AM ------      Message from: Darral Dash E      Created: Thu May 09, 2013  8:53 AM      Regarding: FMLA papers       Patient call with fax # to fax FMLA papers      [phone]  ------

## 2013-05-09 NOTE — Telephone Encounter (Signed)
We will fax to number [phone] when forms are done

## 2013-05-14 ENCOUNTER — Telehealth: Payer: Self-pay

## 2013-05-14 NOTE — Telephone Encounter (Signed)
Pt called back and asked that the paperwork be faxed to [phone] and a copy mailed to them as well

## 2013-05-14 NOTE — Telephone Encounter (Signed)
Left a message stating we needed to know where his spouses FMLA forms need to go? Copy sent to be scanned

## 2013-05-16 ENCOUNTER — Other Ambulatory Visit: Payer: Self-pay | Admitting: Family Medicine

## 2013-05-16 ENCOUNTER — Other Ambulatory Visit: Payer: Self-pay | Admitting: Internal Medicine

## 2013-05-30 ENCOUNTER — Other Ambulatory Visit: Payer: Self-pay

## 2013-05-30 NOTE — Progress Notes (Signed)
Patient presents today for DM follow-up as part of employer-sponsored Link to [employer]. Medications, glucose readings, a1c and compliance have been reviewed. I have also discussed with patient lifestyle interventions including diet and exercise. Details of the visit can be found in [employer] documenting program through [employer] [facility]). Patient has set a series of personal goals and will follow-up in no more than 3 months for further review of DM.  Today we had specific emphasis on his mental state and DM (and the relation between the two). He reports being more focused on reducing his simple carbohydrates lately. His blood glucose readings have been significantly more sporadic lately. I discussed the potential of a pump and he is open the idea if it is financially reasonable. It is not at the time, but should be revisited in a few months (benefits may change soon). I have not yet discussed with Dr. Abner Greenspan if she is comfortable following Mr. Canterbury if he is transitioned to a pump. I have requested he follow his Novolog boluses more carefully over the next 4-5 weeks and we will discuss those next month.

## 2013-05-31 ENCOUNTER — Ambulatory Visit (INDEPENDENT_AMBULATORY_CARE_PROVIDER_SITE_OTHER): Payer: Self-pay | Admitting: Family Medicine

## 2013-05-31 DIAGNOSIS — E119 Type 2 diabetes mellitus without complications: Secondary | ICD-10-CM

## 2013-06-06 ENCOUNTER — Other Ambulatory Visit: Payer: Self-pay | Admitting: Family

## 2013-06-11 NOTE — Progress Notes (Signed)
Patient ID: [name], male   DOB: [birth year], 42 y.o.   MRN: [id number] ATTENDING PHYSICIAN NOTE: I have reviewed the chart and agree with the plan as detailed above. [name] MD Pager [phone]

## 2013-07-03 ENCOUNTER — Telehealth: Payer: Self-pay | Admitting: Internal Medicine

## 2013-07-03 DIAGNOSIS — R52 Pain, unspecified: Secondary | ICD-10-CM

## 2013-07-03 NOTE — Telephone Encounter (Signed)
OK to send #[phone] no refill.

## 2013-07-03 NOTE — Telephone Encounter (Signed)
Refill- diazepam 5mg  tablet. Take 1/2 to 2 tablets by mouth twice daily as needed for pain, anxiety, and insomnia. Qty 60 last fill 7.14.14

## 2013-07-04 MED ORDER — DIAZEPAM 5 MG PO TABS: ORAL_TABLET | ORAL | Status: DC

## 2013-07-04 NOTE — Telephone Encounter (Signed)
Rx called to [facility]. 

## 2013-07-09 ENCOUNTER — Other Ambulatory Visit: Payer: Self-pay | Admitting: Family Medicine

## 2013-07-18 ENCOUNTER — Ambulatory Visit (INDEPENDENT_AMBULATORY_CARE_PROVIDER_SITE_OTHER): Payer: 59 | Admitting: Family Medicine

## 2013-07-18 DIAGNOSIS — E119 Type 2 diabetes mellitus without complications: Secondary | ICD-10-CM

## 2013-07-18 NOTE — Progress Notes (Signed)
Patient presents today for DM follow-up as part of employer-sponsored [employer] to [employer]. Medications, glucose readings, a1c and compliance have been reviewed. I have also discussed with patient lifestyle interventions including diet and exercise. Details of the visit can be found in [employer] documenting program through [employer] [facility]). Patient has set a series of personal goals and will follow-up in no more than 3 months for further review of DM.  Today's a1c: 8.1, weight: 221.2 Since our last visit (6 weeks ago), he went on a strict diet of significantly lower carbohydrates which led to a sharp increase in his rate of hypoglycemic events. Blood glucoses very sporadic again. Today we specifically focused on diet and impact of carbohydrates on blood glucoses and how to minimize hypoglycemic events by better managing Novolog dosing when decreasing carbohydrate consumption.  This conversation is worth repeating with him again. Back pain has resolved with increased activity (walking ~1 mile 1 or 2 times daily ~6 days per week). To discuss pump as option with providers in 2015 if plan benefits improve. Patient has experience (and better control previously) with pump.

## 2013-07-24 ENCOUNTER — Other Ambulatory Visit: Payer: Self-pay | Admitting: Family

## 2013-07-25 NOTE — Telephone Encounter (Signed)
eScribe request for refill on Cymbalta Last filled - 05.27.14, #[id number] Last AEX - 06.18.14 Next AEX - 3 Months Refill sent per [facility] refill protocol/SLS

## 2013-08-02 ENCOUNTER — Other Ambulatory Visit: Payer: Self-pay | Admitting: Family Medicine

## 2013-08-02 NOTE — Telephone Encounter (Signed)
Please advise refill? Last RX was done on 07-04-13 quantity 60 with 0 refills  If ok fax to [phone]

## 2013-08-05 NOTE — Telephone Encounter (Signed)
RX faxed

## 2013-08-07 ENCOUNTER — Ambulatory Visit: Payer: 59 | Admitting: Family Medicine

## 2013-08-07 ENCOUNTER — Encounter (INDEPENDENT_AMBULATORY_CARE_PROVIDER_SITE_OTHER): Payer: Self-pay | Admitting: Ophthalmology

## 2013-08-07 ENCOUNTER — Ambulatory Visit (INDEPENDENT_AMBULATORY_CARE_PROVIDER_SITE_OTHER): Payer: 59 | Admitting: Family Medicine

## 2013-08-07 ENCOUNTER — Encounter: Payer: Self-pay | Admitting: Family Medicine

## 2013-08-07 VITALS — BP 122/90 | HR 77 | Temp 97.9°F | Ht 70.0 in | Wt 225.0 lb

## 2013-08-07 DIAGNOSIS — E119 Type 2 diabetes mellitus without complications: Secondary | ICD-10-CM

## 2013-08-07 DIAGNOSIS — E1165 Type 2 diabetes mellitus with hyperglycemia: Secondary | ICD-10-CM

## 2013-08-07 DIAGNOSIS — F329 Major depressive disorder, single episode, unspecified: Secondary | ICD-10-CM

## 2013-08-07 DIAGNOSIS — Z23 Encounter for immunization: Secondary | ICD-10-CM

## 2013-08-07 DIAGNOSIS — N529 Male erectile dysfunction, unspecified: Secondary | ICD-10-CM

## 2013-08-07 DIAGNOSIS — E785 Hyperlipidemia, unspecified: Secondary | ICD-10-CM

## 2013-08-07 DIAGNOSIS — H269 Unspecified cataract: Secondary | ICD-10-CM

## 2013-08-07 DIAGNOSIS — F32A Depression, unspecified: Secondary | ICD-10-CM | POA: Insufficient documentation

## 2013-08-07 DIAGNOSIS — I10 Essential (primary) hypertension: Secondary | ICD-10-CM

## 2013-08-07 LAB — CBC
HCT: 40.4 % (ref 39.0–52.0)
Hemoglobin: 14.1 g/dL (ref 13.0–17.0)
MCH: 30.5 pg (ref 26.0–34.0)
MCHC: 34.9 g/dL (ref 30.0–36.0)
RBC: 4.63 MIL/uL (ref 4.22–5.81)

## 2013-08-07 LAB — RENAL FUNCTION PANEL
Albumin: 4.4 g/dL (ref 3.5–5.2)
BUN: 12 mg/dL (ref 6–23)
CO2: 28 mEq/L (ref 19–32)
Calcium: 9.1 mg/dL (ref 8.4–10.5)
Glucose, Bld: 286 mg/dL — ABNORMAL HIGH (ref 70–99)
Sodium: 135 mEq/L (ref 135–145)

## 2013-08-07 LAB — HEMOGLOBIN A1C: Hgb A1c MFr Bld: 8.3 % — ABNORMAL HIGH (ref <5.7)

## 2013-08-07 LAB — HEPATIC FUNCTION PANEL
ALT: 22 U/L (ref 0–53)
AST: 19 U/L (ref 0–37)
Bilirubin, Direct: 0.1 mg/dL (ref 0.0–0.3)
Indirect Bilirubin: 0.5 mg/dL (ref 0.0–0.9)
Total Protein: 7.1 g/dL (ref 6.0–8.3)

## 2013-08-07 LAB — LIPID PANEL
HDL: 35 mg/dL — ABNORMAL LOW (ref >39)
LDL Cholesterol: 49 mg/dL (ref 0–99)
Triglycerides: 158 mg/dL — ABNORMAL HIGH (ref <150)
VLDL: 32 mg/dL (ref 0–40)

## 2013-08-07 LAB — TSH: TSH: 2.187 u[IU]/mL (ref 0.350–4.500)

## 2013-08-07 MED ORDER — TADALAFIL 2.5 MG PO TABS: 2.5000 mg | ORAL_TABLET | Freq: Every day | ORAL | Status: DC

## 2013-08-07 MED ORDER — GNP ALCOHOL SWABS 70 % PADS: 1.0000 | MEDICATED_PAD | Status: DC | PRN
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
## 2013-08-07 NOTE — Patient Instructions (Addendum)

## 2013-08-10 ENCOUNTER — Encounter: Payer: Self-pay | Admitting: Family Medicine

## 2013-08-10 DIAGNOSIS — H269 Unspecified cataract: Secondary | ICD-10-CM | POA: Insufficient documentation

## 2013-08-10 HISTORY — DX: Unspecified cataract: H26.9

## 2013-08-10 NOTE — Assessment & Plan Note (Addendum)
Seen recently by optometry. Sugars elevated, encouraged to eat small, frequent meals. Has been skipping meals then has been eating to many carbs. Counseled at length regarding need to eat small, frequent meals. Use complex carbs and lean proteins.

## 2013-08-10 NOTE — Assessment & Plan Note (Signed)
Avoid trans fats, continue Atorvastatin

## 2013-08-10 NOTE — Assessment & Plan Note (Signed)
Well controlled no changes 

## 2013-08-10 NOTE — Assessment & Plan Note (Signed)
Patient acknowledges depression is worsening again will continue Cymbalta for now and consider changes at patient discretion.

## 2013-08-14 ENCOUNTER — Encounter (INDEPENDENT_AMBULATORY_CARE_PROVIDER_SITE_OTHER): Payer: Self-pay | Admitting: Ophthalmology

## 2013-08-27 ENCOUNTER — Other Ambulatory Visit: Payer: Self-pay | Admitting: Internal Medicine

## 2013-08-27 ENCOUNTER — Other Ambulatory Visit: Payer: Self-pay | Admitting: Family Medicine

## 2013-08-27 MED ORDER — DIAZEPAM 5 MG PO TABS: ORAL_TABLET | ORAL | Status: DC

## 2013-08-27 MED ORDER — INSULIN GLARGINE 100 UNIT/ML ~~LOC~~ SOLN: 40.0000 [IU] | Freq: Every day | SUBCUTANEOUS | Status: DC

## 2013-08-27 NOTE — Telephone Encounter (Signed)
RX faxed to pharmacy.

## 2013-08-27 NOTE — Telephone Encounter (Signed)
Please advise refill? Last RX was done on 08-02-13  If ok fax to [phone]

## 2013-08-28 NOTE — Telephone Encounter (Signed)
eScribe request for refill on Gabapentin Last filled - 08.19.14, #270x1 [request is Early] eScribe request for refill on Tramadol Last filled - 06.18.14, #90x3 [expires after 6 mths][request is Early] Last AEX - 09.17.14 Next AEX - 6-wks [appt scheduled 10.31.14] Please Advise/SLS

## 2013-09-04 ENCOUNTER — Encounter: Payer: Self-pay | Admitting: Family Medicine

## 2013-09-11 ENCOUNTER — Encounter (INDEPENDENT_AMBULATORY_CARE_PROVIDER_SITE_OTHER): Payer: Self-pay | Admitting: Ophthalmology

## 2013-09-18 NOTE — Progress Notes (Signed)
Patient ID: [name], male   DOB: 06/03/1971, 42 y.o.   MRN: [id number] ATTENDING PHYSICIAN NOTE: I have reviewed the chart and agree with the plan as detailed above. Sara Neal MD Pager [phone]  

## 2013-09-20 ENCOUNTER — Ambulatory Visit (INDEPENDENT_AMBULATORY_CARE_PROVIDER_SITE_OTHER): Payer: 59 | Admitting: Family Medicine

## 2013-09-20 ENCOUNTER — Telehealth: Payer: Self-pay | Admitting: Family Medicine

## 2013-09-20 ENCOUNTER — Encounter: Payer: Self-pay | Admitting: Family Medicine

## 2013-09-20 VITALS — BP 118/86 | HR 87 | Temp 97.4°F | Ht 70.0 in | Wt 221.0 lb

## 2013-09-20 DIAGNOSIS — E785 Hyperlipidemia, unspecified: Secondary | ICD-10-CM

## 2013-09-20 DIAGNOSIS — E1165 Type 2 diabetes mellitus with hyperglycemia: Secondary | ICD-10-CM

## 2013-09-20 DIAGNOSIS — IMO0001 Reserved for inherently not codable concepts without codable children: Secondary | ICD-10-CM

## 2013-09-20 DIAGNOSIS — F3289 Other specified depressive episodes: Secondary | ICD-10-CM

## 2013-09-20 DIAGNOSIS — I10 Essential (primary) hypertension: Secondary | ICD-10-CM

## 2013-09-20 DIAGNOSIS — F329 Major depressive disorder, single episode, unspecified: Secondary | ICD-10-CM

## 2013-09-20 DIAGNOSIS — J329 Chronic sinusitis, unspecified: Secondary | ICD-10-CM

## 2013-09-20 DIAGNOSIS — E119 Type 2 diabetes mellitus without complications: Secondary | ICD-10-CM

## 2013-09-20 MED ORDER — NOVOLOG FLEXPEN 100 UNIT/ML ~~LOC~~ SOPN: PEN_INJECTOR | SUBCUTANEOUS | Status: DC

## 2013-09-20 MED ORDER — INSULIN GLARGINE 100 UNIT/ML ~~LOC~~ SOLN: 44.0000 [IU] | Freq: Every day | SUBCUTANEOUS | Status: DC

## 2013-09-20 MED ORDER — AMOXICILLIN-POT CLAVULANATE 875-125 MG PO TABS: 1.0000 | ORAL_TABLET | Freq: Two times a day (BID) | ORAL | Status: DC

## 2013-09-20 MED ORDER — OXYCODONE-ACETAMINOPHEN 5-325 MG PO TABS: 1.0000 | ORAL_TABLET | Freq: Three times a day (TID) | ORAL | Status: DC | PRN

## 2013-09-20 NOTE — Telephone Encounter (Signed)
LAB ORDER WEEK OFLabs lipid, renal, cbc, tsh, hgba1c, hepatic  12-19 2014

## 2013-09-22 ENCOUNTER — Encounter: Payer: Self-pay | Admitting: Family Medicine

## 2013-09-22 NOTE — Assessment & Plan Note (Signed)
Well controlled, no changes 

## 2013-09-22 NOTE — Assessment & Plan Note (Signed)
Avoid trans fats, increase exercise, tolerating Atorvastatin
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
## 2013-09-22 NOTE — Assessment & Plan Note (Signed)
Improving encouraged to increase Lantus insulin by 2 more units and minimize simple carbs.

## 2013-09-22 NOTE — Assessment & Plan Note (Addendum)
Doing well with improved mood since starting Cymbalta continue same

## 2013-09-25 ENCOUNTER — Other Ambulatory Visit: Payer: Self-pay | Admitting: Family Medicine

## 2013-09-25 ENCOUNTER — Telehealth: Payer: Self-pay | Admitting: *Deleted

## 2013-09-25 NOTE — Telephone Encounter (Signed)
Can refill Diazepam same sig, same strength, 60

## 2013-09-25 NOTE — Telephone Encounter (Signed)
Faxed refill request received from pharmacy for Diazepam Last filled by MD on 10.07.14, #60x0 Last AEX - 10.31.14 Next AEX - 6 Weeks Please Advise/SLS

## 2013-09-26 MED ORDER — DIAZEPAM 5 MG PO TABS: ORAL_TABLET | ORAL | Status: DC

## 2013-09-26 NOTE — Telephone Encounter (Signed)
Rx request phoned to pharmacy/SLS  

## 2013-10-01 ENCOUNTER — Other Ambulatory Visit: Payer: Self-pay | Admitting: Family Medicine

## 2013-10-01 ENCOUNTER — Encounter: Payer: Self-pay | Admitting: Family Medicine

## 2013-10-01 DIAGNOSIS — R52 Pain, unspecified: Secondary | ICD-10-CM

## 2013-10-01 MED ORDER — HYDROCODONE-ACETAMINOPHEN 7.5-325 MG PO TABS: 1.0000 | ORAL_TABLET | Freq: Three times a day (TID) | ORAL | Status: DC | PRN

## 2013-10-02 ENCOUNTER — Other Ambulatory Visit: Payer: Self-pay | Admitting: Family Medicine

## 2013-10-02 DIAGNOSIS — J329 Chronic sinusitis, unspecified: Secondary | ICD-10-CM

## 2013-10-02 MED ORDER — AMOXICILLIN-POT CLAVULANATE 875-125 MG PO TABS: 1.0000 | ORAL_TABLET | Freq: Two times a day (BID) | ORAL | Status: DC

## 2013-10-02 NOTE — Telephone Encounter (Signed)
Melissa signed Rx today and it has been placed at front desk for pick up. Pt has not been notified. Please advise re: abx refill.

## 2013-10-09 ENCOUNTER — Encounter (INDEPENDENT_AMBULATORY_CARE_PROVIDER_SITE_OTHER): Payer: Self-pay | Admitting: Ophthalmology

## 2013-10-10 ENCOUNTER — Ambulatory Visit (INDEPENDENT_AMBULATORY_CARE_PROVIDER_SITE_OTHER): Payer: Self-pay | Admitting: Family Medicine

## 2013-10-10 VITALS — BP 147/101 | Wt 224.4 lb

## 2013-10-10 DIAGNOSIS — E119 Type 2 diabetes mellitus without complications: Secondary | ICD-10-CM

## 2013-10-10 NOTE — Progress Notes (Signed)
Patient presents today for DM follow-up as part of employer-sponsored Link to [employer]. Medications, glucose readings, a1c and compliance have been reviewed. I have also discussed with patient lifestyle interventions including diet and exercise. Details of the visit can be found in [employer] documenting program through [employer] [facility]). Patient has set a series of personal goals and will follow-up in no more than 3 months for further review of DM.  Noted HTN (BP 174/94 on first check, second check reduced to 147/101). Pt. Reports recent increase in use of Sudafed. Recommended discontinuation of pseudoephedrine and initiation of phenylephrine if a decongestant is necessary. Pt. Blood glucoses continue to swing wildly with severe hypoglycemia at least twice weekly and significant hyperglycemia at least twice weekly. I have recommended less aggressive Novolog bolusing, especially when on an empty stomach and not planning to eat. To consider insulin pump in 2015 if patient and Dr. Abner Greenspan are interested. Pt. Has had previous success on a pump but failed due to a combination of lack of provider care and finances, per patient report.

## 2013-10-18 ENCOUNTER — Other Ambulatory Visit: Payer: Self-pay | Admitting: Family Medicine

## 2013-10-18 NOTE — Telephone Encounter (Addendum)
eScribe request for refill on Diazepam 5 mg Last filled - 11.06.14, #60x0 eScribe request for refill on Gabapentin 300 mg Last filled - 10.07.14, #270x1 Last AEX - 10.31.14 Next AEX - 6 Weeks Please Advise/SLS

## 2013-10-21 NOTE — Telephone Encounter (Signed)
RX faxed

## 2013-10-22 ENCOUNTER — Other Ambulatory Visit: Payer: Self-pay | Admitting: Family

## 2013-10-22 ENCOUNTER — Other Ambulatory Visit: Payer: Self-pay | Admitting: Family Medicine

## 2013-11-12 ENCOUNTER — Ambulatory Visit: Payer: 59 | Admitting: Family Medicine

## 2013-11-12 NOTE — Progress Notes (Signed)
Patient ID: [name], male   DOB: 1971/03/24, 42 y.o.   MRN: [id number] ATTENDING PHYSICIAN NOTE: I have reviewed the chart and agree with the plan as detailed above. [name] MD Pager [phone]

## 2013-11-18 ENCOUNTER — Other Ambulatory Visit: Payer: Self-pay | Admitting: Family Medicine

## 2013-12-12 ENCOUNTER — Other Ambulatory Visit: Payer: Self-pay | Admitting: Family Medicine

## 2013-12-12 NOTE — Telephone Encounter (Signed)
Last OV: 09/20/2013 Last filled: 10/28/014 #90, R Please advise, SW

## 2013-12-13 ENCOUNTER — Other Ambulatory Visit: Payer: Self-pay

## 2013-12-13 MED ORDER — DIAZEPAM 5 MG PO TABS: 5.0000 mg | ORAL_TABLET | Freq: Two times a day (BID) | ORAL | Status: DC | PRN

## 2013-12-13 NOTE — Telephone Encounter (Signed)
Pharmacy states that they didn't get the diazepam refill/ I will resend and reprint

## 2013-12-16 ENCOUNTER — Telehealth: Payer: Self-pay | Admitting: Family Medicine

## 2013-12-16 ENCOUNTER — Other Ambulatory Visit: Payer: Self-pay | Admitting: Internal Medicine

## 2013-12-16 ENCOUNTER — Telehealth: Payer: Self-pay | Admitting: *Deleted

## 2013-12-16 MED ORDER — TRAMADOL HCL 50 MG PO TABS: ORAL_TABLET | ORAL | Status: DC

## 2013-12-16 NOTE — Telephone Encounter (Signed)
Rx request to pharmacy/SLS  

## 2013-12-16 NOTE — Telephone Encounter (Signed)
Pt presented to the office requesting refill of tramadol rx.  States his wife just had surgery (is unable to drive) and he had to catch a ride to our office and would like to get rx at [place] today due to lack of transportation.  Last Rx provided 08/27/13, #90 x 3 refills.  Pt is waiting in lobby, please advise.

## 2013-12-16 NOTE — Telephone Encounter (Signed)
He needs his refill of tramadol 50mg .  He does not drive.  His wife had rotator cuff surgery and she cant drive right now.  He would like to pick up now

## 2013-12-16 NOTE — Telephone Encounter (Signed)
rx signed and given to pt by cma.

## 2014-01-01 ENCOUNTER — Ambulatory Visit (INDEPENDENT_AMBULATORY_CARE_PROVIDER_SITE_OTHER): Payer: Self-pay | Admitting: Family Medicine

## 2014-01-01 DIAGNOSIS — E119 Type 2 diabetes mellitus without complications: Secondary | ICD-10-CM

## 2014-01-01 NOTE — Progress Notes (Signed)
Patient presents today for DM follow-up as part of employer-sponsored Link to [employer]. Medications, glucose readings, a1c and compliance have been reviewed. I have also discussed with patient lifestyle interventions including diet and exercise. Details of the visit can be found in [employer] documenting program through [facility]). Patient has set a series of personal goals and will follow-up in no more than 3 months for further review of DM.    A1c 8.1. Blood glucoses continue to fluctuate to extremes. Hypoglycemic and hyperglycemic events twice weekly or more, often on the same days. Blood glucoses have varied from 39 to 512 in past 3 weeks. Patient has been resistant to insulin pump in the past year due to financial constraints, but plan changes in 2015 makes the pump viable. [name] does have previous experience on the pump and it was successful but he was mildly annoyed by the physical constraints, but at this time he is more annoyed with the consistently uncontrolled DM.  [name] to see patient 01/02/14 to discuss potential for a pump.   Notable HTN today: 160/105; patient told to self-monitor at home for a week and report results to me and/or [name]. Under notably more stress due to recent role as caregiver of spouse who had surgery 12/12/13.

## 2014-01-02 ENCOUNTER — Ambulatory Visit (INDEPENDENT_AMBULATORY_CARE_PROVIDER_SITE_OTHER): Payer: 59 | Admitting: Family Medicine

## 2014-01-02 ENCOUNTER — Encounter: Payer: Self-pay | Admitting: Family Medicine

## 2014-01-02 VITALS — BP 110/86 | HR 99 | Temp 97.8°F | Ht 70.0 in | Wt 225.0 lb

## 2014-01-02 DIAGNOSIS — F411 Generalized anxiety disorder: Secondary | ICD-10-CM

## 2014-01-02 DIAGNOSIS — E119 Type 2 diabetes mellitus without complications: Secondary | ICD-10-CM

## 2014-01-02 DIAGNOSIS — E1165 Type 2 diabetes mellitus with hyperglycemia: Secondary | ICD-10-CM

## 2014-01-02 DIAGNOSIS — E785 Hyperlipidemia, unspecified: Secondary | ICD-10-CM

## 2014-01-02 DIAGNOSIS — Z23 Encounter for immunization: Secondary | ICD-10-CM

## 2014-01-02 DIAGNOSIS — IMO0002 Reserved for concepts with insufficient information to code with codable children: Secondary | ICD-10-CM

## 2014-01-02 DIAGNOSIS — I10 Essential (primary) hypertension: Secondary | ICD-10-CM

## 2014-01-02 DIAGNOSIS — M549 Dorsalgia, unspecified: Secondary | ICD-10-CM

## 2014-01-02 DIAGNOSIS — IMO0001 Reserved for inherently not codable concepts without codable children: Secondary | ICD-10-CM

## 2014-01-02 DIAGNOSIS — G43909 Migraine, unspecified, not intractable, without status migrainosus: Secondary | ICD-10-CM

## 2014-01-02 DIAGNOSIS — G609 Hereditary and idiopathic neuropathy, unspecified: Secondary | ICD-10-CM

## 2014-01-02 LAB — RENAL FUNCTION PANEL
ALBUMIN: 4.5 g/dL (ref 3.5–5.2)
BUN: 14 mg/dL (ref 6–23)
CALCIUM: 9.1 mg/dL (ref 8.4–10.5)
CO2: 27 mEq/L (ref 19–32)
Chloride: 99 mEq/L (ref 96–112)
Creat: 0.91 mg/dL (ref 0.50–1.35)
GLUCOSE: 298 mg/dL — AB (ref 70–99)
PHOSPHORUS: 3.9 mg/dL (ref 2.3–4.6)
Potassium: 4.4 mEq/L (ref 3.5–5.3)
SODIUM: 135 meq/L (ref 135–145)

## 2014-01-02 LAB — LIPID PANEL
CHOL/HDL RATIO: 3.3 ratio
Cholesterol: 116 mg/dL (ref 0–200)
HDL: 35 mg/dL — AB (ref >39)
LDL Cholesterol: 44 mg/dL (ref 0–99)
Triglycerides: 187 mg/dL — ABNORMAL HIGH (ref <150)
VLDL: 37 mg/dL (ref 0–40)

## 2014-01-02 LAB — CBC
HCT: 40.8 % (ref 39.0–52.0)
HEMOGLOBIN: 14.3 g/dL (ref 13.0–17.0)
MCH: 31.4 pg (ref 26.0–34.0)
MCHC: 35 g/dL (ref 30.0–36.0)
MCV: 89.5 fL (ref 78.0–100.0)
PLATELETS: 180 10*3/uL (ref 150–400)
RBC: 4.56 MIL/uL (ref 4.22–5.81)
RDW: 14.3 % (ref 11.5–15.5)
WBC: 8.1 10*3/uL (ref 4.0–10.5)

## 2014-01-02 LAB — HEPATIC FUNCTION PANEL
ALT: 19 U/L (ref 0–53)
AST: 17 U/L (ref 0–37)
Albumin: 4.5 g/dL (ref 3.5–5.2)
Alkaline Phosphatase: 99 U/L (ref 39–117)
BILIRUBIN DIRECT: 0.1 mg/dL (ref 0.0–0.3)
BILIRUBIN TOTAL: 0.3 mg/dL (ref 0.2–1.2)
Indirect Bilirubin: 0.2 mg/dL (ref 0.2–1.2)
Total Protein: 6.9 g/dL (ref 6.0–8.3)

## 2014-01-02 LAB — HEMOGLOBIN A1C
Hgb A1c MFr Bld: 8.1 % — ABNORMAL HIGH (ref <5.7)
Mean Plasma Glucose: 186 mg/dL — ABNORMAL HIGH (ref <117)

## 2014-01-02 LAB — TSH: TSH: 0.502 u[IU]/mL (ref 0.350–4.500)

## 2014-01-02 MED ORDER — NOVOLOG FLEXPEN 100 UNIT/ML ~~LOC~~ SOPN: PEN_INJECTOR | SUBCUTANEOUS | Status: DC

## 2014-01-02 MED ORDER — TRAMADOL HCL 50 MG PO TABS: 50.0000 mg | ORAL_TABLET | Freq: Four times a day (QID) | ORAL | Status: DC | PRN

## 2014-01-02 MED ORDER — GABAPENTIN 300 MG PO CAPS: 900.0000 mg | ORAL_CAPSULE | Freq: Three times a day (TID) | ORAL | Status: DC

## 2014-01-02 MED ORDER — DIAZEPAM 5 MG PO TABS: 5.0000 mg | ORAL_TABLET | Freq: Two times a day (BID) | ORAL | Status: DC | PRN

## 2014-01-02 MED ORDER — INSULIN GLARGINE 100 UNIT/ML ~~LOC~~ SOLN: 42.0000 [IU] | Freq: Every day | SUBCUTANEOUS | Status: DC

## 2014-01-02 NOTE — Progress Notes (Signed)
Pre visit review using our clinic review tool, if applicable. No additional management support is needed unless otherwise documented below in the visit note. 

## 2014-01-02 NOTE — Patient Instructions (Signed)

## 2014-01-03 ENCOUNTER — Telehealth: Payer: Self-pay | Admitting: Family Medicine

## 2014-01-03 ENCOUNTER — Telehealth: Payer: Self-pay

## 2014-01-03 NOTE — Telephone Encounter (Signed)
Relevant patient education assigned to patient using Emmi. ° °
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
## 2014-01-05 NOTE — Assessment & Plan Note (Signed)
Well controlled no changes 

## 2014-01-05 NOTE — Assessment & Plan Note (Signed)
Responds to Imitrex

## 2014-01-05 NOTE — Assessment & Plan Note (Signed)
Tolerating Atorvastatin, avoid trans fats 

## 2014-01-05 NOTE — Assessment & Plan Note (Signed)
Patient is poorly controlled and is interested in pursuing an insulin pump again. He has had a pump in the past but now realizes he was better controlled, will have him increase his insulin, minimize his carbs and refer him back to endocrinology for consideration

## 2014-01-13 ENCOUNTER — Ambulatory Visit: Payer: 59 | Admitting: Endocrinology

## 2014-01-19 ENCOUNTER — Emergency Department (HOSPITAL_BASED_OUTPATIENT_CLINIC_OR_DEPARTMENT_OTHER)
Admission: EM | Admit: 2014-01-19 | Discharge: 2014-01-19 | Disposition: A | Discharge status: Home / Self Care | Payer: 59 | Attending: Emergency Medicine | Admitting: Emergency Medicine

## 2014-01-19 ENCOUNTER — Emergency Department (HOSPITAL_BASED_OUTPATIENT_CLINIC_OR_DEPARTMENT_OTHER): Payer: 59

## 2014-01-19 ENCOUNTER — Encounter (HOSPITAL_BASED_OUTPATIENT_CLINIC_OR_DEPARTMENT_OTHER): Payer: Self-pay | Admitting: Emergency Medicine

## 2014-01-19 DIAGNOSIS — Z79899 Other long term (current) drug therapy: Secondary | ICD-10-CM | POA: Insufficient documentation

## 2014-01-19 DIAGNOSIS — F3289 Other specified depressive episodes: Secondary | ICD-10-CM | POA: Insufficient documentation

## 2014-01-19 DIAGNOSIS — F329 Major depressive disorder, single episode, unspecified: Secondary | ICD-10-CM | POA: Insufficient documentation

## 2014-01-19 DIAGNOSIS — I10 Essential (primary) hypertension: Secondary | ICD-10-CM | POA: Insufficient documentation

## 2014-01-19 DIAGNOSIS — Y9301 Activity, walking, marching and hiking: Secondary | ICD-10-CM | POA: Insufficient documentation

## 2014-01-19 DIAGNOSIS — G2581 Restless legs syndrome: Secondary | ICD-10-CM | POA: Insufficient documentation

## 2014-01-19 DIAGNOSIS — E119 Type 2 diabetes mellitus without complications: Secondary | ICD-10-CM | POA: Insufficient documentation

## 2014-01-19 DIAGNOSIS — E785 Hyperlipidemia, unspecified: Secondary | ICD-10-CM | POA: Insufficient documentation

## 2014-01-19 DIAGNOSIS — S82201A Unspecified fracture of shaft of right tibia, initial encounter for closed fracture: Secondary | ICD-10-CM

## 2014-01-19 DIAGNOSIS — K219 Gastro-esophageal reflux disease without esophagitis: Secondary | ICD-10-CM | POA: Insufficient documentation

## 2014-01-19 DIAGNOSIS — R296 Repeated falls: Secondary | ICD-10-CM | POA: Insufficient documentation

## 2014-01-19 DIAGNOSIS — G43909 Migraine, unspecified, not intractable, without status migrainosus: Secondary | ICD-10-CM | POA: Insufficient documentation

## 2014-01-19 DIAGNOSIS — F17200 Nicotine dependence, unspecified, uncomplicated: Secondary | ICD-10-CM | POA: Insufficient documentation

## 2014-01-19 DIAGNOSIS — Z794 Long term (current) use of insulin: Secondary | ICD-10-CM | POA: Insufficient documentation

## 2014-01-19 DIAGNOSIS — Y92009 Unspecified place in unspecified non-institutional (private) residence as the place of occurrence of the external cause: Secondary | ICD-10-CM | POA: Insufficient documentation

## 2014-01-19 DIAGNOSIS — S82899A Other fracture of unspecified lower leg, initial encounter for closed fracture: Secondary | ICD-10-CM | POA: Insufficient documentation

## 2014-01-19 LAB — CBG MONITORING, ED: Glucose-Capillary: 107 mg/dL — ABNORMAL HIGH (ref 70–99)

## 2014-01-19 MED ORDER — OXYCODONE-ACETAMINOPHEN 5-325 MG PO TABS: 2.0000 | ORAL_TABLET | Freq: Four times a day (QID) | ORAL | Status: DC | PRN

## 2014-01-19 MED ORDER — OXYCODONE-ACETAMINOPHEN 5-325 MG PO TABS
2.0000 | ORAL_TABLET | Freq: Once | ORAL | Status: AC
  Administered 2014-01-19: 2 via ORAL
  Filled 2014-01-19: qty 2

## 2014-01-19 NOTE — ED Notes (Signed)
Patient states that he is diabetic and had low blood sugar earlier today and as a result tried to walk from the kitchen after eating and fell, injuring his right ankle. States the he fell 2 more times, injuring the same ankle again. States that he "accidently" took too much insulin and that is why his sugar was low. Main concern is his right leg.

## 2014-01-19 NOTE — ED Notes (Signed)
MD at bedside. 
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
## 2014-01-19 NOTE — Discharge Instructions (Signed)
Take motrin 800 mg every 6 hrs for pain.  Take percocet as prescribed for severe pain. Do NOT drive.   Non weight bearing on R leg. Use crutches.   Follow up with Dr. Tonita Cong tomorrow.   Return to ER if you have worse pain, swelling, unable to feel your toes.

## 2014-01-19 NOTE — ED Notes (Signed)
Pt states his sister is picking him up- EMT at bedside applying splint

## 2014-01-20 ENCOUNTER — Ambulatory Visit: Payer: 59 | Admitting: Endocrinology

## 2014-01-20 ENCOUNTER — Other Ambulatory Visit: Payer: Self-pay | Admitting: Family

## 2014-01-28 NOTE — Progress Notes (Signed)
Patient ID: [name], male   DOB: 12/25/1970, 43 y.o.   MRN: [id number] ATTENDING PHYSICIAN NOTE: I have reviewed the chart and agree with the plan as detailed above. Aneka Fagerstrom MD Pager [phone]  

## 2014-01-29 ENCOUNTER — Other Ambulatory Visit: Payer: Self-pay | Admitting: Family Medicine

## 2014-01-29 DIAGNOSIS — M549 Dorsalgia, unspecified: Secondary | ICD-10-CM

## 2014-01-29 NOTE — Telephone Encounter (Signed)
Requesting refill on tramdol

## 2014-01-30 ENCOUNTER — Other Ambulatory Visit: Payer: Self-pay | Admitting: Family

## 2014-01-31 ENCOUNTER — Ambulatory Visit (INDEPENDENT_AMBULATORY_CARE_PROVIDER_SITE_OTHER): Payer: 59 | Admitting: Family Medicine

## 2014-01-31 ENCOUNTER — Encounter: Payer: Self-pay | Admitting: Family Medicine

## 2014-01-31 VITALS — BP 142/100 | HR 87 | Temp 97.9°F | Ht 70.0 in | Wt 224.1 lb

## 2014-01-31 DIAGNOSIS — E1165 Type 2 diabetes mellitus with hyperglycemia: Secondary | ICD-10-CM

## 2014-01-31 DIAGNOSIS — I10 Essential (primary) hypertension: Secondary | ICD-10-CM

## 2014-01-31 DIAGNOSIS — IMO0002 Reserved for concepts with insufficient information to code with codable children: Secondary | ICD-10-CM

## 2014-01-31 DIAGNOSIS — S82209A Unspecified fracture of shaft of unspecified tibia, initial encounter for closed fracture: Secondary | ICD-10-CM

## 2014-01-31 DIAGNOSIS — E785 Hyperlipidemia, unspecified: Secondary | ICD-10-CM

## 2014-01-31 DIAGNOSIS — IMO0001 Reserved for inherently not codable concepts without codable children: Secondary | ICD-10-CM

## 2014-01-31 DIAGNOSIS — S82201A Unspecified fracture of shaft of right tibia, initial encounter for closed fracture: Secondary | ICD-10-CM

## 2014-01-31 MED ORDER — OXYCODONE-ACETAMINOPHEN 7.5-325 MG PO TABS: 2.0000 | ORAL_TABLET | ORAL | Status: DC | PRN

## 2014-01-31 MED ORDER — TRAMADOL HCL 50 MG PO TABS: 50.0000 mg | ORAL_TABLET | Freq: Four times a day (QID) | ORAL | Status: DC | PRN

## 2014-01-31 NOTE — Patient Instructions (Signed)
Tibial Fracture, Adult You have a fracture (break in bone) of your tibia. This is the large "shin" bone in your lower leg. These fractures are easily diagnosed with x-rays. TREATMENT  You have a simple fracture which usually will heal without disability. It can be treated with simple immobilization. This means the bone can be held with a cast or splint in a favorable position until your caregiver feels it is stable (healed well enough). Then you can begin range of motion exercises to keep your knee and ankle limber (moving well). HOME CARE INSTRUCTIONS   Apply ice to the injury for 15-20 minutes, 03-04 times per day while awake, for 2 days. Put the ice in a plastic bag and place a thin towel between the bag of ice and your cast.  If you have a plaster or fiberglass cast:  Do not try to scratch the skin under the cast using sharp or pointed objects.  Check the skin around the cast every day. You may put lotion on any red or sore areas.  Keep your cast dry and clean.  If you have a plaster splint:  Wear the splint as directed.  You may loosen the elastic around the splint if your toes become numb, tingle, or turn cold or blue.  Do not put pressure on any part of your cast or splint until it is fully hardened.  Your cast or splint can be protected during bathing with a plastic bag. Do not lower the cast or splint into water.  Use crutches as directed.  Only take over-the-counter or prescription medicines for pain, discomfort, or fever as directed by your caregiver.  See your caregiver as directed. It is very important to keep all follow-up referrals and appointments in order to avoid any long-term problems with your leg and ankle including chronic pain, inability to move the ankle normally, failure of the fracture to heal and permanent disability. SEEK IMMEDIATE MEDICAL CARE IF:   Pain is becoming worse rather than better, or if pain is uncontrolled with medications.  You have  increased swelling, pain, or redness in the foot.  You begin to lose feeling in your foot or toes.  You develop a cold or blue foot or toes on the injured side.  You develop severe pain in your injured leg, especially if it is increased with movement of your toes. Document Released: 08/02/2001 Document Revised: 01/30/2012 Document Reviewed: 02/24/2009 [employer] Patient Information 2014 [employer].  

## 2014-01-31 NOTE — Progress Notes (Signed)
Pre visit review using our clinic review tool, if applicable. No additional management support is needed unless otherwise documented below in the visit note. 

## 2014-01-31 NOTE — Telephone Encounter (Signed)
Refill request for tramadol Last filled by MD on - 01/02/2014 #120 x0 Last Appt: 01/02/2014 Next Appt: 01/31/2014 Please advise refill?

## 2014-02-05 ENCOUNTER — Encounter: Payer: Self-pay | Admitting: Family Medicine

## 2014-02-05 DIAGNOSIS — S82201A Unspecified fracture of shaft of right tibia, initial encounter for closed fracture: Secondary | ICD-10-CM | POA: Insufficient documentation
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
## 2014-02-05 NOTE — Assessment & Plan Note (Signed)
encouraged minimize carbs, increase insulin to 60 units

## 2014-02-05 NOTE — Assessment & Plan Note (Signed)
Improved on recheck. Well controlled, no changes to meds. Encouraged heart healthy diet such as the DASH diet and exercise as tolerated.  

## 2014-02-05 NOTE — Assessment & Plan Note (Signed)
Following with Dr Delrae Sawyers, has MRI coming up this week to assess fracture. Continue to wear immobility boot.

## 2014-02-05 NOTE — Assessment & Plan Note (Signed)
Tolerating statin, encouraged heart healthy diet, avoid trans fats, minimize simple carbs and saturated fats. Increase exercise as tolerated 

## 2014-02-24 ENCOUNTER — Other Ambulatory Visit: Payer: Self-pay | Admitting: Family Medicine

## 2014-02-24 ENCOUNTER — Other Ambulatory Visit: Payer: Self-pay | Admitting: Internal Medicine

## 2014-02-27 ENCOUNTER — Other Ambulatory Visit: Payer: Self-pay

## 2014-03-12 ENCOUNTER — Telehealth: Payer: Self-pay | Admitting: *Deleted

## 2014-03-12 DIAGNOSIS — M549 Dorsalgia, unspecified: Secondary | ICD-10-CM

## 2014-03-12 NOTE — Telephone Encounter (Signed)
Ok to refill 

## 2014-03-12 NOTE — Telephone Encounter (Signed)
Pt walked in to the office stating the pharmacy sent [place] a refill for his tramadol and he wanted to know why we have not filled it. Apologized to pt but I do not see recent request in EPIC. Advised pt request would be sent to Provider and he would be notified when refill was complete on 03/13/14. Last rx was provided on 01/31/14, #120 x no refills. Please advise.

## 2014-03-13 MED ORDER — TRAMADOL HCL 50 MG PO TABS: 50.0000 mg | ORAL_TABLET | Freq: Four times a day (QID) | ORAL | Status: DC | PRN

## 2014-03-18 ENCOUNTER — Other Ambulatory Visit: Payer: Self-pay | Admitting: Family Medicine

## 2014-04-04 ENCOUNTER — Ambulatory Visit (HOSPITAL_BASED_OUTPATIENT_CLINIC_OR_DEPARTMENT_OTHER)
Admission: RE | Admit: 2014-04-04 | Discharge: 2014-04-04 | Disposition: A | Discharge status: Home / Self Care | Payer: 59 | Source: Ambulatory Visit | Attending: Family Medicine | Admitting: Family Medicine

## 2014-04-04 ENCOUNTER — Ambulatory Visit (INDEPENDENT_AMBULATORY_CARE_PROVIDER_SITE_OTHER): Payer: 59 | Admitting: Family Medicine

## 2014-04-04 ENCOUNTER — Encounter: Payer: Self-pay | Admitting: Family Medicine

## 2014-04-04 VITALS — BP 142/98 | HR 84 | Temp 97.6°F | Ht 70.0 in | Wt 214.1 lb

## 2014-04-04 DIAGNOSIS — IMO0002 Reserved for concepts with insufficient information to code with codable children: Secondary | ICD-10-CM

## 2014-04-04 DIAGNOSIS — IMO0001 Reserved for inherently not codable concepts without codable children: Secondary | ICD-10-CM

## 2014-04-04 DIAGNOSIS — S82201A Unspecified fracture of shaft of right tibia, initial encounter for closed fracture: Secondary | ICD-10-CM

## 2014-04-04 DIAGNOSIS — E1165 Type 2 diabetes mellitus with hyperglycemia: Secondary | ICD-10-CM

## 2014-04-04 DIAGNOSIS — S82209A Unspecified fracture of shaft of unspecified tibia, initial encounter for closed fracture: Secondary | ICD-10-CM

## 2014-04-04 DIAGNOSIS — R52 Pain, unspecified: Secondary | ICD-10-CM

## 2014-04-04 DIAGNOSIS — Z4789 Encounter for other orthopedic aftercare: Secondary | ICD-10-CM | POA: Insufficient documentation

## 2014-04-04 DIAGNOSIS — I10 Essential (primary) hypertension: Secondary | ICD-10-CM

## 2014-04-04 MED ORDER — HYDROCODONE-ACETAMINOPHEN 7.5-325 MG PO TABS: 1.0000 | ORAL_TABLET | Freq: Three times a day (TID) | ORAL | Status: DC | PRN

## 2014-04-04 NOTE — Progress Notes (Signed)
Pre visit review using our clinic review tool, if applicable. No additional management support is needed unless otherwise documented below in the visit note. 

## 2014-04-04 NOTE — Patient Instructions (Signed)
Tibial Fracture, Adult You have a fracture (break in bone) of your tibia. This is the large "shin" bone in your lower leg. These fractures are easily diagnosed with x-rays. TREATMENT  You have a simple fracture which usually will heal without disability. It can be treated with simple immobilization. This means the bone can be held with a cast or splint in a favorable position until your caregiver feels it is stable (healed well enough). Then you can begin range of motion exercises to keep your knee and ankle limber (moving well). HOME CARE INSTRUCTIONS   Apply ice to the injury for 15-20 minutes, 03-04 times per day while awake, for 2 days. Put the ice in a plastic bag and place a thin towel between the bag of ice and your cast.  If you have a plaster or fiberglass cast:  Do not try to scratch the skin under the cast using sharp or pointed objects.  Check the skin around the cast every day. You may put lotion on any red or sore areas.  Keep your cast dry and clean.  If you have a plaster splint:  Wear the splint as directed.  You may loosen the elastic around the splint if your toes become numb, tingle, or turn cold or blue.  Do not put pressure on any part of your cast or splint until it is fully hardened.  Your cast or splint can be protected during bathing with a plastic bag. Do not lower the cast or splint into water.  Use crutches as directed.  Only take over-the-counter or prescription medicines for pain, discomfort, or fever as directed by your caregiver.  See your caregiver as directed. It is very important to keep all follow-up referrals and appointments in order to avoid any long-term problems with your leg and ankle including chronic pain, inability to move the ankle normally, failure of the fracture to heal and permanent disability. SEEK IMMEDIATE MEDICAL CARE IF:   Pain is becoming worse rather than better, or if pain is uncontrolled with medications.  You have  increased swelling, pain, or redness in the foot.  You begin to lose feeling in your foot or toes.  You develop a cold or blue foot or toes on the injured side.  You develop severe pain in your injured leg, especially if it is increased with movement of your toes. Document Released: 08/02/2001 Document Revised: 01/30/2012 Document Reviewed: 02/24/2009 [facility] Patient Information 2014 [place], [place].

## 2014-04-06 ENCOUNTER — Encounter: Payer: Self-pay | Admitting: Family Medicine
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
## 2014-04-06 NOTE — Assessment & Plan Note (Signed)
Elevated with increased pain, encouraged DASH diet but no change in meds

## 2014-04-06 NOTE — Assessment & Plan Note (Signed)
Healing slowly and pain increased with increased use. Has a proximal spiral fracture as well. Encouraged to minimize weight bearing and restart his boot. Given pain meds to use and encouraged to return to ortho

## 2014-04-06 NOTE — Assessment & Plan Note (Signed)
Is doing a better job of controlling his sugars. No change in meds

## 2014-04-22 ENCOUNTER — Other Ambulatory Visit: Payer: Self-pay

## 2014-04-22 ENCOUNTER — Other Ambulatory Visit: Payer: Self-pay | Admitting: Family Medicine

## 2014-04-22 DIAGNOSIS — R52 Pain, unspecified: Secondary | ICD-10-CM

## 2014-04-22 MED ORDER — HYDROCODONE-ACETAMINOPHEN 7.5-325 MG PO TABS: 1.0000 | ORAL_TABLET | Freq: Three times a day (TID) | ORAL | Status: DC | PRN

## 2014-04-22 NOTE — Telephone Encounter (Signed)
Pt left a message stating that he would like the hydrocodone refilled? He stated he would pick this up on Friday 04-25-14.  Please advise? Last RX was 04-04-14 quantity 60 with 0 refills.  Pt states he is still having pain with his leg

## 2014-04-22 NOTE — Telephone Encounter (Signed)
Pt informed that this is ready to be picked up

## 2014-04-23 ENCOUNTER — Other Ambulatory Visit: Payer: Self-pay | Admitting: Family Medicine

## 2014-04-24 NOTE — Telephone Encounter (Signed)
Printed rx for md to sign and fax  Last RX was done on 01-02-14 quantity 60 with 3 refills

## 2014-04-29 ENCOUNTER — Other Ambulatory Visit: Payer: Self-pay | Admitting: Family Medicine

## 2014-04-29 NOTE — Telephone Encounter (Signed)
Please advise? Last RX was done on 03-15-14 quantity 120 with 0 refills

## 2014-05-08 ENCOUNTER — Ambulatory Visit (INDEPENDENT_AMBULATORY_CARE_PROVIDER_SITE_OTHER): Payer: Self-pay | Admitting: Family Medicine

## 2014-05-08 VITALS — BP 125/93 | Ht 70.0 in | Wt 218.2 lb

## 2014-05-08 DIAGNOSIS — E119 Type 2 diabetes mellitus without complications: Secondary | ICD-10-CM

## 2014-05-08 NOTE — Progress Notes (Signed)
Patient presents today for DM follow-up as part of employer-sponsored Link to [employer]. Medications, glucose readings, a1c and compliance have been reviewed. I have also discussed with patient lifestyle interventions including diet and exercise. Details of the visit can be found in [employer] documenting program through [facility]). Patient has set a series of personal goals and will follow-up in no more than 3 months for further review of DM.  Today's visit focused on his need to schedule an appointment (and keep appointment) with endocrinology. I had him call [facility] and start the process before leaving today. He is awaiting callback to schedule appointment.

## 2014-05-20 ENCOUNTER — Other Ambulatory Visit: Payer: Self-pay

## 2014-05-20 DIAGNOSIS — R52 Pain, unspecified: Secondary | ICD-10-CM

## 2014-05-20 MED ORDER — HYDROCODONE-ACETAMINOPHEN 7.5-325 MG PO TABS: 1.0000 | ORAL_TABLET | Freq: Three times a day (TID) | ORAL | Status: DC | PRN

## 2014-05-20 NOTE — Telephone Encounter (Signed)
Pt called asking for a refill of his Hydrocodone.  RX printed and given to md to put at front desk.   Pt informed that he can get this tomorrow.

## 2014-05-21 ENCOUNTER — Other Ambulatory Visit: Payer: Self-pay | Admitting: *Deleted

## 2014-05-21 ENCOUNTER — Encounter: Payer: Self-pay | Admitting: Endocrinology

## 2014-05-21 ENCOUNTER — Ambulatory Visit (INDEPENDENT_AMBULATORY_CARE_PROVIDER_SITE_OTHER): Payer: 59 | Admitting: Endocrinology

## 2014-05-21 VITALS — BP 124/88 | HR 100 | Temp 98.0°F | Resp 14 | Ht 70.25 in | Wt 210.6 lb

## 2014-05-21 DIAGNOSIS — E1049 Type 1 diabetes mellitus with other diabetic neurological complication: Secondary | ICD-10-CM

## 2014-05-21 DIAGNOSIS — E1065 Type 1 diabetes mellitus with hyperglycemia: Principal | ICD-10-CM

## 2014-05-21 DIAGNOSIS — E1142 Type 2 diabetes mellitus with diabetic polyneuropathy: Secondary | ICD-10-CM | POA: Insufficient documentation

## 2014-05-21 MED ORDER — INSULIN GLARGINE 300 UNIT/ML ~~LOC~~ SOPN: 50.0000 [IU] | PEN_INJECTOR | Freq: Every day | SUBCUTANEOUS | Status: DC
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
## 2014-05-21 NOTE — Patient Instructions (Addendum)
Check cost of Toujeo and covered start 48 units in the morning  If using Lantus changed the dose to am, 44 units daily and adjust every 3 days to keep morning sugar in the 100-150 range  Mealtime insulin: May continue to take this right after eating. Use carbohydrate counting to cover one unit for every 5 g Correction factor for high readings: Use one unit for every 25 mg over 150. Keep your target at 150  Check blood sugar before each meal or at least 3 times a day and some readings after supper  Occasionally check blood sugar at 3 AM also

## 2014-05-27 ENCOUNTER — Other Ambulatory Visit: Payer: Self-pay | Admitting: Family Medicine

## 2014-06-11 ENCOUNTER — Ambulatory Visit (INDEPENDENT_AMBULATORY_CARE_PROVIDER_SITE_OTHER): Payer: 59 | Admitting: Physician Assistant

## 2014-06-11 ENCOUNTER — Ambulatory Visit (INDEPENDENT_AMBULATORY_CARE_PROVIDER_SITE_OTHER): Payer: 59 | Admitting: Endocrinology

## 2014-06-11 ENCOUNTER — Encounter: Payer: Self-pay | Admitting: Physician Assistant

## 2014-06-11 ENCOUNTER — Encounter: Payer: Self-pay | Admitting: Endocrinology

## 2014-06-11 ENCOUNTER — Encounter: Payer: 59 | Attending: Endocrinology | Admitting: Nutrition

## 2014-06-11 ENCOUNTER — Telehealth: Payer: Self-pay

## 2014-06-11 VITALS — BP 140/108 | HR 97 | Temp 98.0°F | Resp 16 | Ht 70.0 in

## 2014-06-11 VITALS — BP 131/90 | HR 99 | Temp 98.0°F | Resp 16 | Ht 70.25 in | Wt 204.8 lb

## 2014-06-11 DIAGNOSIS — E1049 Type 1 diabetes mellitus with other diabetic neurological complication: Secondary | ICD-10-CM

## 2014-06-11 DIAGNOSIS — IMO0002 Reserved for concepts with insufficient information to code with codable children: Secondary | ICD-10-CM

## 2014-06-11 DIAGNOSIS — R52 Pain, unspecified: Secondary | ICD-10-CM

## 2014-06-11 DIAGNOSIS — M272 Inflammatory conditions of jaws: Secondary | ICD-10-CM | POA: Insufficient documentation

## 2014-06-11 DIAGNOSIS — E1165 Type 2 diabetes mellitus with hyperglycemia: Secondary | ICD-10-CM

## 2014-06-11 DIAGNOSIS — E1065 Type 1 diabetes mellitus with hyperglycemia: Principal | ICD-10-CM

## 2014-06-11 MED ORDER — HYDROCODONE-ACETAMINOPHEN 7.5-325 MG PO TABS: 1.0000 | ORAL_TABLET | Freq: Three times a day (TID) | ORAL | Status: DC | PRN

## 2014-06-11 MED ORDER — AMOXICILLIN-POT CLAVULANATE 875-125 MG PO TABS: 1.0000 | ORAL_TABLET | Freq: Two times a day (BID) | ORAL | Status: DC

## 2014-06-11 NOTE — Patient Instructions (Signed)
Lantus 42 at 11 pm and try to get sugar < 150 in am
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
## 2014-06-11 NOTE — Telephone Encounter (Signed)
Pt contacted office and would like to have a ABX as well as something called in for pain due to him having an abscessed tooth.DDS Appointment is pending.

## 2014-06-11 NOTE — Patient Instructions (Signed)
Take antibiotic and hydrocodone for tooth/jaw pain. If symptoms worsen or change notify us. Keep dentist appointment in 10 days. Any questions pleas call.

## 2014-06-11 NOTE — Progress Notes (Signed)
   Subjective:    Patient ID: [name], male    DOB: 1971/04/29, 43 y.o.   MRN: [id number]  HPI  Pt has one week of left lower jaw area pain for about a week. Worse last 2 days. Pt states he has poor teeth. A lot of decay. Pt aware he needs likely extraction. Pt has appointment with dentist in 10 days. No fever but some chills occasionally.      Review of Systems  Constitutional: Positive for chills. Negative for fever.  HENT: Negative for congestion, drooling, facial swelling and sore throat.        Lt lower jaw pain.  Respiratory: Negative.   Cardiovascular: Negative.   Skin: Negative for color change, pallor and rash.  Hematological: Negative for adenopathy. Does not bruise/bleed easily.       Objective:   Physical Exam  Constitutional: He appears well-nourished. No distress.  HENT:  Head: Normocephalic and atraumatic.  Left mandible is tender mid portion. Mild swollen. On inside of his mouth bottom row/lt side  3-4 extremely decayed teeth.  Eyes: Conjunctivae and EOM are normal. Pupils are equal, round, and reactive to light.  Neck: Normal range of motion. Neck supple.  Cardiovascular: Normal rate, regular rhythm and normal heart sounds.   Lymphadenopathy:    He has no cervical adenopathy.  Skin: He is not diaphoretic.      Assessment & Plan:  Odontogenic infection of jaw Placed on augmentin and gave rx hydrocodone. If symptoms worsen or change notify us. Follow up with dentist in 10 days(Has appointment)   The above note belongs to Mackie Pai, PA-C who is completing his on-site EPIC training and orientation with me.  I have seen patient and agree with the aforementioned assessment and plan.  Brunetta Jeans, PA-C

## 2014-06-11 NOTE — Progress Notes (Signed)
Pre visit review using our clinic review tool, if applicable. No additional management support is needed unless otherwise documented below in the visit note. 

## 2014-06-11 NOTE — Telephone Encounter (Signed)
Can have Augmentin XR 1 tab po bid x 7 days but needs to see dentist soon. Cannot have pain meds without appt. Try Advil/Ibuprofen 200 mg tabs 2 tabs po tid with food

## 2014-06-11 NOTE — Assessment & Plan Note (Signed)
Placed on augmentin and gave rx hydrocodone. If symptoms worsen or change notify us. Follow up with dentist in 10 days(Has appointment)

## 2014-06-12 NOTE — Progress Notes (Signed)
Patient ID: [name], male   DOB: 1971/05/14, 43 y.o.   MRN: [id number] ATTENDING PHYSICIAN NOTE: I have reviewed the chart and agree with the plan as detailed above. Dorcas Mcmurray MD Pager [phone]

## 2014-06-12 NOTE — Telephone Encounter (Signed)
Left message on machine.

## 2014-06-13 ENCOUNTER — Ambulatory Visit: Payer: 59 | Admitting: Family Medicine

## 2014-06-18 ENCOUNTER — Other Ambulatory Visit: Payer: Self-pay | Admitting: Endocrinology

## 2014-06-18 ENCOUNTER — Other Ambulatory Visit: Payer: Self-pay | Admitting: *Deleted

## 2014-06-18 ENCOUNTER — Encounter: Payer: 59 | Admitting: Nutrition

## 2014-06-18 DIAGNOSIS — E1065 Type 1 diabetes mellitus with hyperglycemia: Principal | ICD-10-CM

## 2014-06-18 DIAGNOSIS — E1049 Type 1 diabetes mellitus with other diabetic neurological complication: Secondary | ICD-10-CM

## 2014-06-18 DIAGNOSIS — E1165 Type 2 diabetes mellitus with hyperglycemia: Secondary | ICD-10-CM

## 2014-06-18 DIAGNOSIS — IMO0002 Reserved for concepts with insufficient information to code with codable children: Secondary | ICD-10-CM

## 2014-06-18 MED ORDER — GLUCOSE BLOOD VI STRP: ORAL_STRIP | Status: DC

## 2014-06-18 MED ORDER — INSULIN ASPART 100 UNIT/ML ~~LOC~~ SOLN: SUBCUTANEOUS | Status: DC

## 2014-06-18 NOTE — Patient Instructions (Signed)
Read over literature given on carb counting. Go to [place] site and do pump start training Do first 4 chapters of Pump start work book

## 2014-06-18 NOTE — Progress Notes (Signed)
Discussed with this patient his readiness to begin pump therapy.  We reviewed carb counting, and he was given handouts on 15 gram portion sizes.  He says that he understands this, but has not been doing it.   He eats out frequently, and I will get him a Calorie Edison Pace book for him. He is not always taking is Long acting insulin, and sometimes taking his Novolog after meals.  We reviewed the importance of taking his fast acting insulin before his meals.  He is aware that he will need to test his blood sugars at least 7-8 times/day after starting on the pump, and call with readings to Dr. Dwyane Dee.  He has agreed to do all of this.    He was told to go to the [employer] web site and do the pump training, and to read the pump start work book.  He agreed to do this.    An appt. Was made for 1 week for the pump start.  He and his mother had no final questions.

## 2014-06-19 ENCOUNTER — Telehealth: Payer: Self-pay | Admitting: *Deleted

## 2014-06-19 NOTE — Telephone Encounter (Signed)
Patient called with his sugar readings  7/29- 3 pm- 224          7:29 pm- 281          9:40 pm-163           12:03 am- 63 7/30- 7 am fasting-89         9:30- 336

## 2014-06-19 NOTE — Telephone Encounter (Signed)
Noted, patient is aware. 

## 2014-06-19 NOTE — Telephone Encounter (Signed)
Basals  New= Midnight = 1.3. 7 AM = 2.0, 1 PM = 1.7, 10 pm=1.5

## 2014-06-24 ENCOUNTER — Telehealth: Payer: Self-pay | Admitting: Family Medicine

## 2014-06-24 MED ORDER — TRAMADOL HCL 50 MG PO TABS: 50.0000 mg | ORAL_TABLET | Freq: Four times a day (QID) | ORAL | Status: DC | PRN

## 2014-06-24 NOTE — Telephone Encounter (Signed)
Refill-tramadol  [facility]

## 2014-06-24 NOTE — Telephone Encounter (Signed)
OK to refill Tramadol 

## 2014-06-24 NOTE — Telephone Encounter (Signed)
Please advise high drug alert: Drug-Drug Interaction Report  Tramadol / Serotonin Norepi Reuptake Inhibitors  Significance: Major  Warning: Co-administration of DULoxetine with traMADol may result in the development of serotonin syndrome (eg. agitation, altered consciousness, ataxia, myoclonus, overactive reflexes, shivering).  Onset: Delayed Document Level: Possible  Interacting Medications/Orders:  Tramadol Oral or Non-Oral, Systemic Serotonin Norepi Reuptake Inhibitors Oral, Systemic  1. traMADol Order: traMADol (ULTRAM) 50 MG tablet Route: Oral Start: 06/24/2014 End: none Frequency: Every 6 hours PRN 1. DULoxetine Order ([id number]): DULoxetine (CYMBALTA) 60 MG capsule Route: none Start: 05/27/2014 End: none Frequency:    Management Code: Professional review suggested  Effects: Co-administration of DULoxetine with traMADol may result in the development of serotonin syndrome (eg. agitation, altered consciousness, ataxia, myoclonus, overactive reflexes, shivering).  Mechanism: The serotonergic effects of DULoxetine and traMADol may be additive.  Management: Patients receiving the combination of DULoxetine and traMADol should be closely monitored for adverse reactions. Serotonin syndrome requires immediate medical attention, including withdrawal of the serotonergic agents and supportive care.  Discussion: Official package labeling for Cymbalta (duloxetine) (9) and Savella (milnacipran) (10) state that based on the mechanism of action of serotonin norepinephrine reuptake inhibitors (SNRI) and selective serotonin reuptake inhibitors (SSRI), and the potential for serotonin syndrome, caution is advised when duloxetine is co-administered with other drugs, such as tramadol, that may affect the serotonergic neurotransmitter system. The majority of available literature relates to possible serotonin syndrome secondary to co-administration of tramadol with SSRIs, not SNRIs. A 43 year old woman  taking sertraline for more than one year developed symptoms typical of serotonin syndrome after starting tramadol (1); however, she was receiving other drugs that could have contributed to her symptoms. A 43 year old woman taking tramadol experienced serotonin syndrome following the first dose of sertraline that was added to her regimen (2). A 43 year old woman receiving fluoxetine for 3 years presented with serotonin syndrome 4 weeks after starting tramadol (3). Both drugs were stopped and she improved over 7 days. A 43 year old woman receiving fluoxetine for 10 years presented with piloerection and muscle contractions 18 days after starting tramadol (4). After stopping both drugs, symptoms subsided within 21 days. In another patient, serotonin syndrome occurred with fluoxetine and tramadol (5). A 43 year old woman receiving citalopram for 3 years developed serotonin syndrome symptoms after starting tramadol (6). Symptoms subsided after tramadol was discontinued and recurred one year later when tramadol was given while she was still taking citalopram. Serotonin syndrome was reported in a 43 year old man taking venlafaxine after increasing his tramadol dose from 300 to 400 mg/day (7). Pretreatment with paroxetine has been shown to increase the AUC of tramadol while inhibiting the formation of the active metabolite (8); however, the impact of these changes is unknown. Official package labeling for Brintellix (vortioxetine) (11) states that serotonin syndrome has been reported with serotonergic antidepressants (SSRIs, SNRIs, and others), including with vortioxetine, both when taken alone, but especially when coadministered with other serotonergic agents including tramadol.  References: 1. Leane Call et al: Lelon Frohlich Pharmacother 51:700(1749). 2. Mittino D et al: Clin Neuropharmacol 44:967(5916). 3. Ermelinda Das et al: [place] Soc Med 38:466(5993). 4. Gonzalez-Pinto A et al: Natasha Mead Psychiatry 570:177(9390). 5. Lange-Asschenfeldt C et  al: Joanna Hews Psychopharmacol 30:092(3300). 6. Mahlberg R et al: Natasha Mead Psychiatry [id number]). 7. [place] DJ: Ann Pharmacother 62:563(8937). 8. Dierdre Forth et al: Clin Pharmacol Ther 34:287(6811). 9. Official package labeling for Cymbalta (duloxetine). [place]; [place], [place] (2008). 10. Official package labeling for Savella (milnacipran). [place].; [place] (2009). 11.  Official package labeling for Brintellix (vortioxetine). [place].; [place], Glory [place] (2013).  DTMS Copyright 2015 Clinical Drug Information, [facility] Scs Version 15.3 Expires October 2015

## 2014-06-24 NOTE — Telephone Encounter (Signed)
Please advise refill? Last RX was done on 05-27-14 quantity 120 with 0 refills

## 2014-06-25 ENCOUNTER — Encounter: Payer: 59 | Attending: Endocrinology | Admitting: Nutrition

## 2014-06-25 DIAGNOSIS — E1065 Type 1 diabetes mellitus with hyperglycemia: Principal | ICD-10-CM

## 2014-06-25 DIAGNOSIS — E1049 Type 1 diabetes mellitus with other diabetic neurological complication: Secondary | ICD-10-CM

## 2014-06-25 NOTE — Patient Instructions (Signed)
Bolus for all snacks, unless it is to treat a low blood sugar. Call if blood sugar readings drop low or stay high

## 2014-06-25 NOTE — Telephone Encounter (Signed)
Melissa,  Can you advise in Dr Frederik Pear absence? It appears Rx was printed but I cannot determine if that was before or after alert was routed to Provider.

## 2014-06-25 NOTE — Telephone Encounter (Signed)
Rx called to [facility] at [facility], pt aware.

## 2014-06-25 NOTE — Addendum Note (Signed)
Addended by: Kelle Darting A on: 06/25/2014 02:45 PM   Modules accepted: Orders

## 2014-06-25 NOTE — Telephone Encounter (Signed)
Pt also states he was not longer taking hydrocodone. Med list updated.

## 2014-06-25 NOTE — Progress Notes (Signed)
Pt. Is wearing his sensor and pump and reports no difficulty using either one.  He is using the bolus wizard 90% of the time.   Problems identified: 1.  Not always bolusing for HS snack, despite the bolus wizard telling him to do this.  He says that he has seen his sensor go up, and knows now that after 3 times of not bolusing at this time, he needs to do what the pump says to do. 2.  FBSs higher due to not bolusing at HS No low blood sugars noted except for 1 time, which was due to over counting carbs.    Pt. Is very pleased with his pump and sensors, and says that his blood sugars are so much better.  Stressed the need to do what the pump tells him at [facility], and he agreed to do this.  Pt. To see Dr. Dwyane Dee in 4 weeks.  No changes made to settings at this time.  Will see if bolusing for HS snacks brings down higher FBSs and acS readings.

## 2014-06-25 NOTE — Telephone Encounter (Signed)
OK to provide rx.

## 2014-06-28 ENCOUNTER — Encounter (HOSPITAL_BASED_OUTPATIENT_CLINIC_OR_DEPARTMENT_OTHER): Payer: Self-pay | Admitting: Emergency Medicine

## 2014-06-28 ENCOUNTER — Emergency Department (HOSPITAL_BASED_OUTPATIENT_CLINIC_OR_DEPARTMENT_OTHER)
Admission: EM | Admit: 2014-06-28 | Discharge: 2014-06-28 | Disposition: A | Discharge status: Home / Self Care | Payer: 59 | Attending: Emergency Medicine | Admitting: Emergency Medicine

## 2014-06-28 DIAGNOSIS — K089 Disorder of teeth and supporting structures, unspecified: Secondary | ICD-10-CM | POA: Insufficient documentation

## 2014-06-28 DIAGNOSIS — Z8709 Personal history of other diseases of the respiratory system: Secondary | ICD-10-CM | POA: Insufficient documentation

## 2014-06-28 DIAGNOSIS — G43909 Migraine, unspecified, not intractable, without status migrainosus: Secondary | ICD-10-CM | POA: Insufficient documentation

## 2014-06-28 DIAGNOSIS — F3289 Other specified depressive episodes: Secondary | ICD-10-CM | POA: Insufficient documentation

## 2014-06-28 DIAGNOSIS — E119 Type 2 diabetes mellitus without complications: Secondary | ICD-10-CM | POA: Diagnosis not present

## 2014-06-28 DIAGNOSIS — E785 Hyperlipidemia, unspecified: Secondary | ICD-10-CM | POA: Insufficient documentation

## 2014-06-28 DIAGNOSIS — K219 Gastro-esophageal reflux disease without esophagitis: Secondary | ICD-10-CM | POA: Insufficient documentation

## 2014-06-28 DIAGNOSIS — I10 Essential (primary) hypertension: Secondary | ICD-10-CM | POA: Insufficient documentation

## 2014-06-28 DIAGNOSIS — H269 Unspecified cataract: Secondary | ICD-10-CM | POA: Diagnosis not present

## 2014-06-28 DIAGNOSIS — F17200 Nicotine dependence, unspecified, uncomplicated: Secondary | ICD-10-CM | POA: Diagnosis not present

## 2014-06-28 DIAGNOSIS — F329 Major depressive disorder, single episode, unspecified: Secondary | ICD-10-CM | POA: Diagnosis not present

## 2014-06-28 DIAGNOSIS — Z794 Long term (current) use of insulin: Secondary | ICD-10-CM | POA: Insufficient documentation

## 2014-06-28 DIAGNOSIS — Z79899 Other long term (current) drug therapy: Secondary | ICD-10-CM | POA: Insufficient documentation

## 2014-06-28 DIAGNOSIS — Z87828 Personal history of other (healed) physical injury and trauma: Secondary | ICD-10-CM | POA: Diagnosis not present

## 2014-06-28 DIAGNOSIS — K0889 Other specified disorders of teeth and supporting structures: Secondary | ICD-10-CM

## 2014-06-28 MED ORDER — PENICILLIN V POTASSIUM 500 MG PO TABS: 500.0000 mg | ORAL_TABLET | Freq: Four times a day (QID) | ORAL | Status: AC

## 2014-06-28 MED ORDER — HYDROCODONE-ACETAMINOPHEN 5-325 MG PO TABS
2.0000 | ORAL_TABLET | Freq: Once | ORAL | Status: AC
  Administered 2014-06-28: 2 via ORAL
  Filled 2014-06-28: qty 2

## 2014-06-28 MED ORDER — PENICILLIN V POTASSIUM 250 MG PO TABS
500.0000 mg | ORAL_TABLET | Freq: Four times a day (QID) | ORAL | Status: DC
  Administered 2014-06-28: 500 mg via ORAL
  Filled 2014-06-28: qty 2

## 2014-06-28 MED ORDER — HYDROCODONE-ACETAMINOPHEN 5-325 MG PO TABS: 1.0000 | ORAL_TABLET | Freq: Four times a day (QID) | ORAL | Status: DC | PRN

## 2014-06-28 NOTE — Discharge Instructions (Signed)
You have a dental injury. Use the resource guide listed below to help you find a dentist if you do not already have one to followup with. It is very important that you get evaluated by a dentist as soon as possible. Call tomorrow to schedule an appointment. Use your pain medication as prescribed and do not operate heavy machinery while on pain medication. Note that your pain medication contains acetaminophen (Tylenol) & its is not recommended that you use additional acetaminophen (Tylenol) while taking this medication. Take your full course of antibiotics. Read the instructions below. ° °Eat a soft or liquid diet and rinse your mouth out after meals with warm water. You should see a dentist or return here at once if you have increased swelling, increased pain or uncontrolled bleeding from the site of your injury. ° ° °SEEK MEDICAL CARE IF:  °· You have increased pain not controlled with medicines.  °· You have swelling around your tooth, in your face or neck.  °· You have bleeding which starts, continues, or gets worse.  °· You have a fever >101 °· If you are unable to open your mouth ° °RESOURCE GUIDE ° °Dental Problems ° °Patients with Medicaid: °[facility]                     [facility] °[address]                                           [address] °Phone:  [phone]                                                  Phone:  [phone] ° °If unable to pay or uninsured, contact:  [facility] or [facility] to become qualified for the adult dental clinic. ° °Chronic Pain Problems °Contact [facility]  [phone] °Patients need to be referred by their primary care doctor. ° °Insufficient Money for Medicine °Contact [employer]:  call "211" or [facility] 271-5999. ° °No Primary Care Doctor °Call [facility]  832-8000 °Other agencies that provide inexpensive medical care °   [facility]  [phone] °   [facility]  [phone] °   [facility]  [phone] °   [facility]  832-4777 °   [facility]  [phone] °   [facility]  [phone] ° °Psychological Services °[facility]  [phone] °[facility]  [phone] °[facility]   [phone] (emergency services [phone]) ° °Substance Abuse Resources °[facility]  336-882-2125 °[facility] 336-784-9470 °[facility] 336-285-9073 °[facility] 336-845-3988 °Residential & Outpatient Substance Abuse Program  800-659-3381 ° °Abuse/Neglect °[place] [facility] (336) 641-3795 °[place] [facility] Hotline 800-378-5315 (After Hours) ° °Emergency Shelter °[facility] (336) 271-5985 ° °Maternity Homes °[facility] (336) 275-9566 °[facility] (704) 372-4663 ° °MRSA Hotline #:   832-7006 ° ° ° °[place] Resources ° °[facility] [place]     [employer]                            [facility] [address] [place]                       [address]      [address]  [place]                                                [place]                            [place] Phone:  [phone]                                   Phone:  [phone]                 Phone:  [phone]  [facility] Phone:  [phone]  [place] Child Abuse Hotline (336) 342-1394 (336) 342-3537 (After Hours)    
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
## 2014-06-28 NOTE — ED Notes (Signed)
Pt with recurrent dental swelling and pain.

## 2014-07-01 NOTE — Progress Notes (Signed)
[name] is here today with his wife to start back on an insulin pump.  He was on and old [employer] pump several years ago.   We reviewed again the difference between basal and bolus insulin, and the need to get the basal rates set properly.  He agreed to eat lower in fat for me, and to test ac, 2hr pc, hs and 3AM.   He put in most of his settings himself with very little assistance from me.  He had previously put in date/time before coming in today.   Basal settings per Dr. Ronnie Derby note:  MN: 1.5,  6AM: 1.8, 1PM: 1.5.  Carb settings:  I/C: 4,  Sensitivity: 15, target 100-140, timing 4 hours.  We reviewed how to give a bolus, using the bolus wizard, and he reported good understanding of this.  We also reviewed topics of sick days, high blood sugar protocols, and when and how to use the temporary basal rates.  He reported good understanding of this and was given handouts for sick day guidelines and high blood sugar protocols.    He wanted started on the sensor as well today, so we reviewed the difference between the interstitial sugar reading and blood sugar reading and the need to always test blood sugars and use that reading before bolusing.  He reported good understanding of this.   His sensor was set to alarm with blood sugars below 90, and low predictive alert set 30 min. Before this. Low repeat was set for 45 min.  High blood sugar alarm was set for 275. The high predictive alert was set to off, and the rise rate was set to off as well.  High repeat was set at 2 hours.    We reviewed the need for calibrations--when and how to do this, and he reported good understanding of this.  Also we reviewed how to shut off the alarms.  He had no final quesitons.  He will call if questions develop.

## 2014-07-01 NOTE — Patient Instructions (Signed)
Test blood sugar before meals, 2hr. After meals, at bedtime and again at [facility] Call if blood sugars go over 250, or below 80.   Read over manual and call if questions.

## 2014-07-01 NOTE — ED Provider Notes (Signed)
Medical screening examination/treatment/procedure(s) were performed by non-physician practitioner and as supervising physician I was immediately available for consultation/collaboration.  Ernestina Patches, MD 07/01/14 [phone]

## 2014-07-07 ENCOUNTER — Other Ambulatory Visit: Payer: Self-pay | Admitting: Medical

## 2014-07-10 ENCOUNTER — Ambulatory Visit (INDEPENDENT_AMBULATORY_CARE_PROVIDER_SITE_OTHER): Payer: Self-pay | Admitting: Family Medicine

## 2014-07-10 VITALS — BP 131/97 | HR 99 | Wt 210.6 lb

## 2014-07-10 DIAGNOSIS — E1165 Type 2 diabetes mellitus with hyperglycemia: Secondary | ICD-10-CM

## 2014-07-10 DIAGNOSIS — E118 Type 2 diabetes mellitus with unspecified complications: Principal | ICD-10-CM

## 2014-07-10 DIAGNOSIS — IMO0002 Reserved for concepts with insufficient information to code with codable children: Secondary | ICD-10-CM

## 2014-07-10 NOTE — Progress Notes (Signed)
Patient presents today for DM follow-up as part of employer-sponsored Link to [employer]. Medications, glucose readings, a1c and compliance have been reviewed. I have also discussed with patient lifestyle interventions including diet and exercise. Details of the visit can be found in [employer] documenting program through [facility]). Patient has set a series of personal goals and will follow-up in no more than 3 months for further review of DM.  Since our last visit, [name] has been started on an insulin pump. He has shown great positivity in regards to his diabetic disease state since initiation. He continues to learn the intricacies of the pump and sensor and contacts me and a representative with [employer] as needed. His wild swings of blood glucoses have lessened but not fully resolved. He is due for a dilated eye exam. I have told him to contact Dr. Dwyane Dee to make sure a follow-up visit is scheduled (I don't see an appointment for 9/2 as patient reported). He is to establish a [employer] account with [employer] and improve exercise (as foot/leg pain allows) before our next visit.

## 2014-07-14 ENCOUNTER — Encounter: Payer: Self-pay | Admitting: Family Medicine

## 2014-07-14 ENCOUNTER — Ambulatory Visit (INDEPENDENT_AMBULATORY_CARE_PROVIDER_SITE_OTHER): Payer: 59 | Admitting: Family Medicine

## 2014-07-14 ENCOUNTER — Telehealth: Payer: Self-pay | Admitting: Family Medicine

## 2014-07-14 VITALS — BP 142/80 | HR 88 | Temp 97.9°F | Ht 70.0 in | Wt 208.0 lb

## 2014-07-14 DIAGNOSIS — IMO0001 Reserved for inherently not codable concepts without codable children: Secondary | ICD-10-CM

## 2014-07-14 DIAGNOSIS — E785 Hyperlipidemia, unspecified: Secondary | ICD-10-CM

## 2014-07-14 DIAGNOSIS — M25519 Pain in unspecified shoulder: Secondary | ICD-10-CM

## 2014-07-14 DIAGNOSIS — IMO0002 Reserved for concepts with insufficient information to code with codable children: Secondary | ICD-10-CM

## 2014-07-14 DIAGNOSIS — M25511 Pain in right shoulder: Secondary | ICD-10-CM

## 2014-07-14 DIAGNOSIS — Z23 Encounter for immunization: Secondary | ICD-10-CM

## 2014-07-14 DIAGNOSIS — I10 Essential (primary) hypertension: Secondary | ICD-10-CM

## 2014-07-14 DIAGNOSIS — E1165 Type 2 diabetes mellitus with hyperglycemia: Secondary | ICD-10-CM

## 2014-07-14 MED ORDER — HYDROCODONE-ACETAMINOPHEN 5-325 MG PO TABS: 1.0000 | ORAL_TABLET | Freq: Four times a day (QID) | ORAL | Status: DC | PRN
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
## 2014-07-14 NOTE — Patient Instructions (Signed)

## 2014-07-14 NOTE — Telephone Encounter (Signed)
Relevant patient education assigned to patient using Emmi. ° °

## 2014-07-14 NOTE — Progress Notes (Signed)
Pre visit review using our clinic review tool, if applicable. No additional management support is needed unless otherwise documented below in the visit note. 

## 2014-07-16 ENCOUNTER — Encounter: Payer: Self-pay | Admitting: Family Medicine

## 2014-07-16 DIAGNOSIS — M25519 Pain in unspecified shoulder: Secondary | ICD-10-CM | POA: Insufficient documentation

## 2014-07-16 NOTE — Assessment & Plan Note (Signed)
Right shoulder pain persistent, worse with movement. Is referred to sports med for further work up and allowed refill on Hydrocodone to use prn

## 2014-07-16 NOTE — Assessment & Plan Note (Signed)
Well controlled, no changes to meds. Encouraged heart healthy diet such as the DASH diet and exercise as tolerated.  °

## 2014-07-16 NOTE — Assessment & Plan Note (Signed)
Tolerating statin, encouraged heart healthy diet, avoid trans fats, minimize simple carbs and saturated fats. Increase exercise as tolerated 

## 2014-07-16 NOTE — Assessment & Plan Note (Signed)
A1C 8.1 patient notes numbers have been improving with new pump and feels well in this regard, will continue to follow with Dr Dwyane Dee

## 2014-07-22 ENCOUNTER — Ambulatory Visit: Payer: 59 | Admitting: Family Medicine

## 2014-07-23 ENCOUNTER — Other Ambulatory Visit: Payer: Self-pay | Admitting: Family Medicine

## 2014-07-25 ENCOUNTER — Telehealth: Payer: Self-pay | Admitting: Family Medicine

## 2014-07-25 MED ORDER — TRAMADOL HCL 50 MG PO TABS: 50.0000 mg | ORAL_TABLET | Freq: Four times a day (QID) | ORAL | Status: DC | PRN

## 2014-07-25 NOTE — Telephone Encounter (Signed)
Caller name: Berna Spare  Relation to pt: other  Pharmacy: [place] patient [phone]  Reason for call:   Pt requesting refill traMADol (ULTRAM) 50 MG tablet

## 2014-08-07 ENCOUNTER — Encounter: Payer: Self-pay | Admitting: Family Medicine

## 2014-08-07 NOTE — Progress Notes (Signed)
Patient ID: [name], male   DOB: 11-Nov-1971, 43 y.o.   MRN: [id number] Reviewed: Agree with the documentation and management of our [facility] pharmacologist.

## 2014-08-14 ENCOUNTER — Telehealth: Payer: Self-pay | Admitting: Family Medicine

## 2014-08-14 DIAGNOSIS — M25511 Pain in right shoulder: Secondary | ICD-10-CM

## 2014-08-14 MED ORDER — HYDROCODONE-ACETAMINOPHEN 5-325 MG PO TABS: 1.0000 | ORAL_TABLET | Freq: Four times a day (QID) | ORAL | Status: DC | PRN

## 2014-08-14 NOTE — Telephone Encounter (Signed)
Caller name: Jefrey Relation to pt: self Call back number: [phone] Pharmacy:  Reason for call:    Patient is requesting a new hydrocodone rx

## 2014-08-14 NOTE — Telephone Encounter (Signed)
Last RX done on 07-14-14 Quantity 100 with 0 refills   Please advise pt that his rx is ready to be picked up  I tried to call twice but it won't let me out

## 2014-08-15 NOTE — Telephone Encounter (Signed)
Left detailed message informing patient of this. °

## 2014-08-20 ENCOUNTER — Ambulatory Visit: Payer: 59 | Admitting: Family Medicine

## 2014-08-22 ENCOUNTER — Other Ambulatory Visit: Payer: Self-pay | Admitting: Family Medicine

## 2014-08-22 NOTE — Telephone Encounter (Signed)
eScribe request from [facility] for refill on Diazepam Last filled - 06.04.15, #[id number] Last AEX - 08.24.15 Next AEX - 3 Mths. Please Advise on refills/SLS

## 2014-08-27 ENCOUNTER — Ambulatory Visit: Payer: 59 | Admitting: Endocrinology

## 2014-08-31 ENCOUNTER — Other Ambulatory Visit: Payer: Self-pay | Admitting: Family Medicine

## 2014-08-31 DIAGNOSIS — M25511 Pain in right shoulder: Secondary | ICD-10-CM

## 2014-09-01 MED ORDER — HYDROCODONE-ACETAMINOPHEN 5-325 MG PO TABS: 1.0000 | ORAL_TABLET | Freq: Four times a day (QID) | ORAL | Status: DC | PRN

## 2014-09-01 MED ORDER — DULOXETINE HCL 60 MG PO CPEP: 60.0000 mg | ORAL_CAPSULE | Freq: Every day | ORAL | Status: DC

## 2014-09-01 NOTE — Telephone Encounter (Signed)
Last Hydrocodone refill done on 08-14-14 quantity 100 with 0 refills  rx printed for md to sign.  Pt notified via mychart

## 2014-09-02 ENCOUNTER — Encounter: Payer: Self-pay | Admitting: Internal Medicine

## 2014-09-10 ENCOUNTER — Other Ambulatory Visit: Payer: Self-pay | Admitting: Family Medicine

## 2014-09-11 ENCOUNTER — Encounter: Payer: Self-pay | Admitting: Endocrinology

## 2014-09-11 ENCOUNTER — Other Ambulatory Visit: Payer: Self-pay

## 2014-09-11 ENCOUNTER — Ambulatory Visit (INDEPENDENT_AMBULATORY_CARE_PROVIDER_SITE_OTHER): Payer: 59 | Admitting: Endocrinology

## 2014-09-11 VITALS — BP 118/86 | HR 92 | Temp 98.3°F | Resp 16 | Ht 70.0 in | Wt 208.2 lb

## 2014-09-11 DIAGNOSIS — E1049 Type 1 diabetes mellitus with other diabetic neurological complication: Secondary | ICD-10-CM

## 2014-09-11 DIAGNOSIS — I10 Essential (primary) hypertension: Secondary | ICD-10-CM

## 2014-09-11 DIAGNOSIS — E1041 Type 1 diabetes mellitus with diabetic mononeuropathy: Secondary | ICD-10-CM

## 2014-09-11 DIAGNOSIS — E1065 Type 1 diabetes mellitus with hyperglycemia: Principal | ICD-10-CM

## 2014-09-11 DIAGNOSIS — IMO0002 Reserved for concepts with insufficient information to code with codable children: Secondary | ICD-10-CM

## 2014-09-11 LAB — BASIC METABOLIC PANEL
BUN: 9 mg/dL (ref 6–23)
CHLORIDE: 103 meq/L (ref 96–112)
CO2: 21 mEq/L (ref 19–32)
CREATININE: 1 mg/dL (ref 0.4–1.5)
Calcium: 9.4 mg/dL (ref 8.4–10.5)
GFR: 89.45 mL/min (ref >60.00)
Glucose, Bld: 178 mg/dL — ABNORMAL HIGH (ref 70–99)
POTASSIUM: 4 meq/L (ref 3.5–5.1)
Sodium: 137 mEq/L (ref 135–145)

## 2014-09-11 LAB — MICROALBUMIN / CREATININE URINE RATIO
Creatinine,U: 288.9 mg/dL
MICROALB/CREAT RATIO: 0.7 mg/g (ref 0.0–30.0)
Microalb, Ur: 1.9 mg/dL (ref 0.0–1.9)

## 2014-09-11 LAB — HEMOGLOBIN A1C: HEMOGLOBIN A1C: 7.9 % — AB (ref 4.6–6.5)

## 2014-09-11 MED ORDER — TRAMADOL HCL 50 MG PO TABS: 50.0000 mg | ORAL_TABLET | Freq: Four times a day (QID) | ORAL | Status: DC | PRN

## 2014-09-11 NOTE — Progress Notes (Signed)
Quick Note:  Please let patient know that the A1c result is 7.9, slightly better kidney test okay ______

## 2014-09-11 NOTE — Progress Notes (Signed)
RX printed for md to sign and fax  Last rx was done on 07-25-14 quantity 120 with 0 refills

## 2014-09-11 NOTE — Patient Instructions (Addendum)
Leave off aspirin for a month; may possibly use 81 mg, 3/7 days later   Temp basal for exercise as discussed Increase basal rates as discussed and may need to increase further if needed
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
## 2014-09-12 DIAGNOSIS — E1049 Type 1 diabetes mellitus with other diabetic neurological complication: Secondary | ICD-10-CM | POA: Insufficient documentation

## 2014-09-12 DIAGNOSIS — I10 Essential (primary) hypertension: Secondary | ICD-10-CM | POA: Insufficient documentation

## 2014-09-12 DIAGNOSIS — IMO0002 Reserved for concepts with insufficient information to code with codable children: Secondary | ICD-10-CM | POA: Insufficient documentation

## 2014-09-12 DIAGNOSIS — E1065 Type 1 diabetes mellitus with hyperglycemia: Principal | ICD-10-CM

## 2014-09-15 ENCOUNTER — Encounter: Payer: Self-pay | Admitting: Family Medicine

## 2014-09-15 ENCOUNTER — Other Ambulatory Visit: Payer: Self-pay | Admitting: Family Medicine

## 2014-09-15 DIAGNOSIS — M25511 Pain in right shoulder: Secondary | ICD-10-CM

## 2014-09-15 NOTE — Telephone Encounter (Signed)
Please advise pt of mds message

## 2014-09-15 NOTE — Telephone Encounter (Signed)
please advise refill?  Last RX was done on 09-01-14 quantity 100 with 0 refills

## 2014-09-15 NOTE — Telephone Encounter (Signed)
Not due yet. If worsening pain needs to come in to discuss

## 2014-09-17 IMAGING — US US EXTREM LOW VENOUS*R*
1 series · 14 of 21 positions shown · non-contrast
Comparison: None.

CLINICAL DATA: Right leg swelling

RIGHT LOWER EXTREMITY VENOUS DUPLEX ULTRASOUND
TECHNIQUE: Gray-scale sonography with graded compression, as well
as color Doppler and duplex ultrasound, were performed to evaluate
the deep venous system of the lower extremity from the level of the
common femoral vein through the popliteal and proximal calf veins.
Spectral Doppler was utilized to evaluate flow at rest and with
distal augmentation maneuvers.

[Series 1: us extrem low venous*right* · 14 of 21 slices shown]
[im 1/21]
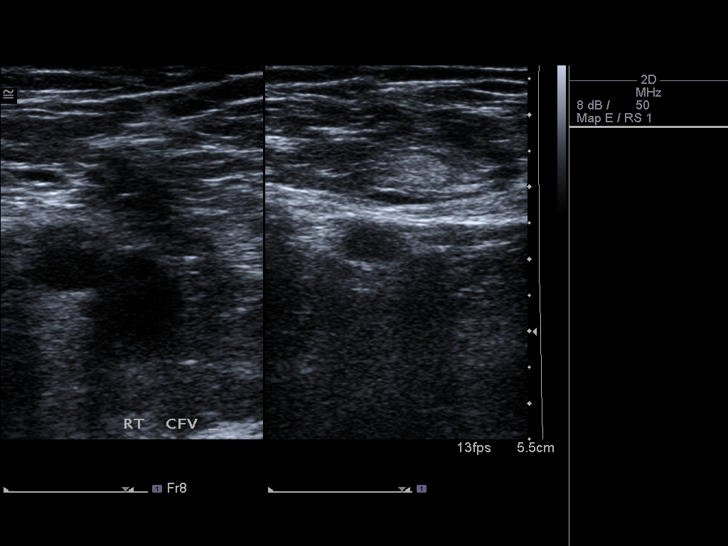
[im 3/21]
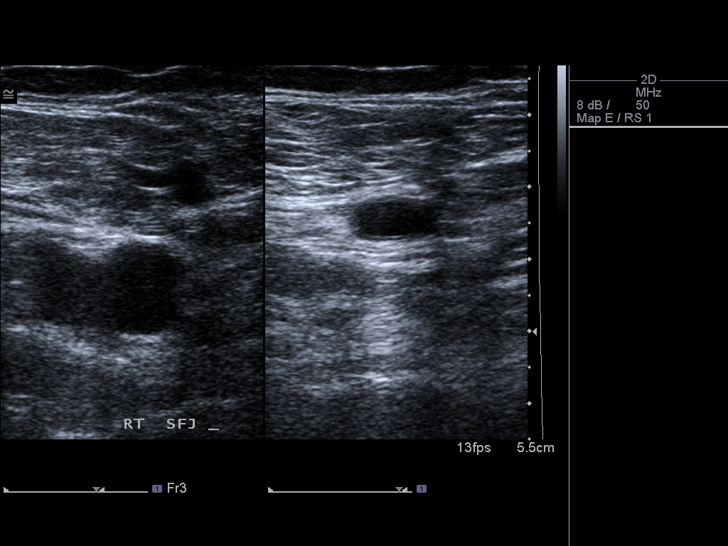
[im 4/21]
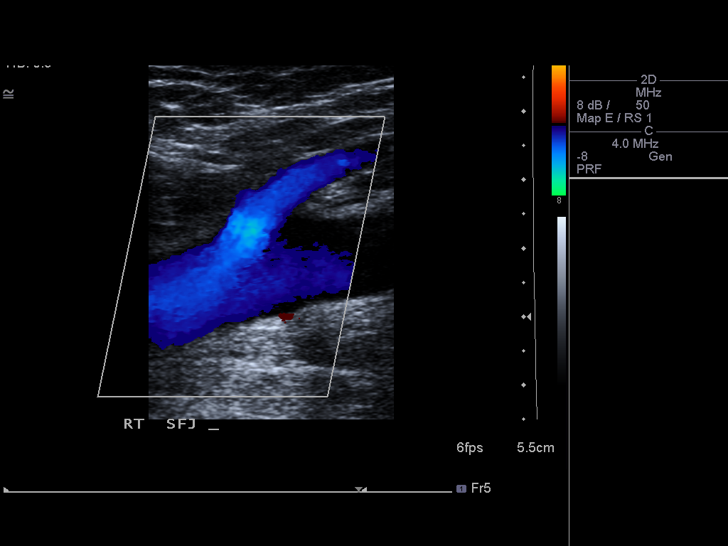
[im 6/21]
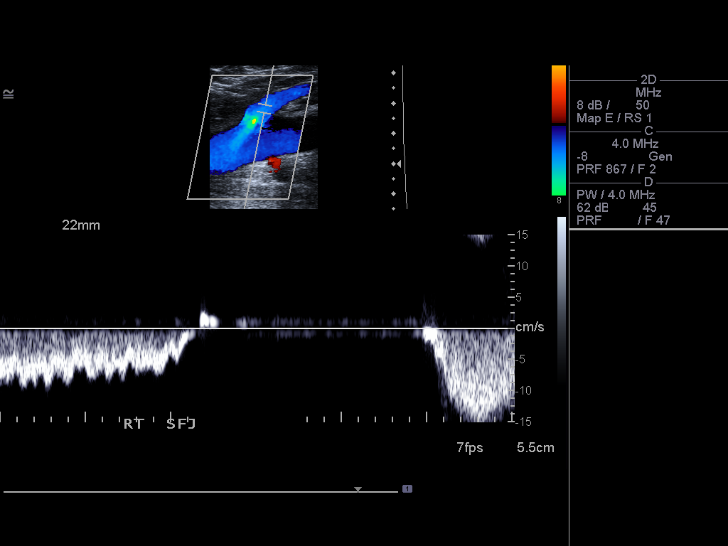
[im 7/21]
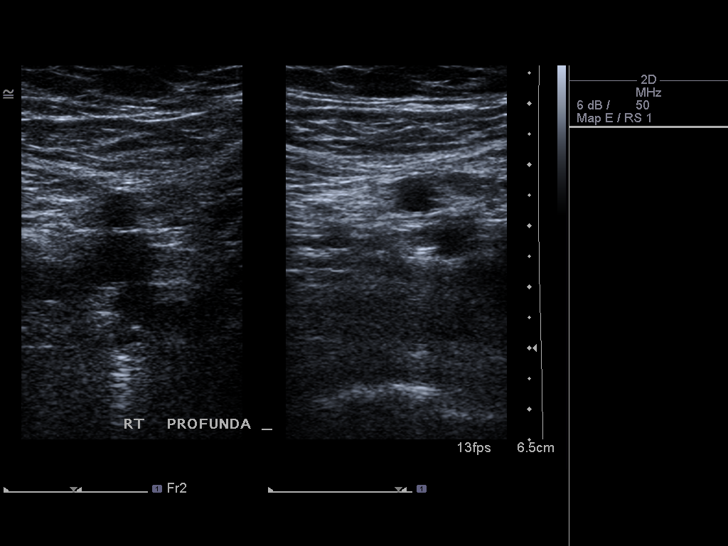
[im 9/21]
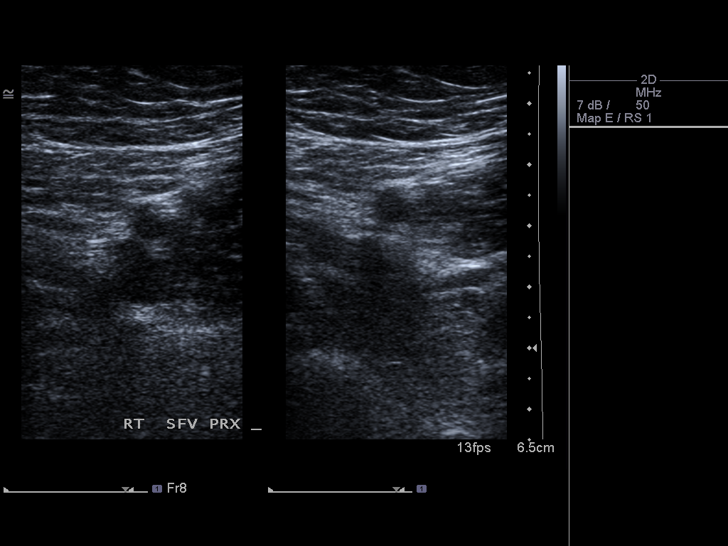
[im 10/21]
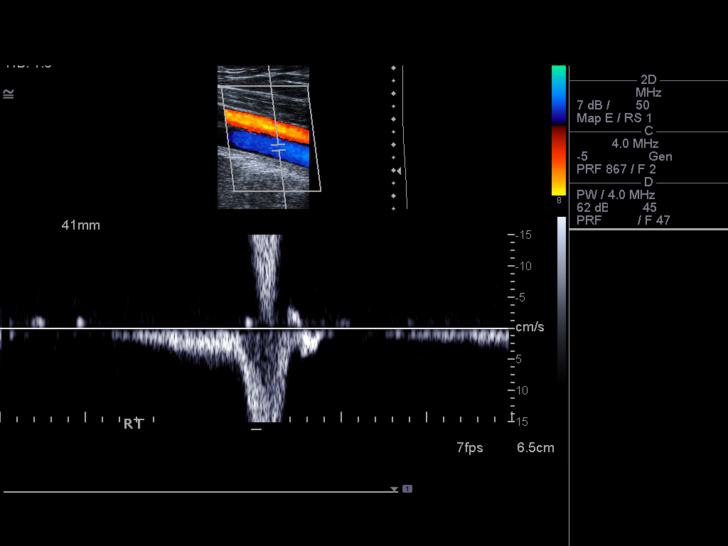
[im 12/21]
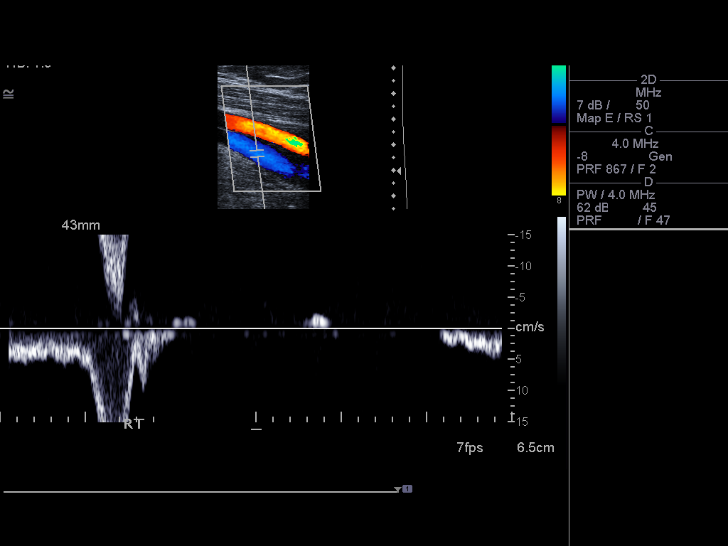
[im 13/21]
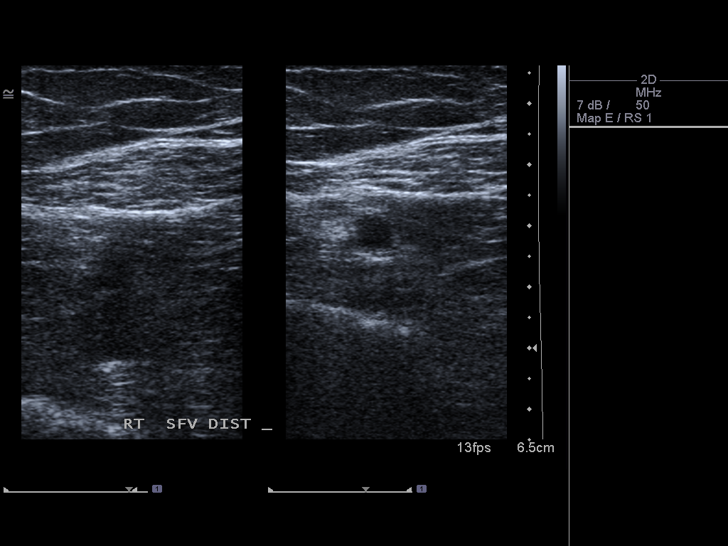
[im 15/21]
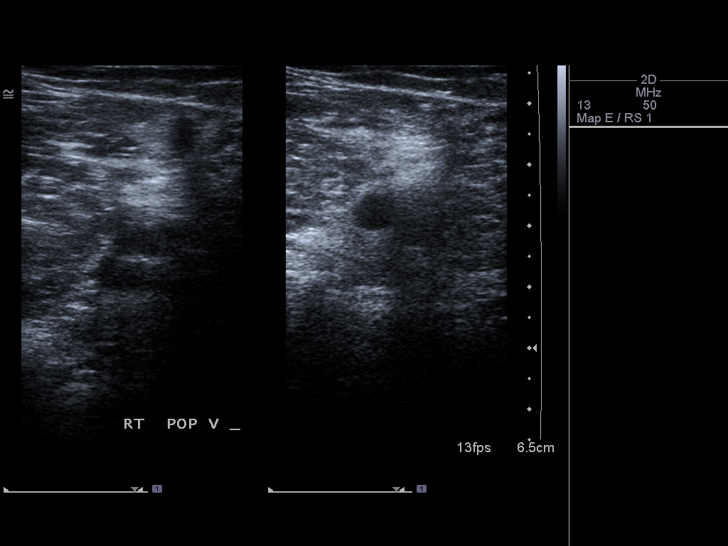
[im 16/21]
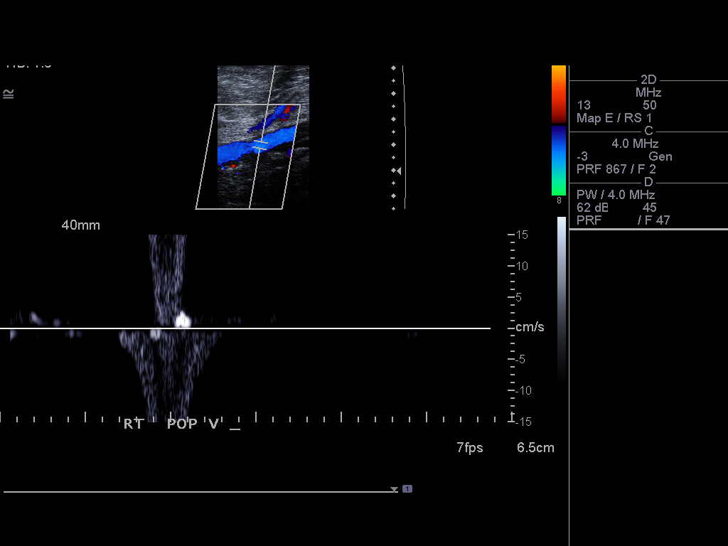
[im 18/21]
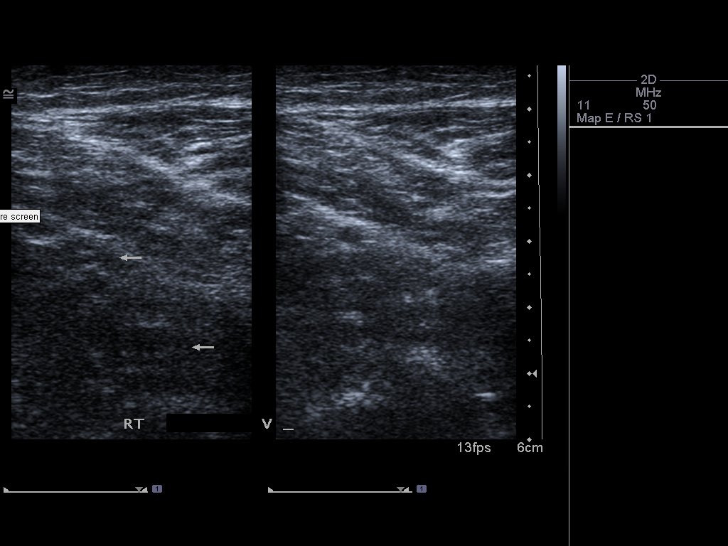
[im 19/21]
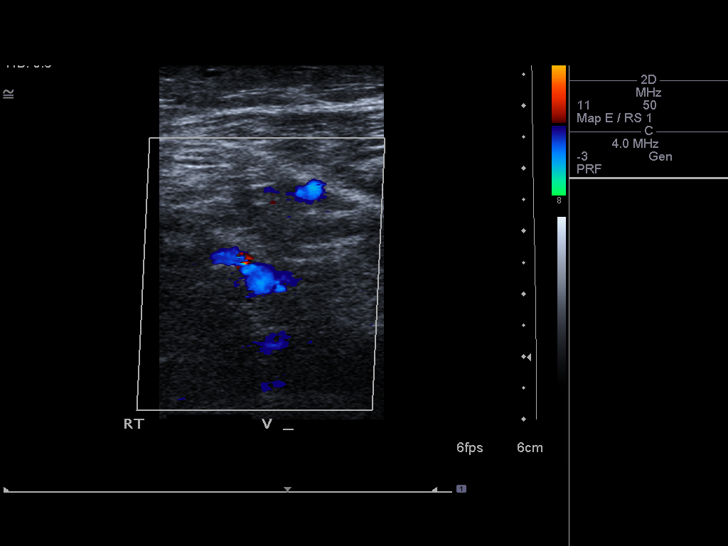
[im 21/21]
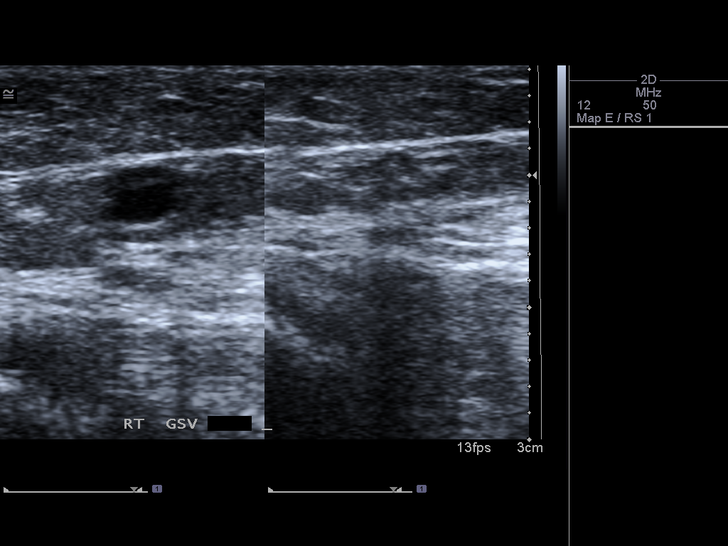

[14 of 21 positions shown; findings below may reference images not displayed]

FINDINGS: There is complete compressibility of the right common
femoral, femoral, and popliteal veins.  Doppler analysis
demonstrates respiratory phasicity and augmentation of flow upon
calf compression.
IMPRESSION: No evidence of right lower extremity DVT.

## 2014-09-18 ENCOUNTER — Telehealth: Payer: Self-pay | Admitting: Family Medicine

## 2014-09-18 ENCOUNTER — Other Ambulatory Visit: Payer: Self-pay | Admitting: Endocrinology

## 2014-09-18 NOTE — Telephone Encounter (Signed)
Caller name: Jovanny  Call back number:[phone] Pharmacy:  Reason for call:  Would like to speak with Alyse Low regarding this mychart message

## 2014-09-18 NOTE — Telephone Encounter (Signed)
A user error has taken place.

## 2014-09-19 ENCOUNTER — Telehealth: Payer: Self-pay | Admitting: Family Medicine

## 2014-09-19 NOTE — Telephone Encounter (Signed)
Patient informed and informed to ask the pharmacy for Coricidin HBP with no sugar

## 2014-09-19 NOTE — Telephone Encounter (Signed)
Please advise 

## 2014-09-19 NOTE — Telephone Encounter (Signed)
Have him use Coricin HBP for cough, Mucinex 1 tab twice daily and Encouraged increased rest and hydration, add probiotics such as Digestive Advantage, zinc such as Coldeze or Xicam. Treat fevers as needed

## 2014-09-19 NOTE — Telephone Encounter (Signed)
Caller name:  [place], Rosiak Relation to pt: self  Call back number: [phone]   Reason for call:   chest congestion coughing no fever pt has diabetes and blood sugar and skeptical of what medication to take and in need of clinical advice he states certain medication messes with his BS and BP and has no transportation to come in for a visit. Please advise

## 2014-09-20 ENCOUNTER — Emergency Department (HOSPITAL_BASED_OUTPATIENT_CLINIC_OR_DEPARTMENT_OTHER)
Admission: EM | Admit: 2014-09-20 | Discharge: 2014-09-20 | Disposition: A | Discharge status: Home / Self Care | Payer: 59 | Attending: Emergency Medicine | Admitting: Emergency Medicine

## 2014-09-20 ENCOUNTER — Encounter (HOSPITAL_BASED_OUTPATIENT_CLINIC_OR_DEPARTMENT_OTHER): Payer: Self-pay | Admitting: Emergency Medicine

## 2014-09-20 ENCOUNTER — Emergency Department (HOSPITAL_BASED_OUTPATIENT_CLINIC_OR_DEPARTMENT_OTHER): Payer: 59

## 2014-09-20 DIAGNOSIS — R52 Pain, unspecified: Secondary | ICD-10-CM

## 2014-09-20 DIAGNOSIS — Z794 Long term (current) use of insulin: Secondary | ICD-10-CM | POA: Insufficient documentation

## 2014-09-20 DIAGNOSIS — G43909 Migraine, unspecified, not intractable, without status migrainosus: Secondary | ICD-10-CM | POA: Insufficient documentation

## 2014-09-20 DIAGNOSIS — F329 Major depressive disorder, single episode, unspecified: Secondary | ICD-10-CM | POA: Diagnosis not present

## 2014-09-20 DIAGNOSIS — E119 Type 2 diabetes mellitus without complications: Secondary | ICD-10-CM | POA: Insufficient documentation

## 2014-09-20 DIAGNOSIS — S93401A Sprain of unspecified ligament of right ankle, initial encounter: Secondary | ICD-10-CM

## 2014-09-20 DIAGNOSIS — Y9289 Other specified places as the place of occurrence of the external cause: Secondary | ICD-10-CM | POA: Insufficient documentation

## 2014-09-20 DIAGNOSIS — S93409A Sprain of unspecified ligament of unspecified ankle, initial encounter: Secondary | ICD-10-CM | POA: Insufficient documentation

## 2014-09-20 DIAGNOSIS — Y9389 Activity, other specified: Secondary | ICD-10-CM | POA: Insufficient documentation

## 2014-09-20 DIAGNOSIS — S46919A Strain of unspecified muscle, fascia and tendon at shoulder and upper arm level, unspecified arm, initial encounter: Secondary | ICD-10-CM | POA: Insufficient documentation

## 2014-09-20 DIAGNOSIS — I10 Essential (primary) hypertension: Secondary | ICD-10-CM | POA: Diagnosis not present

## 2014-09-20 DIAGNOSIS — K219 Gastro-esophageal reflux disease without esophagitis: Secondary | ICD-10-CM | POA: Diagnosis not present

## 2014-09-20 DIAGNOSIS — Z720 Tobacco use: Secondary | ICD-10-CM | POA: Insufficient documentation

## 2014-09-20 DIAGNOSIS — E785 Hyperlipidemia, unspecified: Secondary | ICD-10-CM | POA: Insufficient documentation

## 2014-09-20 DIAGNOSIS — W109XXA Fall (on) (from) unspecified stairs and steps, initial encounter: Secondary | ICD-10-CM | POA: Diagnosis not present

## 2014-09-20 DIAGNOSIS — S4991XA Unspecified injury of right shoulder and upper arm, initial encounter: Secondary | ICD-10-CM | POA: Diagnosis present

## 2014-09-20 DIAGNOSIS — Z79899 Other long term (current) drug therapy: Secondary | ICD-10-CM | POA: Diagnosis not present

## 2014-09-20 DIAGNOSIS — G2581 Restless legs syndrome: Secondary | ICD-10-CM | POA: Diagnosis not present

## 2014-09-20 DIAGNOSIS — S46911A Strain of unspecified muscle, fascia and tendon at shoulder and upper arm level, right arm, initial encounter: Secondary | ICD-10-CM

## 2014-09-20 DIAGNOSIS — Z8781 Personal history of (healed) traumatic fracture: Secondary | ICD-10-CM | POA: Insufficient documentation

## 2014-09-20 MED ORDER — HYDROCODONE-ACETAMINOPHEN 5-325 MG PO TABS: 1.0000 | ORAL_TABLET | ORAL | Status: DC | PRN

## 2014-09-20 NOTE — ED Notes (Signed)
Patient transported to X-ray 

## 2014-09-20 NOTE — Discharge Instructions (Signed)
Ankle Sprain °An ankle sprain is an injury to the strong, fibrous tissues (ligaments) that hold the bones of your ankle joint together.  °CAUSES °An ankle sprain is usually caused by a fall or by twisting your ankle. Ankle sprains most commonly occur when you step on the outer edge of your foot, and your ankle turns inward. People who participate in sports are more prone to these types of injuries.  °SYMPTOMS  °· Pain in your ankle. The pain may be present at rest or only when you are trying to stand or walk. °· Swelling. °· Bruising. Bruising may develop immediately or within 1 to 2 days after your injury. °· Difficulty standing or walking, particularly when turning corners or changing directions. °DIAGNOSIS  °Your caregiver will ask you details about your injury and perform a physical exam of your ankle to determine if you have an ankle sprain. During the physical exam, your caregiver will press on and apply pressure to specific areas of your foot and ankle. Your caregiver will try to move your ankle in certain ways. An X-ray exam may be done to be sure a bone was not broken or a ligament did not separate from one of the bones in your ankle (avulsion fracture).  °TREATMENT  °Certain types of braces can help stabilize your ankle. Your caregiver can make a recommendation for this. Your caregiver may recommend the use of medicine for pain. If your sprain is severe, your caregiver may refer you to a surgeon who helps to restore function to parts of your skeletal system (orthopedist) or a physical therapist. °HOME CARE INSTRUCTIONS  °· Apply ice to your injury for 1-2 days or as directed by your caregiver. Applying ice helps to reduce inflammation and pain. °¨ Put ice in a plastic bag. °¨ Place a towel between your skin and the bag. °¨ Leave the ice on for 15-20 minutes at a time, every 2 hours while you are awake. °· Only take over-the-counter or prescription medicines for pain, discomfort, or fever as directed by  your caregiver. °· Elevate your injured ankle above the level of your heart as much as possible for 2-3 days. °· If your caregiver recommends crutches, use them as instructed. Gradually put weight on the affected ankle. Continue to use crutches or a cane until you can walk without feeling pain in your ankle. °· If you have a plaster splint, wear the splint as directed by your caregiver. Do not rest it on anything harder than a pillow for the first 24 hours. Do not put weight on it. Do not get it wet. You may take it off to take a shower or bath. °· You may have been given an elastic bandage to wear around your ankle to provide support. If the elastic bandage is too tight (you have numbness or tingling in your foot or your foot becomes cold and blue), adjust the bandage to make it comfortable. °· If you have an air splint, you may blow more air into it or let air out to make it more comfortable. You may take your splint off at night and before taking a shower or bath. Wiggle your toes in the splint several times per day to decrease swelling. °SEEK MEDICAL CARE IF:  °· You have rapidly increasing bruising or swelling. °· Your toes feel extremely cold or you lose feeling in your foot. °· Your pain is not relieved with medicine. °SEEK IMMEDIATE MEDICAL CARE IF: °· Your toes are numb or blue. °·   You have severe pain that is increasing. MAKE SURE YOU:   Understand these instructions.  Will watch your condition.  Will get help right away if you are not doing well or get worse. Document Released: 11/07/2005 Document Revised: 08/01/2012 Document Reviewed: 11/19/2011 [facility] Patient Information 2015 [place], [place]. This information is not intended to replace advice given to you by your health care provider. Make sure you discuss any questions you have with your health care provider.  Muscle Strain A muscle strain is an injury that occurs when a muscle is stretched beyond its normal length. Usually a small number  of muscle fibers are torn when this happens. Muscle strain is rated in degrees. First-degree strains have the least amount of muscle fiber tearing and pain. Second-degree and third-degree strains have increasingly more tearing and pain.  Usually, recovery from muscle strain takes 1-2 weeks. Complete healing takes 5-6 weeks.  CAUSES  Muscle strain happens when a sudden, violent force placed on a muscle stretches it too far. This may occur with lifting, sports, or a fall.  RISK FACTORS Muscle strain is especially common in athletes.  SIGNS AND SYMPTOMS At the site of the muscle strain, there may be:  Pain.  Bruising.  Swelling.  Difficulty using the muscle due to pain or lack of normal function. DIAGNOSIS  Your health care provider will perform a physical exam and ask about your medical history. TREATMENT  Often, the best treatment for a muscle strain is resting, icing, and applying cold compresses to the injured area.  HOME CARE INSTRUCTIONS   Use the PRICE method of treatment to promote muscle healing during the first 2-3 days after your injury. The PRICE method involves:  Protecting the muscle from being injured again.  Restricting your activity and resting the injured body part.  Icing your injury. To do this, put ice in a plastic bag. Place a towel between your skin and the bag. Then, apply the ice and leave it on from 15-20 minutes each hour. After the third day, switch to moist heat packs.  Apply compression to the injured area with a splint or elastic bandage. Be careful not to wrap it too tightly. This may interfere with blood circulation or increase swelling.  Elevate the injured body part above the level of your heart as often as you can.  Only take over-the-counter or prescription medicines for pain, discomfort, or fever as directed by your health care provider.  Warming up prior to exercise helps to prevent future muscle strains. SEEK MEDICAL CARE IF:   You have  increasing pain or swelling in the injured area.  You have numbness, tingling, or a significant loss of strength in the injured area. MAKE SURE YOU:   Understand these instructions.  Will watch your condition.  Will get help right away if you are not doing well or get worse. Document Released: 11/07/2005 Document Revised: 08/28/2013 Document Reviewed: 06/06/2013 [facility] Patient Information 2015 [place], [place]. This information is not intended to replace advice given to you by your health care provider. Make sure you discuss any questions you have with your health care provider.

## 2014-09-20 NOTE — ED Notes (Signed)
Patient states that he fell off his steps earlier today and has pain to his right ankle and right shoulder, No deformity is noted at this time
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
## 2014-09-30 ENCOUNTER — Other Ambulatory Visit: Payer: Self-pay | Admitting: Family Medicine

## 2014-09-30 DIAGNOSIS — M25511 Pain in right shoulder: Secondary | ICD-10-CM

## 2014-09-30 NOTE — Telephone Encounter (Signed)
Please advise refill? Last RX was wrote 09-01-14 quantity 100 with 0 refills  There was also an rx wrote on 09-20-14 for quantity 20 with 0 refills

## 2014-10-01 ENCOUNTER — Other Ambulatory Visit: Payer: Self-pay | Admitting: Family Medicine

## 2014-10-01 DIAGNOSIS — M25511 Pain in right shoulder: Secondary | ICD-10-CM

## 2014-10-02 MED ORDER — HYDROCODONE-ACETAMINOPHEN 5-325 MG PO TABS: 1.0000 | ORAL_TABLET | Freq: Four times a day (QID) | ORAL | Status: DC | PRN

## 2014-10-13 ENCOUNTER — Other Ambulatory Visit: Payer: Self-pay | Admitting: Family Medicine

## 2014-10-13 MED ORDER — TRAMADOL HCL 50 MG PO TABS
50.0000 mg | ORAL_TABLET | Freq: Four times a day (QID) | ORAL | Status: DC | PRN
Start: 2014-10-13 — End: 2014-11-11

## 2014-10-25 ENCOUNTER — Emergency Department (HOSPITAL_BASED_OUTPATIENT_CLINIC_OR_DEPARTMENT_OTHER)
Admission: EM | Admit: 2014-10-25 | Discharge: 2014-10-25 | Disposition: A | Discharge status: Home / Self Care | Payer: 59 | Attending: Emergency Medicine | Admitting: Emergency Medicine

## 2014-10-25 ENCOUNTER — Emergency Department (HOSPITAL_BASED_OUTPATIENT_CLINIC_OR_DEPARTMENT_OTHER): Payer: 59

## 2014-10-25 ENCOUNTER — Encounter (HOSPITAL_BASED_OUTPATIENT_CLINIC_OR_DEPARTMENT_OTHER): Payer: Self-pay | Admitting: *Deleted

## 2014-10-25 DIAGNOSIS — Y9289 Other specified places as the place of occurrence of the external cause: Secondary | ICD-10-CM | POA: Insufficient documentation

## 2014-10-25 DIAGNOSIS — Y93E8 Activity, other personal hygiene: Secondary | ICD-10-CM | POA: Diagnosis not present

## 2014-10-25 DIAGNOSIS — E785 Hyperlipidemia, unspecified: Secondary | ICD-10-CM | POA: Diagnosis not present

## 2014-10-25 DIAGNOSIS — R200 Anesthesia of skin: Secondary | ICD-10-CM | POA: Diagnosis not present

## 2014-10-25 DIAGNOSIS — S4991XA Unspecified injury of right shoulder and upper arm, initial encounter: Secondary | ICD-10-CM | POA: Diagnosis not present

## 2014-10-25 DIAGNOSIS — X58XXXA Exposure to other specified factors, initial encounter: Secondary | ICD-10-CM | POA: Diagnosis not present

## 2014-10-25 DIAGNOSIS — G2581 Restless legs syndrome: Secondary | ICD-10-CM | POA: Diagnosis not present

## 2014-10-25 DIAGNOSIS — Z720 Tobacco use: Secondary | ICD-10-CM | POA: Diagnosis not present

## 2014-10-25 DIAGNOSIS — Z7982 Long term (current) use of aspirin: Secondary | ICD-10-CM | POA: Diagnosis not present

## 2014-10-25 DIAGNOSIS — K219 Gastro-esophageal reflux disease without esophagitis: Secondary | ICD-10-CM | POA: Insufficient documentation

## 2014-10-25 DIAGNOSIS — F329 Major depressive disorder, single episode, unspecified: Secondary | ICD-10-CM | POA: Diagnosis not present

## 2014-10-25 DIAGNOSIS — E119 Type 2 diabetes mellitus without complications: Secondary | ICD-10-CM | POA: Diagnosis not present

## 2014-10-25 DIAGNOSIS — I10 Essential (primary) hypertension: Secondary | ICD-10-CM | POA: Insufficient documentation

## 2014-10-25 DIAGNOSIS — M25511 Pain in right shoulder: Secondary | ICD-10-CM

## 2014-10-25 DIAGNOSIS — Z79899 Other long term (current) drug therapy: Secondary | ICD-10-CM | POA: Diagnosis not present

## 2014-10-25 DIAGNOSIS — G43909 Migraine, unspecified, not intractable, without status migrainosus: Secondary | ICD-10-CM | POA: Diagnosis not present

## 2014-10-25 DIAGNOSIS — Y998 Other external cause status: Secondary | ICD-10-CM | POA: Diagnosis not present

## 2014-10-25 DIAGNOSIS — Z794 Long term (current) use of insulin: Secondary | ICD-10-CM | POA: Diagnosis not present

## 2014-10-25 MED ORDER — HYDROCODONE-ACETAMINOPHEN 5-325 MG PO TABS: 1.0000 | ORAL_TABLET | ORAL | Status: DC | PRN

## 2014-10-25 MED ORDER — HYDROCODONE-ACETAMINOPHEN 5-325 MG PO TABS
2.0000 | ORAL_TABLET | Freq: Once | ORAL | Status: AC
  Administered 2014-10-25: 2 via ORAL

## 2014-10-25 NOTE — Discharge Instructions (Signed)
Shoulder Pain The shoulder is the joint that connects your arms to your body. The bones that form the shoulder joint include the upper arm bone (humerus), the shoulder blade (scapula), and the collarbone (clavicle). The top of the humerus is shaped like a ball and fits into a rather flat socket on the scapula (glenoid cavity). A combination of muscles and strong, fibrous tissues that connect muscles to bones (tendons) support your shoulder joint and hold the ball in the socket. Small, fluid-filled sacs (bursae) are located in different areas of the joint. They act as cushions between the bones and the overlying soft tissues and help reduce friction between the gliding tendons and the bone as you move your arm. Your shoulder joint allows a wide range of motion in your arm. This range of motion allows you to do things like scratch your back or throw a ball. However, this range of motion also makes your shoulder more prone to pain from overuse and injury. Causes of shoulder pain can originate from both injury and overuse and usually can be grouped in the following four categories:  Redness, swelling, and pain (inflammation) of the tendon (tendinitis) or the bursae (bursitis).  Instability, such as a dislocation of the joint.  Inflammation of the joint (arthritis).  Broken bone (fracture). HOME CARE INSTRUCTIONS   Apply ice to the sore area.  Put ice in a plastic bag.  Place a towel between your skin and the bag.  Leave the ice on for 15-20 minutes, 3-4 times per day for the first 2 days, or as directed by your health care provider.  Stop using cold packs if they do not help with the pain.  If you have a shoulder sling or immobilizer, wear it as long as your caregiver instructs. Only remove it to shower or bathe. Move your arm as little as possible, but keep your hand moving to prevent swelling.  Squeeze a soft ball or foam pad as much as possible to help prevent swelling.  Only take  over-the-counter or prescription medicines for pain, discomfort, or fever as directed by your caregiver. SEEK MEDICAL CARE IF:   Your shoulder pain increases, or new pain develops in your arm, hand, or fingers.  Your hand or fingers become cold and numb.  Your pain is not relieved with medicines. SEEK IMMEDIATE MEDICAL CARE IF:   Your arm, hand, or fingers are numb or tingling.  Your arm, hand, or fingers are significantly swollen or turn white or blue. MAKE SURE YOU:   Understand these instructions.  Will watch your condition.  Will get help right away if you are not doing well or get worse. Document Released: 08/17/2005 Document Revised: 03/24/2014 Document Reviewed: 10/22/2011 [facility] Patient Information 2015 [place], [place]. This information is not intended to replace advice given to you by your health care provider. Make sure you discuss any questions you have with your health care provider.    Carpal Tunnel Syndrome The carpal tunnel is a narrow area located on the palm side of your wrist. The tunnel is formed by the wrist bones and ligaments. Nerves, blood vessels, and tendons pass through the carpal tunnel. Repeated wrist motion or certain diseases may cause swelling within the tunnel. This swelling pinches the main nerve in the wrist (median nerve) and causes the painful hand and arm condition called carpal tunnel syndrome. CAUSES   Repeated wrist motions.  Wrist injuries.  Certain diseases like arthritis, diabetes, alcoholism, hyperthyroidism, and kidney failure.  Obesity.  Pregnancy.  SYMPTOMS   A "pins and needles" feeling in your fingers or hand, especially in your thumb, index and middle fingers.  Tingling or numbness in your fingers or hand.  An aching feeling in your entire arm, especially when your wrist and elbow are bent for long periods of time.  Wrist pain that goes up your arm to your shoulder.  Pain that goes down into your palm or fingers.  A  weak feeling in your hands. DIAGNOSIS  Your health care provider will take your history and perform a physical exam. An electromyography test may be needed. This test measures electrical signals sent out by your nerves into the muscles. The electrical signals are usually slowed by carpal tunnel syndrome. You may also need X-rays. TREATMENT  Carpal tunnel syndrome may clear up by itself. Your health care provider may recommend a wrist splint or medicine such as a nonsteroidal anti-inflammatory medicine. Cortisone injections may help. Sometimes, surgery may be needed to free the pinched nerve.  HOME CARE INSTRUCTIONS   Take all medicine as directed by your health care provider. Only take over-the-counter or prescription medicines for pain, discomfort, or fever as directed by your health care provider.  If you were given a splint to keep your wrist from bending, wear it as directed. It is important to wear the splint at night. Wear the splint for as long as you have pain or numbness in your hand, arm, or wrist. This may take 1 to 2 months.  Rest your wrist from any activity that may be causing your pain. If your symptoms are work-related, you may need to talk to your employer about changing to a job that does not require using your wrist.  Put ice on your wrist after long periods of wrist activity.  Put ice in a plastic bag.  Place a towel between your skin and the bag.  Leave the ice on for 15-20 minutes, 03-04 times a day.  Keep all follow-up visits as directed by your health care provider. This includes any orthopedic referrals, physical therapy, and rehabilitation. Any delay in getting necessary care could result in a delay or failure of your condition to heal. SEEK IMMEDIATE MEDICAL CARE IF:   You have new, unexplained symptoms.  Your symptoms get worse and are not helped or controlled with medicines. MAKE SURE YOU:   Understand these instructions.  Will watch your condition.  Will  get help right away if you are not doing well or get worse. Document Released: 11/04/2000 Document Revised: 03/24/2014 Document Reviewed: 09/23/2011 [facility] Patient Information 2015 [place], [place]. This information is not intended to replace advice given to you by your health care provider. Make sure you discuss any questions you have with your health care provider.
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
## 2014-10-25 NOTE — ED Notes (Signed)
Patient c/o R shoulder pain that has grown worse over the past two months, Hurts to raise arm, not aware of any injury

## 2014-10-31 ENCOUNTER — Telehealth: Payer: 59 | Admitting: Family

## 2014-10-31 DIAGNOSIS — J069 Acute upper respiratory infection, unspecified: Secondary | ICD-10-CM

## 2014-10-31 MED ORDER — BENZONATATE 100 MG PO CAPS: 100.0000 mg | ORAL_CAPSULE | Freq: Three times a day (TID) | ORAL | Status: DC | PRN

## 2014-10-31 MED ORDER — AZITHROMYCIN 250 MG PO TABS: ORAL_TABLET | ORAL | Status: DC

## 2014-10-31 NOTE — Progress Notes (Signed)
We are sorry that you are not feeling well.  Here is how we plan to help!  Based on what you have shared with me it looks like you have upper respiratory tract inflammation that has resulted in a signification cough.  Inflammation and infection in the upper respiratory tract is commonly called bronchitis and has four common causes:  Allergies, Viral Infections, Acid Reflux and Bacterial Infections.  Allergies, viruses and acid reflux are treated by controlling symptoms or eliminating the cause. An example might be a cough caused by taking certain blood pressure medications. You stop the cough by changing the medication. Another example might be a cough caused by acid reflux. Controlling the reflux helps control the cough.  Based on your presentation I believe you most likely have A cough due to bacteria.  When patients have a fever and a productive cough with a change in color or increased sputum production, we are concerned about bacterial bronchitis.  If left untreated it can progress to pneumonia.  If your symptoms do not improve with your treatment plan it is important that you contact your provider.   I have prescribed Azithromyin 250 mg: two tables now and then one tablet daily for 4 additonal days   In addition you may use A prescription cough medication called Tessalon Perles 100mg. You may take 1-2 capsules every 8 hours as needed for your cough.    HOME CARE . Only take medications as instructed by your medical team. . Complete the entire course of an antibiotic. . Drink plenty of fluids and get plenty of rest. . Avoid close contacts especially the very young and the elderly . Cover your mouth if you cough or cough into your sleeve. . Always remember to wash your hands . A steam or ultrasonic humidifier can help congestion.    GET HELP RIGHT AWAY IF: . You develop worsening fever. . You become short of breath . You cough up blood. . Your symptoms persist after you have completed your  treatment plan MAKE SURE YOU   Understand these instructions.  Will watch your condition.  Will get help right away if you are not doing well or get worse.  Your e-visit answers were reviewed by a board certified advanced clinical practitioner to complete your personal care plan.  Depending on the condition, your plan could have included both over the counter or prescription medications.  Please review your pharmacy choice.  If there is a problem, you may call our nursing hot line at [phone] and have the prescription routed to another pharmacy.  Your safety is important to us.  If you have drug allergies check your prescription carefully.    You can use MyChart to ask questions about today's visit, request a non-urgent call back, or ask for a work or school excuse.  You will get an e-mail in the next two days asking about your experience.  I hope that your e-visit has been valuable and will speed your recovery. Thank you for using e-visits.   

## 2014-11-09 ENCOUNTER — Other Ambulatory Visit: Payer: Self-pay | Admitting: Family Medicine

## 2014-11-11 ENCOUNTER — Other Ambulatory Visit (HOSPITAL_COMMUNITY): Payer: Self-pay | Admitting: Orthopedic Surgery

## 2014-11-11 ENCOUNTER — Ambulatory Visit (INDEPENDENT_AMBULATORY_CARE_PROVIDER_SITE_OTHER): Payer: 59 | Admitting: Family Medicine

## 2014-11-11 ENCOUNTER — Encounter: Payer: Self-pay | Admitting: Family Medicine

## 2014-11-11 VITALS — BP 130/84 | HR 99 | Temp 97.9°F | Resp 16 | Ht 70.0 in | Wt 212.4 lb

## 2014-11-11 DIAGNOSIS — E1049 Type 1 diabetes mellitus with other diabetic neurological complication: Secondary | ICD-10-CM

## 2014-11-11 DIAGNOSIS — E1065 Type 1 diabetes mellitus with hyperglycemia: Secondary | ICD-10-CM

## 2014-11-11 DIAGNOSIS — E785 Hyperlipidemia, unspecified: Secondary | ICD-10-CM

## 2014-11-11 DIAGNOSIS — F32A Depression, unspecified: Secondary | ICD-10-CM

## 2014-11-11 DIAGNOSIS — E1041 Type 1 diabetes mellitus with diabetic mononeuropathy: Secondary | ICD-10-CM

## 2014-11-11 DIAGNOSIS — K219 Gastro-esophageal reflux disease without esophagitis: Secondary | ICD-10-CM

## 2014-11-11 DIAGNOSIS — I10 Essential (primary) hypertension: Secondary | ICD-10-CM

## 2014-11-11 DIAGNOSIS — G43809 Other migraine, not intractable, without status migrainosus: Secondary | ICD-10-CM

## 2014-11-11 DIAGNOSIS — IMO0002 Reserved for concepts with insufficient information to code with codable children: Secondary | ICD-10-CM

## 2014-11-11 DIAGNOSIS — F329 Major depressive disorder, single episode, unspecified: Secondary | ICD-10-CM

## 2014-11-11 MED ORDER — PANTOPRAZOLE SODIUM 40 MG PO TBEC: 40.0000 mg | DELAYED_RELEASE_TABLET | Freq: Every day | ORAL | Status: DC

## 2014-11-11 MED ORDER — TRAMADOL HCL 50 MG PO TABS: 50.0000 mg | ORAL_TABLET | Freq: Four times a day (QID) | ORAL | Status: DC | PRN

## 2014-11-11 NOTE — Patient Instructions (Signed)

## 2014-11-11 NOTE — Progress Notes (Signed)
Pre visit review using our clinic review tool, if applicable. No additional management support is needed unless otherwise documented below in the visit note/SLS  

## 2014-11-13 ENCOUNTER — Other Ambulatory Visit (HOSPITAL_COMMUNITY): Payer: Self-pay | Admitting: Orthopedic Surgery

## 2014-11-13 DIAGNOSIS — M25511 Pain in right shoulder: Secondary | ICD-10-CM

## 2014-11-13 MED ORDER — PANTOPRAZOLE SODIUM 40 MG PO TBEC: 40.0000 mg | DELAYED_RELEASE_TABLET | Freq: Every day | ORAL | Status: DC

## 2014-11-13 NOTE — Telephone Encounter (Signed)
Med denied, pt was given this in the office.

## 2014-11-16 ENCOUNTER — Encounter: Payer: Self-pay | Admitting: Family Medicine
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
## 2014-11-16 NOTE — Assessment & Plan Note (Signed)
Following with endocrinology and has a pump in place, sugars still running hi but improving. Minimize simple carbs.

## 2014-11-16 NOTE — Assessment & Plan Note (Signed)
Well controlled, no changes to meds. Encouraged heart healthy diet such as the DASH diet and exercise as tolerated.  °

## 2014-11-16 NOTE — Assessment & Plan Note (Signed)
Tolerating statin, encouraged heart healthy diet, avoid trans fats, minimize simple carbs and saturated fats. Increase exercise as tolerated 

## 2014-11-16 NOTE — Assessment & Plan Note (Signed)
Improved on Cymbalta. Continue the same

## 2014-11-16 NOTE — Assessment & Plan Note (Signed)
Encouraged increased hydration, 64 ounces of clear fluids daily. Minimize alcohol and caffeine. Eat small frequent meals with lean proteins and complex carbs. Avoid high and low blood sugars. Get adequate sleep, 7-8 hours a night. Needs exercise daily preferably in the morning.  

## 2014-11-17 ENCOUNTER — Encounter (HOSPITAL_BASED_OUTPATIENT_CLINIC_OR_DEPARTMENT_OTHER): Payer: Self-pay

## 2014-11-17 ENCOUNTER — Emergency Department (HOSPITAL_BASED_OUTPATIENT_CLINIC_OR_DEPARTMENT_OTHER): Payer: 59

## 2014-11-17 ENCOUNTER — Emergency Department (HOSPITAL_BASED_OUTPATIENT_CLINIC_OR_DEPARTMENT_OTHER)
Admission: EM | Admit: 2014-11-17 | Discharge: 2014-11-17 | Disposition: A | Discharge status: Home / Self Care | Payer: 59 | Attending: Emergency Medicine | Admitting: Emergency Medicine

## 2014-11-17 ENCOUNTER — Other Ambulatory Visit: Payer: Self-pay | Admitting: Family Medicine

## 2014-11-17 DIAGNOSIS — Z794 Long term (current) use of insulin: Secondary | ICD-10-CM | POA: Insufficient documentation

## 2014-11-17 DIAGNOSIS — F329 Major depressive disorder, single episode, unspecified: Secondary | ICD-10-CM | POA: Insufficient documentation

## 2014-11-17 DIAGNOSIS — G43909 Migraine, unspecified, not intractable, without status migrainosus: Secondary | ICD-10-CM | POA: Diagnosis not present

## 2014-11-17 DIAGNOSIS — I10 Essential (primary) hypertension: Secondary | ICD-10-CM | POA: Diagnosis not present

## 2014-11-17 DIAGNOSIS — Z720 Tobacco use: Secondary | ICD-10-CM | POA: Diagnosis not present

## 2014-11-17 DIAGNOSIS — Z79899 Other long term (current) drug therapy: Secondary | ICD-10-CM | POA: Insufficient documentation

## 2014-11-17 DIAGNOSIS — G2581 Restless legs syndrome: Secondary | ICD-10-CM | POA: Insufficient documentation

## 2014-11-17 DIAGNOSIS — R079 Chest pain, unspecified: Secondary | ICD-10-CM | POA: Diagnosis present

## 2014-11-17 DIAGNOSIS — R0789 Other chest pain: Secondary | ICD-10-CM | POA: Diagnosis not present

## 2014-11-17 DIAGNOSIS — R0602 Shortness of breath: Secondary | ICD-10-CM | POA: Insufficient documentation

## 2014-11-17 DIAGNOSIS — E119 Type 2 diabetes mellitus without complications: Secondary | ICD-10-CM | POA: Insufficient documentation

## 2014-11-17 DIAGNOSIS — R000 Tachycardia, unspecified: Secondary | ICD-10-CM | POA: Insufficient documentation

## 2014-11-17 DIAGNOSIS — R112 Nausea with vomiting, unspecified: Secondary | ICD-10-CM | POA: Diagnosis not present

## 2014-11-17 DIAGNOSIS — E785 Hyperlipidemia, unspecified: Secondary | ICD-10-CM | POA: Insufficient documentation

## 2014-11-17 LAB — CBC WITH DIFFERENTIAL/PLATELET
Basophils Absolute: 0 10*3/uL (ref 0.0–0.1)
Basophils Relative: 0 % (ref 0–1)
EOS ABS: 0.1 10*3/uL (ref 0.0–0.7)
Eosinophils Relative: 1 % (ref 0–5)
HCT: 45.9 % (ref 39.0–52.0)
Hemoglobin: 16.6 g/dL (ref 13.0–17.0)
LYMPHS PCT: 23 % (ref 12–46)
Lymphs Abs: 2.3 10*3/uL (ref 0.7–4.0)
MCH: 30.7 pg (ref 26.0–34.0)
MCHC: 36.2 g/dL — AB (ref 30.0–36.0)
MCV: 84.8 fL (ref 78.0–100.0)
MONO ABS: 0.7 10*3/uL (ref 0.1–1.0)
Monocytes Relative: 7 % (ref 3–12)
NEUTROS ABS: 6.8 10*3/uL (ref 1.7–7.7)
NEUTROS PCT: 69 % (ref 43–77)
Platelets: 208 10*3/uL (ref 150–400)
RBC: 5.41 MIL/uL (ref 4.22–5.81)
RDW: 12.8 % (ref 11.5–15.5)
WBC: 9.8 10*3/uL (ref 4.0–10.5)

## 2014-11-17 LAB — COMPREHENSIVE METABOLIC PANEL
ALT: 16 U/L (ref 0–53)
ANION GAP: 11 (ref 5–15)
AST: 19 U/L (ref 0–37)
Albumin: 4.9 g/dL (ref 3.5–5.2)
Alkaline Phosphatase: 93 U/L (ref 39–117)
BUN: 13 mg/dL (ref 6–23)
CALCIUM: 9.9 mg/dL (ref 8.4–10.5)
CO2: 25 mmol/L (ref 19–32)
Chloride: 101 mEq/L (ref 96–112)
Creatinine, Ser: 0.95 mg/dL (ref 0.50–1.35)
GFR calc non Af Amer: >90 mL/min (ref >90)
GLUCOSE: 191 mg/dL — AB (ref 70–99)
Potassium: 4 mmol/L (ref 3.5–5.1)
Sodium: 137 mmol/L (ref 135–145)
TOTAL PROTEIN: 8.3 g/dL (ref 6.0–8.3)
Total Bilirubin: 1 mg/dL (ref 0.3–1.2)

## 2014-11-17 LAB — TROPONIN I
Troponin I: <0.03 ng/mL (ref <0.031)
Troponin I: <0.03 ng/mL (ref <0.031)

## 2014-11-17 LAB — D-DIMER, QUANTITATIVE (NOT AT ARMC)

## 2014-11-17 LAB — CBG MONITORING, ED: Glucose-Capillary: 192 mg/dL — ABNORMAL HIGH (ref 70–99)

## 2014-11-17 MED ORDER — HYDROMORPHONE HCL 1 MG/ML IJ SOLN
0.5000 mg | Freq: Once | INTRAMUSCULAR | Status: AC
  Administered 2014-11-17: 0.5 mg via INTRAVENOUS
  Filled 2014-11-17: qty 1

## 2014-11-17 MED ORDER — HYDROMORPHONE HCL 1 MG/ML IJ SOLN
1.0000 mg | INTRAMUSCULAR | Status: AC
  Administered 2014-11-17: 1 mg via INTRAVENOUS
  Filled 2014-11-17: qty 1

## 2014-11-17 MED ORDER — SODIUM CHLORIDE 0.9 % IV BOLUS (SEPSIS): 1000.0000 mL | INTRAVENOUS | Status: DC

## 2014-11-17 MED ORDER — PROMETHAZINE HCL 25 MG/ML IJ SOLN
12.5000 mg | Freq: Once | INTRAMUSCULAR | Status: AC
  Administered 2014-11-17: 12.5 mg via INTRAVENOUS
  Filled 2014-11-17: qty 1

## 2014-11-17 MED ORDER — SODIUM CHLORIDE 0.9 % IV BOLUS (SEPSIS)
500.0000 mL | INTRAVENOUS | Status: AC
  Administered 2014-11-17 (×2): 500 mL via INTRAVENOUS

## 2014-11-17 NOTE — ED Notes (Signed)
Pt reports central chest pain that started yesterday.  Reports associated with dizziness, sweating, nausea and shortness of breath.  Reports last time was told it was a panic attack but took meds for that prior to arrival and it has not helped.  Also reports pain to left side of head.
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
## 2014-11-17 NOTE — ED Notes (Signed)
MD made aware pt requesting pain med

## 2014-11-17 NOTE — ED Notes (Signed)
MD at bedside. 

## 2014-11-17 NOTE — Discharge Instructions (Signed)

## 2014-11-18 NOTE — Telephone Encounter (Signed)
Requesting Gabapentin 300mg -Take 3 capsule by mouth 3 times a day. Last refill:04/22/14;#270,1 Last OV:11/11/14 Please advise.//AB/CMA

## 2014-12-02 ENCOUNTER — Other Ambulatory Visit: Payer: Self-pay | Admitting: Endocrinology

## 2014-12-03 ENCOUNTER — Other Ambulatory Visit: Payer: Self-pay | Admitting: Family Medicine

## 2014-12-05 NOTE — Telephone Encounter (Signed)
Rx sent to the pharmacy by e-script.//AB/CMA 

## 2014-12-07 ENCOUNTER — Other Ambulatory Visit: Payer: Self-pay | Admitting: Family Medicine

## 2014-12-09 ENCOUNTER — Encounter (HOSPITAL_COMMUNITY): Payer: Self-pay | Admitting: Pharmacy Technician

## 2014-12-09 MED ORDER — TRAMADOL HCL 50 MG PO TABS: 50.0000 mg | ORAL_TABLET | Freq: Four times a day (QID) | ORAL | Status: DC | PRN

## 2014-12-09 NOTE — Telephone Encounter (Signed)
Requesting Tramadol 50mg -Take 1 tablet by mouth every 6 hours as needed. Last refill:11/11/14;#120,0 Last OV:11/11/14 UDS:08-14-14-Moderate Risk-next-11/13/14 and needs to sign contract Please advise.//AB/CMA

## 2014-12-09 NOTE — Telephone Encounter (Signed)
He can have refill but has not signed contract yet and is not due til end of week. Print today for me to sign since I will not be able to sign at end of week. He needs to sign contract

## 2014-12-10 ENCOUNTER — Encounter: Payer: Self-pay | Admitting: Physician Assistant

## 2014-12-10 ENCOUNTER — Encounter (HOSPITAL_COMMUNITY)
Admission: RE | Admit: 2014-12-10 | Discharge: 2014-12-10 | Disposition: A | Discharge status: Home / Self Care | Payer: 59 | Source: Ambulatory Visit | Attending: Orthopedic Surgery | Admitting: Orthopedic Surgery

## 2014-12-10 ENCOUNTER — Other Ambulatory Visit: Payer: Self-pay | Admitting: *Deleted

## 2014-12-10 ENCOUNTER — Encounter: Payer: Self-pay | Admitting: Endocrinology

## 2014-12-10 ENCOUNTER — Other Ambulatory Visit (INDEPENDENT_AMBULATORY_CARE_PROVIDER_SITE_OTHER): Payer: 59

## 2014-12-10 ENCOUNTER — Ambulatory Visit (INDEPENDENT_AMBULATORY_CARE_PROVIDER_SITE_OTHER): Payer: 59 | Admitting: Endocrinology

## 2014-12-10 ENCOUNTER — Encounter (HOSPITAL_COMMUNITY): Payer: Self-pay

## 2014-12-10 VITALS — BP 118/68 | HR 97 | Temp 97.7°F | Resp 12 | Wt 212.0 lb

## 2014-12-10 DIAGNOSIS — Z01818 Encounter for other preprocedural examination: Secondary | ICD-10-CM | POA: Insufficient documentation

## 2014-12-10 DIAGNOSIS — E1065 Type 1 diabetes mellitus with hyperglycemia: Principal | ICD-10-CM

## 2014-12-10 DIAGNOSIS — M25511 Pain in right shoulder: Secondary | ICD-10-CM | POA: Insufficient documentation

## 2014-12-10 DIAGNOSIS — E1041 Type 1 diabetes mellitus with diabetic mononeuropathy: Secondary | ICD-10-CM

## 2014-12-10 DIAGNOSIS — IMO0002 Reserved for concepts with insufficient information to code with codable children: Secondary | ICD-10-CM

## 2014-12-10 DIAGNOSIS — I10 Essential (primary) hypertension: Secondary | ICD-10-CM

## 2014-12-10 DIAGNOSIS — E1049 Type 1 diabetes mellitus with other diabetic neurological complication: Secondary | ICD-10-CM

## 2014-12-10 HISTORY — DX: Presence of spectacles and contact lenses: Z97.3

## 2014-12-10 HISTORY — DX: Nontoxic multinodular goiter: E04.2

## 2014-12-10 HISTORY — DX: Panic disorder (episodic paroxysmal anxiety): F41.0

## 2014-12-10 HISTORY — DX: Pneumonia, unspecified organism: J18.9

## 2014-12-10 HISTORY — DX: Carpal tunnel syndrome, right upper limb: G56.01

## 2014-12-10 LAB — LIPID PANEL
Cholesterol: 124 mg/dL (ref 0–200)
HDL: 38.2 mg/dL — ABNORMAL LOW (ref >39.00)
LDL Cholesterol: 47 mg/dL (ref 0–99)
NonHDL: 85.8
TRIGLYCERIDES: 193 mg/dL — AB (ref 0.0–149.0)
Total CHOL/HDL Ratio: 3
VLDL: 38.6 mg/dL (ref 0.0–40.0)

## 2014-12-10 LAB — BASIC METABOLIC PANEL
Anion gap: 11 (ref 5–15)
BUN: 15 mg/dL (ref 6–23)
CHLORIDE: 103 meq/L (ref 96–112)
CO2: 24 mmol/L (ref 19–32)
CREATININE: 0.92 mg/dL (ref 0.50–1.35)
Calcium: 9.5 mg/dL (ref 8.4–10.5)
GFR calc Af Amer: >90 mL/min (ref >90)
Glucose, Bld: 99 mg/dL (ref 70–99)
POTASSIUM: 3.3 mmol/L — AB (ref 3.5–5.1)
Sodium: 138 mmol/L (ref 135–145)

## 2014-12-10 LAB — CBC
HCT: 40.5 % (ref 39.0–52.0)
Hemoglobin: 14.8 g/dL (ref 13.0–17.0)
MCH: 31 pg (ref 26.0–34.0)
MCHC: 36.5 g/dL — ABNORMAL HIGH (ref 30.0–36.0)
MCV: 84.9 fL (ref 78.0–100.0)
PLATELETS: 209 10*3/uL (ref 150–400)
RBC: 4.77 MIL/uL (ref 4.22–5.81)
RDW: 13.2 % (ref 11.5–15.5)
WBC: 12.7 10*3/uL — ABNORMAL HIGH (ref 4.0–10.5)

## 2014-12-10 LAB — GLUCOSE, RANDOM: Glucose, Bld: 86 mg/dL (ref 70–99)

## 2014-12-10 LAB — MICROALBUMIN / CREATININE URINE RATIO
CREATININE, U: 307.6 mg/dL
Microalb Creat Ratio: 3.2 mg/g (ref 0.0–30.0)
Microalb, Ur: 9.7 mg/dL — ABNORMAL HIGH (ref 0.0–1.9)

## 2014-12-10 LAB — HEMOGLOBIN A1C: Hgb A1c MFr Bld: 9.2 % — ABNORMAL HIGH (ref 4.6–6.5)

## 2014-12-10 MED ORDER — INSULIN ASPART 100 UNIT/ML ~~LOC~~ SOLN: SUBCUTANEOUS | Status: DC

## 2014-12-10 MED ORDER — TRAMADOL HCL 50 MG PO TABS: 50.0000 mg | ORAL_TABLET | Freq: Four times a day (QID) | ORAL | Status: DC | PRN

## 2014-12-10 NOTE — Telephone Encounter (Signed)
Rx request printed for provider signature, pending CSC signed & UDS given today/SLS

## 2014-12-10 NOTE — Progress Notes (Signed)
Rx request printed for provider signature, pending CSC signed & UDS given today/SLS

## 2014-12-10 NOTE — Progress Notes (Signed)
   12/10/14 1445  OBSTRUCTIVE SLEEP APNEA  Have you ever been diagnosed with sleep apnea through a sleep study? No  Do you snore loudly (loud enough to be heard through closed doors)?  1  Do you often feel tired, fatigued, or sleepy during the daytime? 0  Has anyone observed you stop breathing during your sleep? 0  Do you have, or are you being treated for high blood pressure? 1  BMI more than 35 kg/m2? 0  Age over 44 years old? 0  Neck circumference greater than 40 cm/16 inches? 1  Gender: 1  Obstructive Sleep Apnea Score 4

## 2014-12-10 NOTE — Progress Notes (Signed)
Pt made aware to stop taking Aspirin, vitamins, and herbal medications. Do not take any NSAIDs ie: Ibuprofen, Advil, Naproxen or any medication containing Aspirin; stop now.
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
## 2014-12-10 NOTE — Progress Notes (Signed)
Pt denies SOB, chest pain, and being under the care of a cardiologist. Pt denies having an echo but stated that he had a stress test and cardiac cath >10 years ago and the results were negative, " it was all from panic attacks, they just wanted to make sure since I am a diabetic." The cardiac studies were completed at [place] scheduled to see Dr. Dwyane Dee,  endocrinologist today, after consulting with Ebony Hail, [place] ( anesthesia),  it is okay for patient to get pre-op instructions for insulin pump from Dr. Dwyane Dee. I will follow-up with pt regarding MD's instructions. Pt chart forwarded to [place], [place] for review of EKG.

## 2014-12-10 NOTE — Pre-Procedure Instructions (Addendum)
SHALIN VONBARGEN  12/10/2014   Your procedure is scheduled on: Thursday, December 18, 2014  Report to [facility] Admitting at 6:00 AM.  Call this number if you have problems the morning of surgery: [phone]   Remember:   Do not eat food or drink liquids after midnight Wednesday, December 17, 2014   Take these medicines the morning of surgery with A SIP OF WATER: amLODipine (NORVASC),DULoxetine (CYMBALTA), gabapentin (NEURONTIN),  pantoprazole (PROTONIX) if needed: pain medication, diazepam (VALIUM) for anxiety or muscle spasms    Do not wear jewelry, make-up or nail polish.  Do not wear lotions, powders, or perfumes. You may not  wear deodorant.  Do not shave 48 hours prior to surgery. Men may shave face and neck.  Do not bring valuables to the hospital.  [facility] is not responsible for any belongings or valuables.               Contacts, dentures or bridgework may not be worn into surgery.  Leave suitcase in the car. After surgery it may be brought to your room.  For patients admitted to the hospital, discharge time is determined by your treatment team.               Patients discharged the day of surgery will not be allowed to drive home.  Name and phone number of your driver:   Special Instructions:  Special Instructions:Special Instructions: [facility] - Preparing for Surgery  Before surgery, you can play an important role.  Because skin is not sterile, your skin needs to be as free of germs as possible.  You can reduce the number of germs on you skin by washing with CHG (chlorahexidine gluconate) soap before surgery.  CHG is an antiseptic cleaner which kills germs and bonds with the skin to continue killing germs even after washing.  Please DO NOT use if you have an allergy to CHG or antibacterial soaps.  If your skin becomes reddened/irritated stop using the CHG and inform your nurse when you arrive at Short Stay.  Do not shave (including legs and underarms) for at  least 48 hours prior to the first CHG shower.  You may shave your face.  Please follow these instructions carefully:   1.  Shower with CHG Soap the night before surgery and the morning of Surgery.  2.  If you choose to wash your hair, wash your hair first as usual with your normal shampoo.  3.  After you shampoo, rinse your hair and body thoroughly to remove the Shampoo.  4.  Use CHG as you would any other liquid soap.  You can apply chg directly  to the skin and wash gently with scrungie or a clean washcloth.  5.  Apply the CHG Soap to your body ONLY FROM THE NECK DOWN.  Do not use on open wounds or open sores.  Avoid contact with your eyes, ears, mouth and genitals (private parts).  Wash genitals (private parts) with your normal soap.  6.  Wash thoroughly, paying special attention to the area where your surgery will be performed.  7.  Thoroughly rinse your body with warm water from the neck down.  8.  DO NOT shower/wash with your normal soap after using and rinsing off the CHG Soap.  9.  Pat yourself dry with a clean towel.            10.  Wear clean pajamas.  11.  Place clean sheets on your bed the night of your first shower and do not sleep with pets.  Day of Surgery  Do not apply any lotions/deodorants the morning of surgery.  Please wear clean clothes to the hospital/surgery center.   Please read over the following fact sheets that you were given: Pain Booklet and Coughing and Deep Breathing

## 2014-12-11 ENCOUNTER — Encounter (HOSPITAL_COMMUNITY): Payer: Self-pay

## 2014-12-11 ENCOUNTER — Encounter (HOSPITAL_COMMUNITY): Payer: Self-pay | Admitting: Anesthesiology

## 2014-12-11 ENCOUNTER — Encounter (HOSPITAL_COMMUNITY): Payer: Self-pay | Admitting: Vascular Surgery

## 2014-12-11 NOTE — Progress Notes (Signed)
Anesthesia Chart Review:  Patient is a 44 year old male scheduled for MRI right shoulder under anesthesia on 12/18/14. MRI was ordered by Dr. Marlou Sa.  History includes smoking, HTN, DM1, depression, anxiety, GERD, migraines, HLD, RLS, thyroid nodules, prior drug abuse.  OSA screening score was 4. PCP is listed as Dr. Penni Homans. Endocrinologist is Dr. Dwyane Dee, last visit 12/10/14 with insulin adjustment (has insulin pump). He was seen in ED for atypical chest pain on 11/17/14. Troponin and D-dimer were negative. ED physician Dr. Pamella Pert felt patient was low risk for MACE per HEART score and deferred further recommendations to his PCP. Patient denied CP at his PAT appointment.   Medications include amlodipine, Lipitor, benazepril, Cialis, Valium, Cymbalta, Neurontin, Norco, Novolog, Protonix, Imitrex, tramadol.  EKG 11/17/14: ST at 111 bpm, rightward axis, incomplete right BBB. Reportedly he had a "negative" cardiac cath and stress test > 10 years ago at [facility].  CXR 11/17/14: No active cardiopulmonary disease.  Preoperative labs noted.  A1C 9.2 on 12/10/14.  Patient last seen by Dr. Marlou Sa on 11/05/14, Dr. Charlett Blake on 11/16/14, and Dr. Dwyane Dee on 12/10/14. Reviewed above with anesthesiologist Dr. Linna Caprice.  His assigned anesthesiologist can update if any changes since he has had recent follow-up.  [facility] Short Stay Center/Anesthesiology Phone [phone] 12/11/2014 4:04 PM

## 2014-12-15 NOTE — Progress Notes (Signed)
Quick Note:  Please let patient know that the A1c is higher at 9.2 and to call if blood sugars are not improving within the next 2 weeks, cholesterol good ______

## 2014-12-16 ENCOUNTER — Ambulatory Visit (HOSPITAL_COMMUNITY): Payer: 59

## 2014-12-18 ENCOUNTER — Ambulatory Visit (HOSPITAL_COMMUNITY)
Admission: RE | Admit: 2014-12-18 | Discharge: 2014-12-18 | Disposition: A | Discharge status: Home / Self Care | Payer: 59 | Source: Ambulatory Visit | Attending: Orthopedic Surgery | Admitting: Orthopedic Surgery

## 2014-12-18 ENCOUNTER — Encounter (HOSPITAL_COMMUNITY): Payer: Self-pay | Admitting: *Deleted

## 2014-12-18 ENCOUNTER — Telehealth: Payer: Self-pay | Admitting: Family Medicine

## 2014-12-18 ENCOUNTER — Ambulatory Visit (HOSPITAL_COMMUNITY): Admission: RE | Admit: 2014-12-18 | Payer: 59 | Source: Ambulatory Visit

## 2014-12-18 ENCOUNTER — Encounter (HOSPITAL_COMMUNITY)
Admission: RE | Disposition: A | Discharge status: Home / Self Care | Payer: Self-pay | Source: Ambulatory Visit | Attending: Orthopedic Surgery

## 2014-12-18 DIAGNOSIS — M25511 Pain in right shoulder: Secondary | ICD-10-CM | POA: Insufficient documentation

## 2014-12-18 DIAGNOSIS — Z794 Long term (current) use of insulin: Secondary | ICD-10-CM | POA: Insufficient documentation

## 2014-12-18 DIAGNOSIS — Z5309 Procedure and treatment not carried out because of other contraindication: Secondary | ICD-10-CM | POA: Diagnosis not present

## 2014-12-18 DIAGNOSIS — M25519 Pain in unspecified shoulder: Secondary | ICD-10-CM | POA: Diagnosis present

## 2014-12-18 HISTORY — PX: RADIOLOGY WITH ANESTHESIA: SHX6223

## 2014-12-18 LAB — GLUCOSE, CAPILLARY
GLUCOSE-CAPILLARY: 350 mg/dL — AB (ref 70–99)
Glucose-Capillary: 284 mg/dL — ABNORMAL HIGH (ref 70–99)

## 2014-12-18 SURGERY — RADIOLOGY WITH ANESTHESIA
Anesthesia: General | Laterality: Right

## 2014-12-18 MED ORDER — IOHEXOL 180 MG/ML  SOLN: 20.0000 mL | Freq: Once | INTRAMUSCULAR | Status: AC | PRN

## 2014-12-18 MED ORDER — GADOBENATE DIMEGLUMINE 529 MG/ML IV SOLN: 5.0000 mL | Freq: Once | INTRAVENOUS | Status: AC | PRN

## 2014-12-18 MED ORDER — ATORVASTATIN CALCIUM 80 MG PO TABS: ORAL_TABLET | ORAL | Status: DC

## 2014-12-18 NOTE — Telephone Encounter (Signed)
Requesting Valium 5mg -Take 1 tablet by mouth 2 times daily as needed for anxiety or muscle spasms. Last refill:08/22/14;#60,3 Last OV:11/11/14 Please advise.//AB/CMA    Rx for the Lipitor was sent to the pharmacy by e-script.//AB/CMA

## 2014-12-18 NOTE — Telephone Encounter (Signed)
Ok to refill his Diazepam with same sig, with same number and 2 rf, make sure he know he needs an appt to get more after that

## 2014-12-18 NOTE — Telephone Encounter (Signed)
Caller name:Swaby Olivia Mackie Relation to SQ:ZYTM Call back number:[phone] Pharmacy:[facility]   Reason for call: pt is needing rx diazepam (VALIUM) 5 MG tablet  And atorvastatin (LIPITOR) 80 MG tablet.

## 2014-12-18 NOTE — Anesthesia Preprocedure Evaluation (Deleted)
Anesthesia Evaluation    Airway        Dental   Pulmonary Current Smoker,          Cardiovascular hypertension,     Neuro/Psych    GI/Hepatic   Endo/Other  diabetes  Renal/GU      Musculoskeletal   Abdominal   Peds  Hematology   Anesthesia Other Findings   Reproductive/Obstetrics                            Anesthesia Physical Anesthesia Plan  ASA:   Anesthesia Plan:    Post-op Pain Management:    Induction:   Airway Management Planned:   Additional Equipment:   Intra-op Plan:   Post-operative Plan:   Informed Consent:   Plan Discussed with:   Anesthesia Plan Comments: (Pt with very poorly controlled type I diabetes (A1C 9.2). Pt saw his endocrinologist 12/10/14, but no clear recommendations for insulin management for his MRI. Additionally, pt apparently self managing his insulin pump and "miscalculated his dose" after eating pizza last night. Likely in DKA with blood glc of ~450 this am. Gave bolus dose. Glc 350 here. Rechecked ~30 min later and glc 285. Had frank discussion with patient that I do not feel comfortable anesthetizing him without the ability to monitor blood glc when he is clearly out of control and very brittle. He became agitated and said that I wasted his time. I explained that I understand he is in pain, but that his DM is the issue to focus on because it will claim his life not properly managed. )      Anesthesia Quick Evaluation

## 2014-12-18 NOTE — Progress Notes (Signed)
Per Dr. Judson Roch, pt's MRI under anesthesia cancelled for today. Pt aware. IV removed by CRNA. CRNA also reported to this nurse that she would let radiology know that pt was cancelled and would need to call pt to reschedule.

## 2014-12-18 NOTE — Progress Notes (Signed)
CBG=350, pt reports giving himself 21 units via insulin pump at 0500 this morning for CBG of 477. Pt gave an additional 2.2 units via insulin pump at 0700. Dr. Judson Roch with anesthesia in and made aware at 0710.

## 2014-12-19 ENCOUNTER — Encounter (HOSPITAL_COMMUNITY): Payer: Self-pay | Admitting: Radiology

## 2014-12-19 MED ORDER — DIAZEPAM 5 MG PO TABS: ORAL_TABLET | ORAL | Status: DC

## 2014-12-19 NOTE — Telephone Encounter (Signed)
Rx printed,signed and faxed to the pharmacy.  Pt informed of the note below.  Pt agreed.//AB/CMA

## 2015-01-07 ENCOUNTER — Encounter (HOSPITAL_COMMUNITY): Payer: Self-pay

## 2015-01-07 ENCOUNTER — Encounter (HOSPITAL_COMMUNITY)
Admission: RE | Admit: 2015-01-07 | Discharge: 2015-01-07 | Disposition: A | Discharge status: Home / Self Care | Payer: 59 | Source: Ambulatory Visit | Attending: Orthopedic Surgery | Admitting: Orthopedic Surgery

## 2015-01-07 DIAGNOSIS — Z01818 Encounter for other preprocedural examination: Secondary | ICD-10-CM | POA: Diagnosis not present

## 2015-01-07 DIAGNOSIS — M25511 Pain in right shoulder: Secondary | ICD-10-CM | POA: Insufficient documentation

## 2015-01-07 LAB — BASIC METABOLIC PANEL
Anion gap: 9 (ref 5–15)
BUN: 11 mg/dL (ref 6–23)
CALCIUM: 9.4 mg/dL (ref 8.4–10.5)
CO2: 25 mmol/L (ref 19–32)
CREATININE: 0.91 mg/dL (ref 0.50–1.35)
Chloride: 105 mmol/L (ref 96–112)
GFR calc non Af Amer: >90 mL/min (ref >90)
Glucose, Bld: 181 mg/dL — ABNORMAL HIGH (ref 70–99)
Potassium: 3.8 mmol/L (ref 3.5–5.1)
Sodium: 139 mmol/L (ref 135–145)

## 2015-01-07 LAB — CBC
HCT: 42.8 % (ref 39.0–52.0)
Hemoglobin: 15.2 g/dL (ref 13.0–17.0)
MCH: 30.4 pg (ref 26.0–34.0)
MCHC: 35.5 g/dL (ref 30.0–36.0)
MCV: 85.6 fL (ref 78.0–100.0)
Platelets: 179 10*3/uL (ref 150–400)
RBC: 5 MIL/uL (ref 4.22–5.81)
RDW: 13.2 % (ref 11.5–15.5)
WBC: 6.9 10*3/uL (ref 4.0–10.5)

## 2015-01-07 NOTE — Progress Notes (Signed)
Left voice message with Dr. Randel Pigg surgical scheduler Maudie Mercury, to make MD aware that pt needs an H & P completed within 30 days of procedure in order for MRI to be done.

## 2015-01-07 NOTE — Pre-Procedure Instructions (Signed)
[name]  01/07/2015   Your procedure is scheduled on: Thursday, January 15, 2015  Report to [facility] Admitting at 6:00 AM.  Call this number if you have problems the morning of surgery: [phone]   Remember: Follow doctors instructions regarding insulin pump   Do not eat food or drink liquids after midnight   Take these medicines the morning of surgery with A SIP OF WATER: amLODipine (NORVASC),DULoxetine (CYMBALTA), gabapentin (NEURONTIN), pantoprazole (PROTONIX) if needed: pain medication, diazepam (VALIUM) for anxiety or muscle spasms   Stop taking vitamins, and herbal medications.   Do not wear jewelry, make-up or nail polish.  Do not wear lotions, powders, or perfumes. You may not wear deodorant.  Do not shave 48 hours prior to surgery. Men may shave face and neck.  Do not bring valuables to the hospital.  [facility] is not responsible for any belongings or valuables.               Contacts, dentures or bridgework may not be worn into surgery.  Leave suitcase in the car. After surgery it may be brought to your room.  For patients admitted to the hospital, discharge time is determined by your treatment team.               Patients discharged the day of surgery will not be allowed to drive home.  Name and phone number of your driver:   Special Instructions:  Special Instructions:Special Instructions: [facility] - Preparing for Surgery  Before surgery, you can play an important role.  Because skin is not sterile, your skin needs to be as free of germs as possible.  You can reduce the number of germs on you skin by washing with CHG (chlorahexidine gluconate) soap before surgery.  CHG is an antiseptic cleaner which kills germs and bonds with the skin to continue killing germs even after washing.  Please DO NOT use if you have an allergy to CHG or antibacterial soaps.  If your skin becomes reddened/irritated stop using the CHG and inform your nurse when you arrive  at Short Stay.  Do not shave (including legs and underarms) for at least 48 hours prior to the first CHG shower.  You may shave your face.  Please follow these instructions carefully:   1.  Shower with CHG Soap the night before surgery and the morning of Surgery.  2.  If you choose to wash your hair, wash your hair first as usual with your normal shampoo.  3.  After you shampoo, rinse your hair and body thoroughly to remove the Shampoo.  4.  Use CHG as you would any other liquid soap.  You can apply chg directly  to the skin and wash gently with scrungie or a clean washcloth.  5.  Apply the CHG Soap to your body ONLY FROM THE NECK DOWN.  Do not use on open wounds or open sores.  Avoid contact with your eyes, ears, mouth and genitals (private parts).  Wash genitals (private parts) with your normal soap.  6.  Wash thoroughly, paying special attention to the area where your surgery will be performed.  7.  Thoroughly rinse your body with warm water from the neck down.  8.  DO NOT shower/wash with your normal soap after using and rinsing off the CHG Soap.  9.  Pat yourself dry with a clean towel.            10.  Wear clean pajamas.  11.  Place clean sheets on your bed the night of your first shower and do not sleep with pets.  Day of Surgery  Do not apply any lotions/deodorants the morning of surgery.  Please wear clean clothes to the hospital/surgery center.   Please read over the following fact sheets that you were given: Pain Booklet and Coughing and Deep Breathing

## 2015-01-07 NOTE — Progress Notes (Signed)
   01/07/15 1519  OBSTRUCTIVE SLEEP APNEA  Have you ever been diagnosed with sleep apnea through a sleep study? No  Do you snore loudly (loud enough to be heard through closed doors)?  1  Do you often feel tired, fatigued, or sleepy during the daytime? 0  Has anyone observed you stop breathing during your sleep? 0  Do you have, or are you being treated for high blood pressure? 1  BMI more than 35 kg/m2? 0  Age over 44 years old? 0  Neck circumference greater than 40 cm/16 inches? 1  Gender: 1  Obstructive Sleep Apnea Score 4

## 2015-01-07 NOTE — Progress Notes (Signed)
Pt denies SOB, chest pain, and being under the care of a cardiologist. Pt denies having an echo but stated that he had a stress test and cardiac cath > 10 years ago at [place] with Maudie Mercury, Dr. Randel Pigg surgical scheduler, Apolonio Schneiders and Abigail Butts regarding the pt needing a current H & P within 30 days of scheduled MRI.

## 2015-01-09 ENCOUNTER — Other Ambulatory Visit: Payer: Self-pay | Admitting: Physician Assistant

## 2015-01-09 MED ORDER — TRAMADOL HCL 50 MG PO TABS: 50.0000 mg | ORAL_TABLET | Freq: Four times a day (QID) | ORAL | Status: DC | PRN

## 2015-01-09 NOTE — Telephone Encounter (Signed)
Faxed hardcopy for Tramadol to [employer] per [employer]

## 2015-01-13 NOTE — Progress Notes (Addendum)
Anesthesia follow-up:  See my note from 12/11/14.  MRI with anesthesia was canceled on 12/18/14 for fasting glucose of 350 (see notation by anesthesiologist Dr. Lissa Hoard in his anesthesia record). He has an insulin pump. Endocrinologist is Dr. Dwyane Dee, last office visit 12/10/14. Dr. Marlou Sa is ordering physician.  Initial evaluation/H&P 11/05/14, but saw patient for an update on 01/07/15.    Preoperative labs noted. Non-fasting glucose 181.  A1C 9.2.    I've left a voice message for patient to call me to review his current DM control, DM follow-up, etc.  I want to ensure that patient understands that his glucose will need to be controlled when he arrives for this procedure.  If not, he runs the risk of getting this procedure canceled again. Endocrinology input regarding insulin pump peri-procedure management (via visit or telephone) would be recommended, particularly if any more significant fasting hyperglycemic events. He will get a fasting CBG on arrival.  I have discussed with anesthesiologist Dr. Jenita Seashore. Further input once patient returns my call.    George Hugh [facility] Short Stay Center/Anesthesiology Phone [phone] 01/13/2015 3:18 PM  Addendum: Patient never returned my call.  George Hugh [facility] Short Stay Center/Anesthesiology Phone [phone] 01/14/2015 6:09 PM

## 2015-01-15 ENCOUNTER — Ambulatory Visit (HOSPITAL_COMMUNITY): Payer: 59 | Admitting: Vascular Surgery

## 2015-01-15 ENCOUNTER — Encounter (HOSPITAL_COMMUNITY)
Admission: RE | Disposition: A | Discharge status: Home / Self Care | Payer: 59 | Source: Ambulatory Visit | Attending: Orthopedic Surgery

## 2015-01-15 ENCOUNTER — Ambulatory Visit (HOSPITAL_COMMUNITY)
Admission: RE | Admit: 2015-01-15 | Discharge: 2015-01-15 | Disposition: A | Discharge status: Home / Self Care | Payer: 59 | Source: Ambulatory Visit | Attending: Orthopedic Surgery | Admitting: Orthopedic Surgery

## 2015-01-15 ENCOUNTER — Encounter (HOSPITAL_COMMUNITY): Payer: Self-pay | Admitting: *Deleted

## 2015-01-15 ENCOUNTER — Ambulatory Visit (HOSPITAL_COMMUNITY): Payer: 59 | Admitting: Anesthesiology

## 2015-01-15 DIAGNOSIS — M19011 Primary osteoarthritis, right shoulder: Secondary | ICD-10-CM | POA: Diagnosis not present

## 2015-01-15 DIAGNOSIS — G8929 Other chronic pain: Secondary | ICD-10-CM | POA: Insufficient documentation

## 2015-01-15 DIAGNOSIS — M25511 Pain in right shoulder: Secondary | ICD-10-CM | POA: Insufficient documentation

## 2015-01-15 DIAGNOSIS — M12811 Other specific arthropathies, not elsewhere classified, right shoulder: Secondary | ICD-10-CM | POA: Insufficient documentation

## 2015-01-15 DIAGNOSIS — M7581 Other shoulder lesions, right shoulder: Secondary | ICD-10-CM | POA: Insufficient documentation

## 2015-01-15 DIAGNOSIS — M7551 Bursitis of right shoulder: Secondary | ICD-10-CM | POA: Insufficient documentation

## 2015-01-15 HISTORY — PX: RADIOLOGY WITH ANESTHESIA: SHX6223

## 2015-01-15 LAB — GLUCOSE, CAPILLARY
Glucose-Capillary: 146 mg/dL — ABNORMAL HIGH (ref 70–99)
Glucose-Capillary: 210 mg/dL — ABNORMAL HIGH (ref 70–99)

## 2015-01-15 SURGERY — RADIOLOGY WITH ANESTHESIA
Anesthesia: General | Laterality: Right

## 2015-01-15 MED ORDER — IOHEXOL 180 MG/ML  SOLN
20.0000 mL | Freq: Once | INTRAMUSCULAR | Status: AC | PRN
  Administered 2015-01-15: 15 mL via INTRA_ARTICULAR

## 2015-01-15 MED ORDER — OXYCODONE HCL 5 MG PO TABS
ORAL_TABLET | ORAL | Status: AC
  Filled 2015-01-15: qty 1

## 2015-01-15 MED ORDER — OXYCODONE HCL 5 MG PO TABS
5.0000 mg | ORAL_TABLET | Freq: Once | ORAL | Status: AC
  Administered 2015-01-15: 5 mg via ORAL

## 2015-01-15 MED ORDER — GADOBENATE DIMEGLUMINE 529 MG/ML IV SOLN
5.0000 mL | Freq: Once | INTRAVENOUS | Status: AC | PRN
  Administered 2015-01-15: 0.1 mL via INTRAVENOUS

## 2015-01-15 NOTE — Anesthesia Preprocedure Evaluation (Addendum)
Anesthesia Evaluation  Patient identified by MRN, date of birth, ID band Patient awake    Reviewed: Allergy & Precautions, NPO status , Patient's Chart, lab work & pertinent test results  Airway Mallampati: II  TM Distance: >3 FB Neck ROM: Full    Dental no notable dental hx.    Pulmonary pneumonia -, Current Smoker,  breath sounds clear to auscultation  Pulmonary exam normal       Cardiovascular hypertension, Pt. on medications Rhythm:Regular Rate:Normal     Neuro/Psych  Headaches, PSYCHIATRIC DISORDERS Anxiety Depression    GI/Hepatic Neg liver ROS, GERD-  ,  Endo/Other  diabetes, Poorly Controlled, Type 2, Oral Hypoglycemic Agents  Renal/GU negative Renal ROS     Musculoskeletal negative musculoskeletal ROS (+)   Abdominal   Peds  Hematology negative hematology ROS (+)   Anesthesia Other Findings   Reproductive/Obstetrics negative OB ROS                           Anesthesia Physical  Anesthesia Plan  ASA: III  Anesthesia Plan: General   Post-op Pain Management:    Induction: Intravenous  Airway Management Planned: LMA  Additional Equipment: None  Intra-op Plan:   Post-operative Plan: Extubation in OR  Informed Consent: I have reviewed the patients History and Physical, chart, labs and discussed the procedure including the risks, benefits and alternatives for the proposed anesthesia with the patient or authorized representative who has indicated his/her understanding and acceptance.   Dental advisory given  Plan Discussed with: CRNA  Anesthesia Plan Comments:        Anesthesia Quick Evaluation

## 2015-01-15 NOTE — Anesthesia Postprocedure Evaluation (Signed)
Anesthesia Post Note  Patient: [name]  Procedure(s) Performed: Procedure(s) (LRB): MRI OF RIGHT SHOULDER WITH CONTRAST (Right)  Anesthesia type: General  Patient location: PACU  Post pain: Pain level controlled  Post assessment: Post-op Vital signs reviewed  Last Vitals: BP 126/82 mmHg  Pulse 94  Temp(Src) 36.1 C (Oral)  Resp 23  Ht 5\' 10"  (1.778 m)  Wt 217 lb (98.431 kg)  BMI 31.14 kg/m2  SpO2 95%  Post vital signs: Reviewed  Level of consciousness: sedated  Complications: No apparent anesthesia complications

## 2015-01-15 NOTE — Transfer of Care (Signed)
Immediate Anesthesia Transfer of Care Note  Patient: [name]  Procedure(s) Performed: Procedure(s): MRI OF RIGHT SHOULDER WITH CONTRAST (Right)  Patient Location: PACU  Anesthesia Type:General  Level of Consciousness: awake, alert  and oriented  Airway & Oxygen Therapy: Patient Spontanous Breathing  Post-op Assessment: Report given to RN  Post vital signs: Reviewed and stable  Last Vitals:  Filed Vitals:   01/15/15 0635  BP: 133/92  Pulse: 86  Temp: 36.4 C  Resp: 18    Complications: No apparent anesthesia complications

## 2015-01-16 ENCOUNTER — Encounter (HOSPITAL_COMMUNITY): Payer: Self-pay | Admitting: Radiology

## 2015-01-19 ENCOUNTER — Other Ambulatory Visit: Payer: Self-pay | Admitting: Family Medicine

## 2015-01-21 MED FILL — Lidocaine HCl IV Inj 20 MG/ML: INTRAVENOUS | Qty: 5 | Status: AC

## 2015-01-21 MED FILL — Ephedrine Sulfate Inj 50 MG/ML: INTRAMUSCULAR | Qty: 1 | Status: AC

## 2015-01-21 MED FILL — Propofol IV Emul 10 MG/ML: INTRAVENOUS | Qty: 20 | Status: AC

## 2015-01-21 MED FILL — Midazolam HCl Inj 2 MG/2ML (Base Equivalent): INTRAMUSCULAR | Qty: 2 | Status: AC

## 2015-01-21 MED FILL — Fentanyl Citrate Inj 0.05 MG/ML: INTRAMUSCULAR | Qty: 2 | Status: AC

## 2015-01-22 ENCOUNTER — Other Ambulatory Visit (HOSPITAL_COMMUNITY): Payer: Self-pay | Admitting: Orthopedic Surgery

## 2015-01-26 ENCOUNTER — Encounter (HOSPITAL_COMMUNITY): Payer: Self-pay | Admitting: *Deleted

## 2015-01-26 MED ORDER — PROMETHAZINE HCL 25 MG/ML IJ SOLN: 6.2500 mg | INTRAMUSCULAR | Status: DC | PRN

## 2015-01-26 MED ORDER — MEPERIDINE HCL 25 MG/ML IJ SOLN: 6.2500 mg | INTRAMUSCULAR | Status: DC | PRN

## 2015-01-26 NOTE — Progress Notes (Addendum)
Endocrinologist os Dr Dwyane Dee.  Patient reported that he has a Prednisone at Dr Randel Pigg office a few days ago and that CBG went to 500.  "CBG's have been in the 200's for a couple days. Patient is going to keep Insulin Pump at Basal rate and wear pump to the hospital.

## 2015-01-27 ENCOUNTER — Encounter (HOSPITAL_COMMUNITY)
Admission: RE | Disposition: A | Discharge status: Home / Self Care | Payer: Self-pay | Source: Ambulatory Visit | Attending: Orthopedic Surgery

## 2015-01-27 ENCOUNTER — Ambulatory Visit (HOSPITAL_COMMUNITY)
Admission: RE | Admit: 2015-01-27 | Discharge: 2015-01-27 | Disposition: A | Discharge status: Home / Self Care | Payer: 59 | Source: Ambulatory Visit | Attending: Orthopedic Surgery | Admitting: Orthopedic Surgery

## 2015-01-27 ENCOUNTER — Encounter (HOSPITAL_COMMUNITY): Payer: Self-pay | Admitting: *Deleted

## 2015-01-27 ENCOUNTER — Ambulatory Visit (HOSPITAL_COMMUNITY): Payer: 59 | Admitting: Anesthesiology

## 2015-01-27 DIAGNOSIS — Z7982 Long term (current) use of aspirin: Secondary | ICD-10-CM | POA: Diagnosis not present

## 2015-01-27 DIAGNOSIS — K219 Gastro-esophageal reflux disease without esophagitis: Secondary | ICD-10-CM | POA: Diagnosis not present

## 2015-01-27 DIAGNOSIS — F329 Major depressive disorder, single episode, unspecified: Secondary | ICD-10-CM | POA: Diagnosis not present

## 2015-01-27 DIAGNOSIS — G43909 Migraine, unspecified, not intractable, without status migrainosus: Secondary | ICD-10-CM | POA: Insufficient documentation

## 2015-01-27 DIAGNOSIS — G5601 Carpal tunnel syndrome, right upper limb: Secondary | ICD-10-CM | POA: Diagnosis not present

## 2015-01-27 DIAGNOSIS — Z79899 Other long term (current) drug therapy: Secondary | ICD-10-CM | POA: Diagnosis not present

## 2015-01-27 DIAGNOSIS — M659 Synovitis and tenosynovitis, unspecified: Secondary | ICD-10-CM | POA: Insufficient documentation

## 2015-01-27 DIAGNOSIS — I10 Essential (primary) hypertension: Secondary | ICD-10-CM | POA: Insufficient documentation

## 2015-01-27 DIAGNOSIS — E119 Type 2 diabetes mellitus without complications: Secondary | ICD-10-CM | POA: Insufficient documentation

## 2015-01-27 DIAGNOSIS — M7501 Adhesive capsulitis of right shoulder: Secondary | ICD-10-CM | POA: Insufficient documentation

## 2015-01-27 DIAGNOSIS — M13811 Other specified arthritis, right shoulder: Secondary | ICD-10-CM | POA: Diagnosis not present

## 2015-01-27 DIAGNOSIS — Z794 Long term (current) use of insulin: Secondary | ICD-10-CM | POA: Insufficient documentation

## 2015-01-27 HISTORY — PX: CARPAL TUNNEL RELEASE: SHX101

## 2015-01-27 HISTORY — PX: SHOULDER ARTHROSCOPY WITH DISTAL CLAVICLE RESECTION: SHX5675

## 2015-01-27 LAB — BASIC METABOLIC PANEL
Anion gap: 8 (ref 5–15)
BUN: 14 mg/dL (ref 6–23)
CO2: 25 mmol/L (ref 19–32)
Calcium: 9.8 mg/dL (ref 8.4–10.5)
Chloride: 105 mmol/L (ref 96–112)
Creatinine, Ser: 0.78 mg/dL (ref 0.50–1.35)
GLUCOSE: 165 mg/dL — AB (ref 70–99)
POTASSIUM: 3.9 mmol/L (ref 3.5–5.1)
Sodium: 138 mmol/L (ref 135–145)

## 2015-01-27 LAB — CBC
HCT: 41.1 % (ref 39.0–52.0)
HEMOGLOBIN: 14.9 g/dL (ref 13.0–17.0)
MCH: 31.6 pg (ref 26.0–34.0)
MCHC: 36.3 g/dL — AB (ref 30.0–36.0)
MCV: 87.1 fL (ref 78.0–100.0)
Platelets: 170 10*3/uL (ref 150–400)
RBC: 4.72 MIL/uL (ref 4.22–5.81)
RDW: 13 % (ref 11.5–15.5)
WBC: 6.1 10*3/uL (ref 4.0–10.5)

## 2015-01-27 LAB — GLUCOSE, CAPILLARY
GLUCOSE-CAPILLARY: 161 mg/dL — AB (ref 70–99)
GLUCOSE-CAPILLARY: 344 mg/dL — AB (ref 70–99)
GLUCOSE-CAPILLARY: 327 mg/dL — AB (ref 70–99)
Glucose-Capillary: 363 mg/dL — ABNORMAL HIGH (ref 70–99)

## 2015-01-27 SURGERY — SHOULDER ARTHROSCOPY WITH DISTAL CLAVICLE RESECTION
Anesthesia: General | Site: Wrist | Laterality: Right

## 2015-01-27 MED ORDER — 0.9 % SODIUM CHLORIDE (POUR BTL) OPTIME
TOPICAL | Status: DC | PRN
  Administered 2015-01-27: 1000 mL

## 2015-01-27 MED ORDER — FENTANYL CITRATE 0.05 MG/ML IJ SOLN
INTRAMUSCULAR | Status: AC
  Filled 2015-01-27: qty 5

## 2015-01-27 MED ORDER — NEOSTIGMINE METHYLSULFATE 10 MG/10ML IV SOLN
INTRAVENOUS | Status: AC
  Filled 2015-01-27: qty 1

## 2015-01-27 MED ORDER — HYDROMORPHONE HCL 1 MG/ML IJ SOLN
INTRAMUSCULAR | Status: AC
  Administered 2015-01-27: 0.5 mg via INTRAVENOUS
  Filled 2015-01-27: qty 1

## 2015-01-27 MED ORDER — EPINEPHRINE HCL 1 MG/ML IJ SOLN
INTRAMUSCULAR | Status: AC
  Filled 2015-01-27: qty 1

## 2015-01-27 MED ORDER — ROCURONIUM BROMIDE 50 MG/5ML IV SOLN
INTRAVENOUS | Status: AC
  Filled 2015-01-27: qty 1

## 2015-01-27 MED ORDER — GLYCOPYRROLATE 0.2 MG/ML IJ SOLN
INTRAMUSCULAR | Status: DC | PRN
  Administered 2015-01-27: 0.6 mg via INTRAVENOUS

## 2015-01-27 MED ORDER — SODIUM CHLORIDE 0.9 % IR SOLN
Status: DC | PRN
  Administered 2015-01-27 (×2): 3000 mL

## 2015-01-27 MED ORDER — ONDANSETRON HCL 4 MG/2ML IJ SOLN
INTRAMUSCULAR | Status: AC
  Filled 2015-01-27: qty 2

## 2015-01-27 MED ORDER — LIDOCAINE HCL (CARDIAC) 20 MG/ML IV SOLN
INTRAVENOUS | Status: AC
  Filled 2015-01-27: qty 5

## 2015-01-27 MED ORDER — ROPIVACAINE HCL 5 MG/ML IJ SOLN
INTRAMUSCULAR | Status: DC | PRN
  Administered 2015-01-27: 25 mL via PERINEURAL

## 2015-01-27 MED ORDER — LACTATED RINGERS IV SOLN
INTRAVENOUS | Status: DC | PRN
  Administered 2015-01-27 (×2): via INTRAVENOUS

## 2015-01-27 MED ORDER — METOCLOPRAMIDE HCL 5 MG/ML IJ SOLN
INTRAMUSCULAR | Status: DC | PRN
  Administered 2015-01-27: 10 mg via INTRAVENOUS

## 2015-01-27 MED ORDER — BUPIVACAINE HCL (PF) 0.25 % IJ SOLN
INTRAMUSCULAR | Status: AC
  Filled 2015-01-27: qty 30

## 2015-01-27 MED ORDER — VECURONIUM BROMIDE 10 MG IV SOLR
INTRAVENOUS | Status: DC | PRN
  Administered 2015-01-27: 6 mg via INTRAVENOUS
  Administered 2015-01-27: 2 mg via INTRAVENOUS

## 2015-01-27 MED ORDER — KETOROLAC TROMETHAMINE 30 MG/ML IJ SOLN
INTRAMUSCULAR | Status: AC
  Filled 2015-01-27: qty 1

## 2015-01-27 MED ORDER — INSULIN ASPART 100 UNIT/ML ~~LOC~~ SOLN
20.0000 [IU] | Freq: Once | SUBCUTANEOUS | Status: AC
  Administered 2015-01-27: 20 [IU] via SUBCUTANEOUS

## 2015-01-27 MED ORDER — ONDANSETRON HCL 4 MG/2ML IJ SOLN
INTRAMUSCULAR | Status: AC
  Filled 2015-01-27: qty 6

## 2015-01-27 MED ORDER — FENTANYL CITRATE 0.05 MG/ML IJ SOLN
INTRAMUSCULAR | Status: DC | PRN
  Administered 2015-01-27: 50 ug via INTRAVENOUS
  Administered 2015-01-27: 100 ug via INTRAVENOUS
  Administered 2015-01-27 (×3): 50 ug via INTRAVENOUS
  Administered 2015-01-27 (×2): 100 ug via INTRAVENOUS

## 2015-01-27 MED ORDER — SUCCINYLCHOLINE CHLORIDE 20 MG/ML IJ SOLN
INTRAMUSCULAR | Status: DC | PRN
  Administered 2015-01-27: 120 mg via INTRAVENOUS

## 2015-01-27 MED ORDER — CEFAZOLIN SODIUM-DEXTROSE 2-3 GM-% IV SOLR
INTRAVENOUS | Status: AC
  Administered 2015-01-27: 2 g via INTRAVENOUS
  Filled 2015-01-27: qty 50

## 2015-01-27 MED ORDER — NEOSTIGMINE METHYLSULFATE 10 MG/10ML IV SOLN
INTRAVENOUS | Status: DC | PRN
  Administered 2015-01-27: 5 mg via INTRAVENOUS

## 2015-01-27 MED ORDER — ARTIFICIAL TEARS OP OINT
TOPICAL_OINTMENT | OPHTHALMIC | Status: DC | PRN
  Administered 2015-01-27: 1 via OPHTHALMIC

## 2015-01-27 MED ORDER — HYDROMORPHONE HCL 1 MG/ML IJ SOLN
0.2500 mg | INTRAMUSCULAR | Status: DC | PRN
  Administered 2015-01-27 (×3): 0.5 mg via INTRAVENOUS

## 2015-01-27 MED ORDER — SODIUM CHLORIDE 0.9 % IJ SOLN
INTRAMUSCULAR | Status: DC | PRN
  Administered 2015-01-27 (×5): 10 mL via INTRAVENOUS

## 2015-01-27 MED ORDER — SODIUM CHLORIDE 0.9 % IV SOLN
10.0000 mg | INTRAVENOUS | Status: DC | PRN
  Administered 2015-01-27: 15 ug/min via INTRAVENOUS

## 2015-01-27 MED ORDER — OXYCODONE-ACETAMINOPHEN 10-325 MG PO TABS: 1.0000 | ORAL_TABLET | Freq: Four times a day (QID) | ORAL | Status: DC | PRN

## 2015-01-27 MED ORDER — MIDAZOLAM HCL 2 MG/2ML IJ SOLN
INTRAMUSCULAR | Status: AC
  Filled 2015-01-27: qty 2

## 2015-01-27 MED ORDER — EPINEPHRINE HCL 1 MG/ML IJ SOLN
INTRAMUSCULAR | Status: DC | PRN
  Administered 2015-01-27: .1 mL

## 2015-01-27 MED ORDER — INSULIN ASPART 100 UNIT/ML ~~LOC~~ SOLN
SUBCUTANEOUS | Status: AC
Start: 2015-01-27 — End: 2015-01-27
  Administered 2015-01-27: 20 [IU] via SUBCUTANEOUS
  Filled 2015-01-27: qty 20

## 2015-01-27 MED ORDER — ARTIFICIAL TEARS OP OINT
TOPICAL_OINTMENT | OPHTHALMIC | Status: AC
  Filled 2015-01-27: qty 3.5

## 2015-01-27 MED ORDER — LIDOCAINE HCL (CARDIAC) 20 MG/ML IV SOLN
INTRAVENOUS | Status: DC | PRN
  Administered 2015-01-27: 90 mg via INTRAVENOUS
  Administered 2015-01-27: 50 mg via INTRAVENOUS

## 2015-01-27 MED ORDER — MIDAZOLAM HCL 5 MG/5ML IJ SOLN
INTRAMUSCULAR | Status: DC | PRN
  Administered 2015-01-27: 2 mg via INTRAVENOUS

## 2015-01-27 MED ORDER — EPHEDRINE SULFATE 50 MG/ML IJ SOLN
INTRAMUSCULAR | Status: AC
  Filled 2015-01-27: qty 1

## 2015-01-27 MED ORDER — SUCCINYLCHOLINE CHLORIDE 20 MG/ML IJ SOLN
INTRAMUSCULAR | Status: AC
  Filled 2015-01-27: qty 1

## 2015-01-27 MED ORDER — ONDANSETRON HCL 4 MG/2ML IJ SOLN
INTRAMUSCULAR | Status: DC | PRN
  Administered 2015-01-27 (×2): 4 mg via INTRAVENOUS

## 2015-01-27 MED ORDER — DEXAMETHASONE SODIUM PHOSPHATE 4 MG/ML IJ SOLN
INTRAMUSCULAR | Status: AC
  Filled 2015-01-27: qty 2

## 2015-01-27 MED ORDER — PHENYLEPHRINE 40 MCG/ML (10ML) SYRINGE FOR IV PUSH (FOR BLOOD PRESSURE SUPPORT)
PREFILLED_SYRINGE | INTRAVENOUS | Status: AC
  Filled 2015-01-27: qty 20

## 2015-01-27 MED ORDER — KETOROLAC TROMETHAMINE 30 MG/ML IJ SOLN
INTRAMUSCULAR | Status: DC | PRN
  Administered 2015-01-27: 30 mg via INTRAVENOUS

## 2015-01-27 MED ORDER — PROPOFOL 10 MG/ML IV BOLUS
INTRAVENOUS | Status: DC | PRN
  Administered 2015-01-27: 200 mg via INTRAVENOUS

## 2015-01-27 MED ORDER — BUPIVACAINE HCL (PF) 0.25 % IJ SOLN
INTRAMUSCULAR | Status: DC | PRN
  Administered 2015-01-27: 30 mL

## 2015-01-27 SURGICAL SUPPLY — 99 items
BANDAGE ELASTIC 3 VELCRO ST LF (GAUZE/BANDAGES/DRESSINGS) ×3 IMPLANT
BANDAGE ELASTIC 4 VELCRO ST LF (GAUZE/BANDAGES/DRESSINGS) ×3 IMPLANT
BENZOIN TINCTURE PRP APPL 2/3 (GAUZE/BANDAGES/DRESSINGS) ×3 IMPLANT
BIT DRILL TAK (DRILL) IMPLANT
BLADE CUDA 5.5 (BLADE) IMPLANT
BLADE CUTTER GATOR 3.5 (BLADE) ×3 IMPLANT
BLADE GREAT WHITE 4.2 (BLADE) ×3 IMPLANT
BLADE SURG 11 STRL SS (BLADE) ×3 IMPLANT
BNDG ESMARK 4X9 LF (GAUZE/BANDAGES/DRESSINGS) IMPLANT
BNDG GAUZE ELAST 4 BULKY (GAUZE/BANDAGES/DRESSINGS) ×3 IMPLANT
BUR GATOR 2.9 (BURR) IMPLANT
BUR OVAL 4.0 (BURR) ×3 IMPLANT
BUR OVAL 6.0 (BURR) ×3 IMPLANT
CANNULA SHOULDER 7CM (CANNULA) IMPLANT
CARTRIDGE CURVETEK MED (MISCELLANEOUS) IMPLANT
CARTRIDGE CURVETEK XLRG (MISCELLANEOUS) IMPLANT
CORDS BIPOLAR (ELECTRODE) ×3 IMPLANT
COVER SURGICAL LIGHT HANDLE (MISCELLANEOUS) ×3 IMPLANT
CUFF TOURNIQUET SINGLE 18IN (TOURNIQUET CUFF) ×3 IMPLANT
CUFF TOURNIQUET SINGLE 24IN (TOURNIQUET CUFF) IMPLANT
DRAPE INCISE IOBAN 66X45 STRL (DRAPES) ×9 IMPLANT
DRAPE STERI 35X30 U-POUCH (DRAPES) ×3 IMPLANT
DRAPE SURG 17X23 STRL (DRAPES) ×3 IMPLANT
DRAPE U-SHAPE 47X51 STRL (DRAPES) ×3 IMPLANT
DRILL TAK (DRILL)
DRSG AQUACEL AG ADV 3.5X10 (GAUZE/BANDAGES/DRESSINGS) ×3 IMPLANT
DRSG PAD ABDOMINAL 8X10 ST (GAUZE/BANDAGES/DRESSINGS) ×9 IMPLANT
DURAPREP 26ML APPLICATOR (WOUND CARE) ×6 IMPLANT
ELECT MENISCUS 165MM 90D (ELECTRODE) IMPLANT
ELECT REM PT RETURN 9FT ADLT (ELECTROSURGICAL) ×3
ELECTRODE REM PT RTRN 9FT ADLT (ELECTROSURGICAL) ×2 IMPLANT
FILTER STRAW FLUID ASPIR (MISCELLANEOUS) ×3 IMPLANT
GAUZE SPONGE 4X4 12PLY STRL (GAUZE/BANDAGES/DRESSINGS) ×3 IMPLANT
GAUZE XEROFORM 1X8 LF (GAUZE/BANDAGES/DRESSINGS) ×3 IMPLANT
GLOVE BIO SURGEON STRL SZ7.5 (GLOVE) ×3 IMPLANT
GLOVE BIO SURGEON STRL SZ8 (GLOVE) ×3 IMPLANT
GLOVE BIOGEL PI IND STRL 6.5 (GLOVE) ×2 IMPLANT
GLOVE BIOGEL PI IND STRL 7.5 (GLOVE) ×2 IMPLANT
GLOVE BIOGEL PI IND STRL 8 (GLOVE) ×2 IMPLANT
GLOVE BIOGEL PI INDICATOR 6.5 (GLOVE) ×1
GLOVE BIOGEL PI INDICATOR 7.5 (GLOVE) ×1
GLOVE BIOGEL PI INDICATOR 8 (GLOVE) ×1
GLOVE ECLIPSE 7.0 STRL STRAW (GLOVE) ×3 IMPLANT
GLOVE SURG ORTHO 8.0 STRL STRW (GLOVE) ×3 IMPLANT
GLOVE SURG SS PI 6.5 STRL IVOR (GLOVE) ×3 IMPLANT
GOWN STRL REUS W/ TWL LRG LVL3 (GOWN DISPOSABLE) ×4 IMPLANT
GOWN STRL REUS W/ TWL XL LVL3 (GOWN DISPOSABLE) ×4 IMPLANT
GOWN STRL REUS W/TWL LRG LVL3 (GOWN DISPOSABLE) ×2
GOWN STRL REUS W/TWL XL LVL3 (GOWN DISPOSABLE) ×2
KIT BASIN OR (CUSTOM PROCEDURE TRAY) ×3 IMPLANT
KIT ROOM TURNOVER OR (KITS) ×3 IMPLANT
LOOP VESSEL MAXI BLUE (MISCELLANEOUS) IMPLANT
MANIFOLD NEPTUNE II (INSTRUMENTS) ×3 IMPLANT
NDL SUT 6 .5 CRC .975X.05 MAYO (NEEDLE) ×2 IMPLANT
NEEDLE HYPO 25GX1X1/2 BEV (NEEDLE) ×3 IMPLANT
NEEDLE HYPO 25X1 1.5 SAFETY (NEEDLE) ×3 IMPLANT
NEEDLE MAYO TAPER (NEEDLE) ×1
NEEDLE SPNL 18GX3.5 QUINCKE PK (NEEDLE) ×6 IMPLANT
NS IRRIG 1000ML POUR BTL (IV SOLUTION) ×3 IMPLANT
PACK ORTHO EXTREMITY (CUSTOM PROCEDURE TRAY) ×3 IMPLANT
PACK SHOULDER (CUSTOM PROCEDURE TRAY) ×3 IMPLANT
PAD ARMBOARD 7.5X6 YLW CONV (MISCELLANEOUS) ×6 IMPLANT
PAD CAST 3X4 CTTN HI CHSV (CAST SUPPLIES) ×2 IMPLANT
PAD CAST 4YDX4 CTTN HI CHSV (CAST SUPPLIES) ×4 IMPLANT
PADDING CAST COTTON 3X4 STRL (CAST SUPPLIES) ×1
PADDING CAST COTTON 4X4 STRL (CAST SUPPLIES) ×2
SET ARTHROSCOPY TUBING (MISCELLANEOUS) ×1
SET ARTHROSCOPY TUBING LN (MISCELLANEOUS) ×2 IMPLANT
SLING ARM IMMOBILIZER MED (SOFTGOODS) ×3 IMPLANT
SPEAR FASTAKII (SLEEVE) IMPLANT
SPLINT PLASTER EXTRA FAST 3X15 (CAST SUPPLIES) ×1
SPLINT PLASTER GYPS XFAST 3X15 (CAST SUPPLIES) ×2 IMPLANT
SPONGE GAUZE 4X4 12PLY STER LF (GAUZE/BANDAGES/DRESSINGS) ×3 IMPLANT
SPONGE LAP 4X18 X RAY DECT (DISPOSABLE) ×6 IMPLANT
STRIP CLOSURE SKIN 1/2X4 (GAUZE/BANDAGES/DRESSINGS) ×3 IMPLANT
SUCTION FRAZIER TIP 10 FR DISP (SUCTIONS) ×3 IMPLANT
SUT ETHILON 3 0 PS 1 (SUTURE) ×12 IMPLANT
SUT FIBERWIRE 2-0 18 17.9 3/8 (SUTURE)
SUT PROLENE 3 0 PS 2 (SUTURE) ×3 IMPLANT
SUT VIC AB 0 CT1 27 (SUTURE) ×2
SUT VIC AB 0 CT1 27XBRD ANBCTR (SUTURE) ×4 IMPLANT
SUT VIC AB 1 CT1 27 (SUTURE) ×1
SUT VIC AB 1 CT1 27XBRD ANBCTR (SUTURE) ×2 IMPLANT
SUT VIC AB 2-0 CT1 27 (SUTURE) ×1
SUT VIC AB 2-0 CT1 TAPERPNT 27 (SUTURE) ×2 IMPLANT
SUT VIC AB 3-0 FS2 27 (SUTURE) IMPLANT
SUT VICRYL 0 UR6 27IN ABS (SUTURE) IMPLANT
SUTURE FIBERWR 2-0 18 17.9 3/8 (SUTURE) IMPLANT
SYR 20CC LL (SYRINGE) ×6 IMPLANT
SYR 3ML LL SCALE MARK (SYRINGE) ×3 IMPLANT
SYR CONTROL 10ML LL (SYRINGE) ×3 IMPLANT
SYR TB 1ML LUER SLIP (SYRINGE) ×3 IMPLANT
SYSTEM CHEST DRAIN TLS 7FR (DRAIN) IMPLANT
TOWEL OR 17X24 6PK STRL BLUE (TOWEL DISPOSABLE) ×3 IMPLANT
TOWEL OR 17X26 10 PK STRL BLUE (TOWEL DISPOSABLE) ×3 IMPLANT
TUBE CONNECTING 12X1/4 (SUCTIONS) IMPLANT
UNDERPAD 30X30 INCONTINENT (UNDERPADS AND DIAPERS) ×3 IMPLANT
WAND HAND CNTRL MULTIVAC 90 (MISCELLANEOUS) ×3 IMPLANT
WATER STERILE IRR 1000ML POUR (IV SOLUTION) ×6 IMPLANT
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
## 2015-01-27 NOTE — OR Nursing (Signed)
CBG recheck 363 and results passed to Dr. Kalman Shan.  Pt's insulin pump given to him and he placed it and programmed it himself.  Dr. Kalman Shan aware and wants pump to regulate pts blood sugar.

## 2015-01-27 NOTE — Anesthesia Preprocedure Evaluation (Signed)
Anesthesia Evaluation  Patient identified by MRN, date of birth, ID band Patient awake    Reviewed: Allergy & Precautions, NPO status , Patient's Chart, lab work & pertinent test results  Airway Mallampati: II  TM Distance: >3 FB Neck ROM: Full    Dental no notable dental hx.    Pulmonary Current Smoker,  breath sounds clear to auscultation  Pulmonary exam normal       Cardiovascular hypertension, Rhythm:Regular Rate:Normal     Neuro/Psych negative neurological ROS  negative psych ROS   GI/Hepatic negative GI ROS, Neg liver ROS,   Endo/Other  negative endocrine ROSdiabetes  Renal/GU negative Renal ROS  negative genitourinary   Musculoskeletal negative musculoskeletal ROS (+)   Abdominal   Peds negative pediatric ROS (+)  Hematology negative hematology ROS (+)   Anesthesia Other Findings   Reproductive/Obstetrics negative OB ROS                             Anesthesia Physical Anesthesia Plan  ASA: III  Anesthesia Plan: General   Post-op Pain Management:    Induction: Intravenous  Airway Management Planned: Oral ETT  Additional Equipment:   Intra-op Plan:   Post-operative Plan: Extubation in OR  Informed Consent: I have reviewed the patients History and Physical, chart, labs and discussed the procedure including the risks, benefits and alternatives for the proposed anesthesia with the patient or authorized representative who has indicated his/her understanding and acceptance.   Dental advisory given  Plan Discussed with: CRNA and Surgeon  Anesthesia Plan Comments:         Anesthesia Quick Evaluation

## 2015-01-27 NOTE — Discharge Instructions (Signed)
°What to eat: ° °For your first meals, you should eat lightly; only small meals initially.  If you do not have nausea, you may eat larger meals.  Avoid spicy, greasy and heavy food.   ° °General Anesthesia, Adult, Care After  °Refer to this sheet in the next few weeks. These instructions provide you with information on caring for yourself after your procedure. Your health care provider may also give you more specific instructions. Your treatment has been planned according to current medical practices, but problems sometimes occur. Call your health care provider if you have any problems or questions after your procedure.  °WHAT TO EXPECT AFTER THE PROCEDURE  °After the procedure, it is typical to experience:  °Sleepiness.  °Nausea and vomiting. °HOME CARE INSTRUCTIONS  °For the first 24 hours after general anesthesia:  °Have a responsible person with you.  °Do not drive a car. If you are alone, do not take public transportation.  °Do not drink alcohol.  °Do not take medicine that has not been prescribed by your health care provider.  °Do not sign important papers or make important decisions.  °You may resume a normal diet and activities as directed by your health care provider.  °Change bandages (dressings) as directed.  °If you have questions or problems that seem related to general anesthesia, call the hospital and ask for the anesthetist or anesthesiologist on call. °SEEK MEDICAL CARE IF:  °You have nausea and vomiting that continue the day after anesthesia.  °You develop a rash. °SEEK IMMEDIATE MEDICAL CARE IF:  °You have difficulty breathing.  °You have chest pain.  °You have any allergic problems. °Document Released: 02/13/2001 Document Revised: 07/10/2013 Document Reviewed: 05/23/2013  °ExitCare® Patient Information ©2014 [employer].  ° °Sore Throat  ° ° °A sore throat is a painful, burning, sore, or scratchy feeling of the throat. There may be pain or tenderness when swallowing or talking. You may have  other symptoms with a sore throat. These include coughing, sneezing, fever, or a swollen neck. A sore throat is often the first sign of another sickness. These sicknesses may include a cold, flu, strep throat, or an infection called mono. Most sore throats go away without medical treatment.  °HOME CARE  °Only take medicine as told by your doctor.  °Drink enough fluids to keep your pee (urine) clear or pale yellow.  °Rest as needed.  °Try using throat sprays, lozenges, or suck on hard candy (if older than 4 years or as told).  °Sip warm liquids, such as broth, herbal tea, or warm water with honey. Try sucking on frozen ice pops or drinking cold liquids.  °Rinse the mouth (gargle) with salt water. Mix 1 teaspoon salt with 8 ounces of water.  °Do not smoke. Avoid being around others when they are smoking.  °Put a humidifier in your bedroom at night to moisten the air. You can also turn on a hot shower and sit in the bathroom for 5-10 minutes. Be sure the bathroom door is closed. °GET HELP RIGHT AWAY IF:  °You have trouble breathing.  °You cannot swallow fluids, soft foods, or your spit (saliva).  °You have more puffiness (swelling) in the throat.  °Your sore throat does not get better in 7 days.  °You feel sick to your stomach (nauseous) and throw up (vomit).  °You have a fever or lasting symptoms for more than 2-3 days.  °You have a fever and your symptoms suddenly get worse. °MAKE SURE YOU:  °Understand these   instructions.  °Will watch your condition.  °Will get help right away if you are not doing well or get worse. °Document Released: 08/16/2008 Document Revised: 08/01/2012 Document Reviewed: 07/15/2012  °ExitCare® Patient Information ©2015 [employer]. This information is not intended to replace advice given to you by your health care provider. Make sure you discuss any questions you have with your health care provider.  ° ° ° °

## 2015-01-27 NOTE — Anesthesia Postprocedure Evaluation (Signed)
  Anesthesia Post-op Note  Patient: [name]  Procedure(s) Performed: Procedure(s) (LRB): RIGHT SHOULDER ARTHROSCOPY WITH DISTAL CLAVICLE RESECTION  MANIPULATION UNDER ANESTHESIA.   (Right) RIGHT CARPAL TUNNEL RELEASE (Right)  Patient Location: PACU  Anesthesia Type: GA combined with regional for post-op pain  Level of Consciousness: awake and alert   Airway and Oxygen Therapy: Patient Spontanous Breathing  Post-op Pain: mild  Post-op Assessment: Post-op Vital signs reviewed, Patient's Cardiovascular Status Stable, Respiratory Function Stable, Patent Airway and No signs of Nausea or vomiting  Last Vitals:  Filed Vitals:   01/27/15 1145  BP: 124/85  Pulse: 73  Temp:   Resp: 21    Post-op Vital Signs: stable   Complications: No apparent anesthesia complications

## 2015-01-27 NOTE — Brief Op Note (Signed)
01/27/2015  10:24 AM  PATIENT:  [name]  44 y.o. male  PRE-OPERATIVE DIAGNOSIS:  RIGHT CARPAL TUNNEL SYNDROME, RIGHT SHOULDER BURSITIS AC JOINT arthritis  POST-OPERATIVE DIAGNOSIS:  RIGHT CARPAL TUNNEL SYNDROME, RIGHT SHOULDER BURSITIS AC JOINT arthritis  PROCEDURE:  Procedure(s): RIGHT SHOULDER ARTHROSCOPY WITH DISTAL CLAVICLE RESECTION  MANIPULATION UNDER ANESTHESIA debridement.   RIGHT CARPAL TUNNEL RELEASE  SURGEON:  Surgeon(s): Meredith Pel, MD  ASSISTANT: carla bethune rnfa  ANESTHESIA:   general  EBL: 10 ml    Total I/O In: 1700 [I.V.:1700] Out: 75 [Blood:75]  BLOOD ADMINISTERED: none  DRAINS: none   LOCAL MEDICATIONS USED:  none  SPECIMEN:  No Specimen  COUNTS:  YES  TOURNIQUET:   Total Tourniquet Time Documented: Upper Arm (Right) - 12 minutes Total: Upper Arm (Right) - 12 minutes   DICTATION: .Other Dictation: Dictation Number [id number]  PLAN OF CARE: Discharge to home after PACU  PATIENT DISPOSITION:  PACU - hemodynamically stable

## 2015-01-27 NOTE — OR Nursing (Signed)
CBG 327 with pt managing his own insulin pump and after our 20 units of Novolog earlier.  Pt has basal rate going.  Dr. Kalman Shan aware and stated pt is ok for discharge.  Pt moved to Phase II and care given to [facility].

## 2015-01-27 NOTE — Transfer of Care (Signed)
Immediate Anesthesia Transfer of Care Note  Patient: [name]  Procedure(s) Performed: Procedure(s): RIGHT SHOULDER ARTHROSCOPY WITH DISTAL CLAVICLE RESECTION  MANIPULATION UNDER ANESTHESIA.   (Right) RIGHT CARPAL TUNNEL RELEASE (Right)  Patient Location: PACU  Anesthesia Type:GA combined with regional for post-op pain  Level of Consciousness: oriented, sedated, patient cooperative and responds to stimulation  Airway & Oxygen Therapy: Patient Spontanous Breathing and Patient connected to nasal cannula oxygen  Post-op Assessment: Report given to RN, Post -op Vital signs reviewed and stable, Patient moving all extremities and Patient moving all extremities X 4  Post vital signs: Reviewed and stable  Last Vitals:  Filed Vitals:   01/27/15 0634  BP: 150/97  Pulse: 85  Temp: 36.2 C  Resp: 20    Complications: No apparent anesthesia complications

## 2015-01-27 NOTE — Progress Notes (Signed)
Spoke with Dr Kalman Shan about insulin pump, states it is ok to leave out for now. Pt has already removed pump. CBG 160

## 2015-01-27 NOTE — Anesthesia Procedure Notes (Addendum)
Anesthesia Regional Block:  Interscalene brachial plexus block  Pre-Anesthetic Checklist: ,, timeout performed, Correct Patient, Correct Site, Correct Laterality, Correct Procedure, Correct Position, site marked, Risks and benefits discussed,  Surgical consent,  Pre-op evaluation,  At surgeon's request and post-op pain management  Laterality: Right  Prep: chloraprep       Needles:  Injection technique: Single-shot  Needle Type: Echogenic Stimulator Needle     Needle Length: 9cm 9 cm Needle Gauge: 21 and 21 G    Additional Needles:  Procedures: ultrasound guided (picture in chart) Interscalene brachial plexus block Narrative:  Injection made incrementally with aspirations every 5 mL.  Performed by: Personally  Anesthesiologist: ROSE, Iona Beard  Additional Notes: Patient tolerated the procedure well without complications   Procedure Name: Intubation Date/Time: 01/27/2015 8:10 AM Performed by: Jacquiline Doe A Pre-anesthesia Checklist: Patient identified, Timeout performed, Emergency Drugs available, Suction available and Patient being monitored Patient Re-evaluated:Patient Re-evaluated prior to inductionOxygen Delivery Method: Circle system utilized Preoxygenation: Pre-oxygenation with 100% oxygen Intubation Type: IV induction and Cricoid Pressure applied Ventilation: Mask ventilation without difficulty and Oral airway inserted - appropriate to patient size Laryngoscope Size: Mac and 4 Grade View: Grade I Tube type: Oral Tube size: 7.5 mm Number of attempts: 1 Airway Equipment and Method: Stylet Placement Confirmation: ETT inserted through vocal cords under direct vision,  breath sounds checked- equal and bilateral and positive ETCO2 Secured at: 24 cm Tube secured with: Tape Dental Injury: Teeth and Oropharynx as per pre-operative assessment

## 2015-01-28 ENCOUNTER — Other Ambulatory Visit: Payer: Self-pay | Admitting: Endocrinology

## 2015-01-28 NOTE — Op Note (Signed)
NAME:  KEIN, CARLBERG NO.:  [id number]  MEDICAL RECORD NO.:  [id number]  LOCATION:  [facility]                         FACILITY:  [facility]  PHYSICIAN:  Anderson Malta, M.D.    DATE OF BIRTH:  February 04, 1971  DATE OF PROCEDURE:  01/27/2015 DATE OF DISCHARGE:  01/27/2015                              OPERATIVE REPORT   PREOPERATIVE DIAGNOSES:  Right frozen shoulder, right acromioclavicular joint arthritis, right carpal tunnel syndrome.  POSTOPERATIVE DIAGNOSES:  Early right frozen shoulder, right shoulder acromioclavicular joint arthritis, and right carpal tunnel syndrome.  PROCEDURES:  Right shoulder manipulation under anesthesia, arthroscopy with debridement, rotator interval release, arthroscopic distal clavicle excision, and open right carpal tunnel release.  SURGEON:  Anderson Malta, M.D.  ASSISTANT:  Laure Kidney, RNFA.  INDICATIONS:  Morrie is a patient with right shoulder pain and right carpal tunnel syndrome refractory to nonoperative management, presents for operative management after explanation of risks and benefits.  PROCEDURE IN DETAIL:  The patient was brought to the operating room where general anesthetic was induced.  Preop antibiotics were administered.  Time-out was called.  Left shoulder was examined under anesthesia, found to have external rotation of 60 degrees, forward flexion 180, and isolated glenohumeral abduction about 110.  Right shoulder examined under anesthesia, found to have external rotation about 35 to 40 degrees, isolated glenohumeral abduction to about 75, and isolated forward flexion to about 135.  Right arm was manipulated with very little give of the tissue.  This was done with forward flexion isolated glenohumeral abduction external rotation.  The patient was then placed with his head in neutral position.  Right arm shoulder and hand prescrubbed with alcohol and Betadine, allowed to air dry, prepped with DuraPrep solution, draped in  a sterile manner.  Head in neutral position in the beach chair positioner.  After sterile prepping and draping, solution of saline with epinephrine injected in the subacromial space. Posterior portal was created 2 cm medial and inferior to the posterolateral margin of the acromion.  Diagnostic arthroscopy was performed.  Anterior portal created under direct visualization.  The patient did have a synovitis in the rotator interval.  Rotator cuff was intact.  Biceps anchor was intact.  Glenohumeral surfaces were intact. Rotator interval release was performed.  Synovitis near the biceps stump was debrided with the ArthroCare wand.  At this time, the scope was placed into the subacromial space.  Bursectomy performed.  Arthroscopic distal clavicle was then excised approximately 8 to 9 mm in line with the distal clavicle joint.  Superior and posterior ligaments were maintained.  Visualizing from the front, the arm was taken intercross from adduction.  No impingement noted at the [facility] joint.  At this time, shoulder joint thoroughly irrigated.  Instruments were removed.  Portals closed using 3-0 nylon.  Attention directed towards the hand.  The proximal arm tourniquet was utilized for approximately 12 minutes at 250 mmHg.  Hand was elevated, exsanguinated with Esmarch wrap.  Tourniquet was inflated.  Incision was made at the intersection of Kaplan cardinal line and the radial border of the fourth finger down to the proximal wrist flexion crease.  Skin and subcutaneous tissue  were sharply divided.  Bleeding points were encountered, controlled using bipolar electrocautery.  Transverse carpal ligament was identified and divided in its midsection, 2 to 3 mm.  Right angle retractor was then placed between the transverse carpal ligament and the nerve.  Release was performed distally and proximally under direct visualization to the forearm fascia.  Motor branch intact.  Thorough irrigation performed. Skin  edges were anesthetized with Marcaine.  Tourniquet released. Bleeding points were encountered and controlled using electrocautery. The nerve itself had a very reddened appearance.  Following release, the incision was closed using 3-0 nylon simple sutures.  Bulky splint was applied.  The patient was placed in a sling, tolerated the procedure well without immediate complications.  Transferred to the recovery room in stable condition.     Anderson Malta, M.D.     GSD/MEDQ  D:  01/27/2015  T:  01/28/2015  Job:  [id number]

## 2015-01-29 ENCOUNTER — Encounter (HOSPITAL_COMMUNITY): Payer: Self-pay | Admitting: Orthopedic Surgery

## 2015-02-03 ENCOUNTER — Ambulatory Visit: Payer: Self-pay | Admitting: Family Medicine

## 2015-02-05 ENCOUNTER — Ambulatory Visit (INDEPENDENT_AMBULATORY_CARE_PROVIDER_SITE_OTHER): Payer: 59 | Admitting: Family Medicine

## 2015-02-05 VITALS — BP 150/90 | HR 92 | Temp 97.4°F | Resp 17 | Ht 70.5 in | Wt 209.2 lb

## 2015-02-05 DIAGNOSIS — M129 Arthropathy, unspecified: Secondary | ICD-10-CM | POA: Diagnosis not present

## 2015-02-05 DIAGNOSIS — M7501 Adhesive capsulitis of right shoulder: Secondary | ICD-10-CM

## 2015-02-05 DIAGNOSIS — M19011 Primary osteoarthritis, right shoulder: Secondary | ICD-10-CM

## 2015-02-05 DIAGNOSIS — E10620 Type 1 diabetes mellitus with diabetic dermatitis: Secondary | ICD-10-CM | POA: Diagnosis not present

## 2015-02-05 DIAGNOSIS — G5601 Carpal tunnel syndrome, right upper limb: Secondary | ICD-10-CM

## 2015-02-05 MED ORDER — OXYCODONE-ACETAMINOPHEN 10-325 MG PO TABS: 1.0000 | ORAL_TABLET | Freq: Four times a day (QID) | ORAL | Status: DC | PRN
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
## 2015-02-11 ENCOUNTER — Other Ambulatory Visit: Payer: Self-pay | Admitting: Family

## 2015-02-11 NOTE — Telephone Encounter (Signed)
Medication Detail      Disp Refills Start End     benazepril (LOTENSIN) 40 MG tablet 90 tablet 1 07/23/2014     Sig: TAKE 1 TABLET BY MOUTH DAILY    Patient taking differently: Take 40 mg by mouth daily        E-Prescribing Status: Receipt confirmed by pharmacy (07/23/2014 8:53 AM EDT)    Medication Detail      Disp Refills Start End     amLODipine (NORVASC) 10 MG tablet 90 tablet 1 07/23/2014     Sig: TAKE 1 TABLET BY MOUTH DAILY    Patient taking differently: Take 10 mg by mouth daily        E-Prescribing Status: Receipt confirmed by pharmacy (07/23/2014 8:53 AM EDT)    Rx request to pharmacy/SLS

## 2015-02-17 ENCOUNTER — Ambulatory Visit (INDEPENDENT_AMBULATORY_CARE_PROVIDER_SITE_OTHER): Payer: Self-pay | Admitting: Family Medicine

## 2015-02-17 VITALS — BP 146/95 | HR 93 | Ht 70.0 in | Wt 212.2 lb

## 2015-02-17 DIAGNOSIS — E108 Type 1 diabetes mellitus with unspecified complications: Secondary | ICD-10-CM

## 2015-02-17 NOTE — Progress Notes (Signed)
Patient presents today for DM follow-up as part of employer-sponsored [employer] to [employer]. Medications, glucose readings, a1c and compliance have been reviewed. I have also discussed with patient lifestyle interventions including diet and exercise. Patient has set a series of personal goals and will follow-up in no more than 3 months for further review of DM.  A: Mr. [name] has improved since our last visit in regards to his trust of the pump. He has started to input carbohydrates slightly more reliably and seems a bit more prepared to trust the results and let the bolus wizard do the work rather than taking his own guess at an appropriate bolus. He has recently recovered from multiple surgeries and has recovered faster than previous. I have reinforced that this is due to his relatively improved blood glucose values (compared to previous surgeries). He is well overdue for a dilated eye exam and he attributes this to lack of transportation. I have again reemphasized the importance. Dr. Dwyane Dee continues to follow his pump. I have recommended Mr. [name] contact Dr. Dwyane Dee to consider slightly more aggressive basal rates given the consistent hyperglycemia and infrequent hypo's. The few hypos are typically rebound manual overcorrections.  P:  1) Need a Dilated Eye Exam ASAP! 2) Get a food exam at Dr. Randel Moses 3) Increase exercise as able. Increase intensity from casual stroll to increased heart rate. Target is at least 150 mins/week. Goal before next visit is to get to 20 minutes at least 3 times per week of INCREASED heart rate and INCREASED breathing rate.

## 2015-02-17 NOTE — Patient Instructions (Signed)
1) Need a Dilated Eye Exam ASAP!  2) Get a food exam at Dr. Randel Pigg  3) Increase exercise as able. Increase intensity from casual stroll to increased heart rate. Target is at least 150 mins/week. Goal before next visit is to get to 20 minutes at least 3 times per week of INCREASED heart rate and INCREASED breathing rate.

## 2015-02-23 NOTE — Progress Notes (Signed)
Patient ID: [name], male   DOB: 05/03/1971, 44 y.o.   MRN: [id number] Reviewed: Agree with the documentation and management of our [place].

## 2015-03-03 ENCOUNTER — Telehealth: Payer: Self-pay | Admitting: Endocrinology

## 2015-03-03 NOTE — Telephone Encounter (Signed)
Pt needs new rx for his insulin novolog the dosage needs to be increased he is currently taking 140-150 daily

## 2015-03-03 NOTE — Telephone Encounter (Signed)
ok 

## 2015-03-04 MED ORDER — INSULIN ASPART 100 UNIT/ML ~~LOC~~ SOLN: 140.0000 [IU] | Freq: Every day | SUBCUTANEOUS | Status: DC

## 2015-03-04 NOTE — Telephone Encounter (Signed)
erx sent to [facility]

## 2015-03-11 ENCOUNTER — Ambulatory Visit: Payer: 59 | Admitting: Endocrinology

## 2015-03-17 ENCOUNTER — Other Ambulatory Visit: Payer: Self-pay | Admitting: Family Medicine

## 2015-03-17 NOTE — Telephone Encounter (Signed)
Last refill 01/27/15

## 2015-03-17 NOTE — Telephone Encounter (Signed)
Pt requesting a refill traMADol (ULTRAM) 50 MG tablet to hold him over until May appointment

## 2015-03-17 NOTE — Telephone Encounter (Signed)
Faxed hardcopy refills for Diazepam #60 with 2 refills and Tramadol #120 with 0 refills to [facility].

## 2015-03-18 LAB — HM DIABETES EYE EXAM

## 2015-03-23 ENCOUNTER — Encounter: Payer: Self-pay | Admitting: Family Medicine

## 2015-03-25 ENCOUNTER — Ambulatory Visit: Payer: Self-pay | Admitting: Pharmacist

## 2015-03-26 ENCOUNTER — Ambulatory Visit (INDEPENDENT_AMBULATORY_CARE_PROVIDER_SITE_OTHER): Payer: Self-pay | Admitting: Family Medicine

## 2015-03-26 VITALS — BP 142/100 | HR 88 | Ht 70.0 in | Wt 210.0 lb

## 2015-03-26 DIAGNOSIS — E1042 Type 1 diabetes mellitus with diabetic polyneuropathy: Secondary | ICD-10-CM

## 2015-03-26 LAB — POCT GLYCOSYLATED HEMOGLOBIN (HGB A1C): Hemoglobin A1C: 8.1

## 2015-03-26 NOTE — Patient Instructions (Signed)
Goals for next visit:  1. Reschedule with Endo, PCP and Dr. Marlou Sa. ASAP. 2. Get foot exam 3. Ask PCP or endo about a thyroid check. 4. Let me know when you're ready to quit smoking.

## 2015-03-26 NOTE — Progress Notes (Signed)
Subjective:  Patient presents today for 3 month diabetes follow-up as part of the employer-sponsored Link to Wellness program.  Current diabetes regimen includes Novolog via pump.  Patient also continues on daily ACE Inhibitor and statin.  Most recent MD follow-up was with endo on 12/10/14.  Patient has been instructed to make follow up appointments with endo and PCP.  Patient expresses concern over lack of follow-up on his thyroid nodule.  Assessment/Plan:  Patient is a 44 yo male with DM 1. Most recent A1C was 8.1% which is exceeding goal of less than 7%. Weight is increased from last visit with me.   Lifestyle improvements:  Physical Activity-  Improve exercise habits as ankle allows. Currently walking 30-40 minutes approximately 3 days per week. I have encouraged an increase in quality and a slight increase in quantity. I have encouraged him to walk fast enough that it becomes close to difficult to hold a conversation; he typically walks with his son.  Nutrition- Diet continues to be better than previous years, but still imperfect. He continues to have good weeks and bad weeks.  Follow up with me in 1 month.    Goals for Next Visit:  1. Reschedule with Endo, PCP and Dr. Marlou Sa. ASAP. 2. Get foot exam 3. Ask PCP or endo about a thyroid check. 4. Let me know when you're ready to quit smoking.

## 2015-04-02 NOTE — Progress Notes (Signed)
ATTENDING PHYSICIAN NOTE: I have reviewed the chart and agree with the plan as detailed above. Lenise Jr MD Pager [phone]  

## 2015-04-13 ENCOUNTER — Other Ambulatory Visit: Payer: Self-pay | Admitting: Endocrinology

## 2015-04-14 ENCOUNTER — Ambulatory Visit (INDEPENDENT_AMBULATORY_CARE_PROVIDER_SITE_OTHER): Payer: 59 | Admitting: Family Medicine

## 2015-04-14 ENCOUNTER — Encounter: Payer: Self-pay | Admitting: Family Medicine

## 2015-04-14 VITALS — BP 122/88 | HR 94 | Temp 97.7°F | Ht 70.0 in | Wt 213.0 lb

## 2015-04-14 DIAGNOSIS — E291 Testicular hypofunction: Secondary | ICD-10-CM

## 2015-04-14 DIAGNOSIS — R7989 Other specified abnormal findings of blood chemistry: Secondary | ICD-10-CM

## 2015-04-14 DIAGNOSIS — I10 Essential (primary) hypertension: Secondary | ICD-10-CM | POA: Diagnosis not present

## 2015-04-14 DIAGNOSIS — E041 Nontoxic single thyroid nodule: Secondary | ICD-10-CM

## 2015-04-14 DIAGNOSIS — E1041 Type 1 diabetes mellitus with diabetic mononeuropathy: Secondary | ICD-10-CM

## 2015-04-14 DIAGNOSIS — E049 Nontoxic goiter, unspecified: Secondary | ICD-10-CM

## 2015-04-14 DIAGNOSIS — E782 Mixed hyperlipidemia: Secondary | ICD-10-CM | POA: Diagnosis not present

## 2015-04-14 DIAGNOSIS — IMO0002 Reserved for concepts with insufficient information to code with codable children: Secondary | ICD-10-CM

## 2015-04-14 DIAGNOSIS — E1065 Type 1 diabetes mellitus with hyperglycemia: Secondary | ICD-10-CM

## 2015-04-14 DIAGNOSIS — M25511 Pain in right shoulder: Secondary | ICD-10-CM

## 2015-04-14 DIAGNOSIS — N529 Male erectile dysfunction, unspecified: Secondary | ICD-10-CM

## 2015-04-14 DIAGNOSIS — E785 Hyperlipidemia, unspecified: Secondary | ICD-10-CM

## 2015-04-14 DIAGNOSIS — E010 Iodine-deficiency related diffuse (endemic) goiter: Secondary | ICD-10-CM

## 2015-04-14 DIAGNOSIS — E1049 Type 1 diabetes mellitus with other diabetic neurological complication: Secondary | ICD-10-CM

## 2015-04-14 MED ORDER — BENAZEPRIL HCL 40 MG PO TABS: 40.0000 mg | ORAL_TABLET | Freq: Every day | ORAL | Status: DC

## 2015-04-14 MED ORDER — TRAMADOL HCL 50 MG PO TABS: 50.0000 mg | ORAL_TABLET | ORAL | Status: DC | PRN

## 2015-04-14 MED ORDER — AMLODIPINE BESYLATE 10 MG PO TABS: 10.0000 mg | ORAL_TABLET | Freq: Every day | ORAL | Status: DC

## 2015-04-14 MED ORDER — TADALAFIL 5 MG PO TABS: 5.0000 mg | ORAL_TABLET | Freq: Every day | ORAL | Status: DC

## 2015-04-14 NOTE — Patient Instructions (Signed)
Rel of rec Dr Tamala Julian endocrinology [facility] note and pathology and ultrasound results

## 2015-04-14 NOTE — Progress Notes (Signed)
Pre visit review using our clinic review tool, if applicable. No additional management support is needed unless otherwise documented below in the visit note. 

## 2015-04-16 ENCOUNTER — Other Ambulatory Visit: Payer: Self-pay

## 2015-04-17 ENCOUNTER — Other Ambulatory Visit (INDEPENDENT_AMBULATORY_CARE_PROVIDER_SITE_OTHER): Payer: 59

## 2015-04-17 ENCOUNTER — Ambulatory Visit (HOSPITAL_BASED_OUTPATIENT_CLINIC_OR_DEPARTMENT_OTHER)
Admission: RE | Admit: 2015-04-17 | Discharge: 2015-04-17 | Disposition: A | Discharge status: Home / Self Care | Payer: 59 | Source: Ambulatory Visit | Attending: Family Medicine | Admitting: Family Medicine

## 2015-04-17 DIAGNOSIS — E782 Mixed hyperlipidemia: Secondary | ICD-10-CM | POA: Diagnosis not present

## 2015-04-17 DIAGNOSIS — I10 Essential (primary) hypertension: Secondary | ICD-10-CM | POA: Diagnosis not present

## 2015-04-17 DIAGNOSIS — E042 Nontoxic multinodular goiter: Secondary | ICD-10-CM | POA: Insufficient documentation

## 2015-04-17 DIAGNOSIS — E010 Iodine-deficiency related diffuse (endemic) goiter: Secondary | ICD-10-CM

## 2015-04-17 DIAGNOSIS — E049 Nontoxic goiter, unspecified: Secondary | ICD-10-CM | POA: Diagnosis not present

## 2015-04-17 DIAGNOSIS — N529 Male erectile dysfunction, unspecified: Secondary | ICD-10-CM

## 2015-04-17 LAB — TESTOSTERONE: Testosterone: 175.2 ng/dL — ABNORMAL LOW (ref 300.00–890.00)

## 2015-04-17 LAB — CBC WITH DIFFERENTIAL/PLATELET
BASOS PCT: 1 % (ref 0.0–3.0)
Basophils Absolute: 0 10*3/uL (ref 0.0–0.1)
Eosinophils Absolute: 0.1 10*3/uL (ref 0.0–0.7)
Eosinophils Relative: 2.2 % (ref 0.0–5.0)
HCT: 41.9 % (ref 39.0–52.0)
HEMOGLOBIN: 14.5 g/dL (ref 13.0–17.0)
LYMPHS ABS: 1.7 10*3/uL (ref 0.7–4.0)
Lymphocytes Relative: 36 % (ref 12.0–46.0)
MCHC: 34.7 g/dL (ref 30.0–36.0)
MCV: 90.4 fl (ref 78.0–100.0)
Monocytes Absolute: 0.3 10*3/uL (ref 0.1–1.0)
Monocytes Relative: 6.9 % (ref 3.0–12.0)
NEUTROS PCT: 53.9 % (ref 43.0–77.0)
Neutro Abs: 2.6 10*3/uL (ref 1.4–7.7)
Platelets: 157 10*3/uL (ref 150.0–400.0)
RBC: 4.64 Mil/uL (ref 4.22–5.81)
RDW: 13.6 % (ref 11.5–15.5)
WBC: 4.8 10*3/uL (ref 4.0–10.5)

## 2015-04-17 LAB — COMPREHENSIVE METABOLIC PANEL
ALT: 16 U/L (ref 0–53)
AST: 15 U/L (ref 0–37)
Albumin: 4.3 g/dL (ref 3.5–5.2)
Alkaline Phosphatase: 82 U/L (ref 39–117)
BUN: 13 mg/dL (ref 6–23)
CO2: 29 mEq/L (ref 19–32)
Calcium: 9.1 mg/dL (ref 8.4–10.5)
Chloride: 101 mEq/L (ref 96–112)
Creatinine, Ser: 0.9 mg/dL (ref 0.40–1.50)
GFR: 97.26 mL/min (ref >60.00)
GLUCOSE: 309 mg/dL — AB (ref 70–99)
POTASSIUM: 4 meq/L (ref 3.5–5.1)
SODIUM: 134 meq/L — AB (ref 135–145)
TOTAL PROTEIN: 6.9 g/dL (ref 6.0–8.3)
Total Bilirubin: 0.8 mg/dL (ref 0.2–1.2)

## 2015-04-17 LAB — LIPID PANEL
Cholesterol: 98 mg/dL (ref 0–200)
HDL: 37.7 mg/dL — AB (ref >39.00)
LDL CALC: 39 mg/dL (ref 0–99)
NonHDL: 60.3
Total CHOL/HDL Ratio: 3
Triglycerides: 108 mg/dL (ref 0.0–149.0)
VLDL: 21.6 mg/dL (ref 0.0–40.0)

## 2015-04-17 LAB — T4, FREE: Free T4: 0.6 ng/dL (ref 0.60–1.60)

## 2015-04-17 LAB — TSH: TSH: 1.11 u[IU]/mL (ref 0.35–4.50)

## 2015-04-25 ENCOUNTER — Encounter: Payer: Self-pay | Admitting: Family Medicine

## 2015-04-25 DIAGNOSIS — R7989 Other specified abnormal findings of blood chemistry: Secondary | ICD-10-CM | POA: Insufficient documentation

## 2015-04-25 HISTORY — DX: Other specified abnormal findings of blood chemistry: R79.89

## 2015-04-25 NOTE — Assessment & Plan Note (Signed)
Well controlled, no changes to meds. Encouraged heart healthy diet such as the DASH diet and exercise as tolerated.  °

## 2015-04-25 NOTE — Assessment & Plan Note (Addendum)
Following with Dr Marlou Sa of ortho for his right shoulder and wrist pain. Given refill on Tramadol to use prn

## 2015-04-25 NOTE — Assessment & Plan Note (Signed)
Low level on testing, encouraged to increase exercise, minimize pain meds and recheck in 3 months.
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
## 2015-04-25 NOTE — Assessment & Plan Note (Signed)
Doing well with pump.sugars improving. Follows with endocrinology

## 2015-04-25 NOTE — Assessment & Plan Note (Signed)
Tolerating statin, encouraged heart healthy diet, avoid trans fats, minimize simple carbs and saturated fats. Increase exercise as tolerated 

## 2015-04-25 NOTE — Assessment & Plan Note (Signed)
H/o numerous nodules in past with numerous biopsies, always negative. Will proceed with ultrasound to further evaluate

## 2015-04-30 ENCOUNTER — Telehealth: Payer: Self-pay | Admitting: *Deleted

## 2015-04-30 ENCOUNTER — Emergency Department (HOSPITAL_BASED_OUTPATIENT_CLINIC_OR_DEPARTMENT_OTHER): Payer: 59

## 2015-04-30 ENCOUNTER — Observation Stay (HOSPITAL_BASED_OUTPATIENT_CLINIC_OR_DEPARTMENT_OTHER)
Admission: EM | Admit: 2015-04-30 | Discharge: 2015-05-01 | Disposition: A | Discharge status: Home / Self Care | Payer: 59 | Attending: Internal Medicine | Admitting: Internal Medicine

## 2015-04-30 ENCOUNTER — Emergency Department (HOSPITAL_BASED_OUTPATIENT_CLINIC_OR_DEPARTMENT_OTHER)
Admission: EM | Admit: 2015-04-30 | Discharge: 2015-04-30 | Disposition: A | Discharge status: Home / Self Care | Payer: 59 | Attending: Emergency Medicine | Admitting: Emergency Medicine

## 2015-04-30 ENCOUNTER — Encounter (HOSPITAL_BASED_OUTPATIENT_CLINIC_OR_DEPARTMENT_OTHER): Payer: Self-pay

## 2015-04-30 DIAGNOSIS — F329 Major depressive disorder, single episode, unspecified: Secondary | ICD-10-CM | POA: Diagnosis not present

## 2015-04-30 DIAGNOSIS — Z79899 Other long term (current) drug therapy: Secondary | ICD-10-CM | POA: Diagnosis not present

## 2015-04-30 DIAGNOSIS — H60399 Other infective otitis externa, unspecified ear: Secondary | ICD-10-CM | POA: Diagnosis present

## 2015-04-30 DIAGNOSIS — IMO0002 Reserved for concepts with insufficient information to code with codable children: Secondary | ICD-10-CM | POA: Diagnosis present

## 2015-04-30 DIAGNOSIS — E1065 Type 1 diabetes mellitus with hyperglycemia: Secondary | ICD-10-CM

## 2015-04-30 DIAGNOSIS — H9202 Otalgia, left ear: Secondary | ICD-10-CM | POA: Diagnosis present

## 2015-04-30 DIAGNOSIS — Z8701 Personal history of pneumonia (recurrent): Secondary | ICD-10-CM | POA: Diagnosis not present

## 2015-04-30 DIAGNOSIS — G43909 Migraine, unspecified, not intractable, without status migrainosus: Secondary | ICD-10-CM | POA: Insufficient documentation

## 2015-04-30 DIAGNOSIS — E1049 Type 1 diabetes mellitus with other diabetic neurological complication: Secondary | ICD-10-CM | POA: Diagnosis present

## 2015-04-30 DIAGNOSIS — K219 Gastro-esophageal reflux disease without esophagitis: Secondary | ICD-10-CM | POA: Insufficient documentation

## 2015-04-30 DIAGNOSIS — I10 Essential (primary) hypertension: Secondary | ICD-10-CM | POA: Insufficient documentation

## 2015-04-30 DIAGNOSIS — H6092 Unspecified otitis externa, left ear: Secondary | ICD-10-CM | POA: Insufficient documentation

## 2015-04-30 DIAGNOSIS — E119 Type 2 diabetes mellitus without complications: Secondary | ICD-10-CM | POA: Diagnosis not present

## 2015-04-30 DIAGNOSIS — R079 Chest pain, unspecified: Secondary | ICD-10-CM | POA: Diagnosis not present

## 2015-04-30 DIAGNOSIS — E785 Hyperlipidemia, unspecified: Secondary | ICD-10-CM | POA: Diagnosis not present

## 2015-04-30 DIAGNOSIS — H60392 Other infective otitis externa, left ear: Secondary | ICD-10-CM

## 2015-04-30 DIAGNOSIS — H269 Unspecified cataract: Secondary | ICD-10-CM | POA: Diagnosis not present

## 2015-04-30 DIAGNOSIS — E1041 Type 1 diabetes mellitus with diabetic mononeuropathy: Secondary | ICD-10-CM

## 2015-04-30 DIAGNOSIS — Z794 Long term (current) use of insulin: Secondary | ICD-10-CM | POA: Diagnosis not present

## 2015-04-30 DIAGNOSIS — F410 Panic disorder [episodic paroxysmal anxiety] without agoraphobia: Secondary | ICD-10-CM | POA: Insufficient documentation

## 2015-04-30 DIAGNOSIS — Z720 Tobacco use: Secondary | ICD-10-CM | POA: Insufficient documentation

## 2015-04-30 DIAGNOSIS — F32A Depression, unspecified: Secondary | ICD-10-CM | POA: Diagnosis present

## 2015-04-30 DIAGNOSIS — G2581 Restless legs syndrome: Secondary | ICD-10-CM | POA: Diagnosis not present

## 2015-04-30 LAB — BASIC METABOLIC PANEL
Anion gap: 10 (ref 5–15)
BUN: 11 mg/dL (ref 6–20)
CO2: 22 mmol/L (ref 22–32)
CREATININE: 0.82 mg/dL (ref 0.61–1.24)
Calcium: 9.5 mg/dL (ref 8.9–10.3)
Chloride: 104 mmol/L (ref 101–111)
GFR calc Af Amer: >60 mL/min (ref >60)
GLUCOSE: 295 mg/dL — AB (ref 65–99)
POTASSIUM: 4 mmol/L (ref 3.5–5.1)
Sodium: 136 mmol/L (ref 135–145)

## 2015-04-30 LAB — CBC WITH DIFFERENTIAL/PLATELET
Basophils Absolute: 0 10*3/uL (ref 0.0–0.1)
Basophils Relative: 1 % (ref 0–1)
Eosinophils Absolute: 0.1 10*3/uL (ref 0.0–0.7)
Eosinophils Relative: 2 % (ref 0–5)
HEMATOCRIT: 42.1 % (ref 39.0–52.0)
Hemoglobin: 15.2 g/dL (ref 13.0–17.0)
LYMPHS ABS: 2.1 10*3/uL (ref 0.7–4.0)
Lymphocytes Relative: 29 % (ref 12–46)
MCH: 31 pg (ref 26.0–34.0)
MCHC: 36.1 g/dL — ABNORMAL HIGH (ref 30.0–36.0)
MCV: 85.9 fL (ref 78.0–100.0)
Monocytes Absolute: 0.6 10*3/uL (ref 0.1–1.0)
Monocytes Relative: 8 % (ref 3–12)
NEUTROS PCT: 60 % (ref 43–77)
Neutro Abs: 4.4 10*3/uL (ref 1.7–7.7)
Platelets: 175 10*3/uL (ref 150–400)
RBC: 4.9 MIL/uL (ref 4.22–5.81)
RDW: 12.6 % (ref 11.5–15.5)
WBC: 7.3 10*3/uL (ref 4.0–10.5)

## 2015-04-30 LAB — TROPONIN I: Troponin I: <0.03 ng/mL (ref <0.031)

## 2015-04-30 MED ORDER — DIAZEPAM 2 MG PO TABS
2.0000 mg | ORAL_TABLET | Freq: Two times a day (BID) | ORAL | Status: DC | PRN
  Administered 2015-05-01: 2 mg via ORAL
  Filled 2015-04-30: qty 1

## 2015-04-30 MED ORDER — ACETAMINOPHEN 500 MG PO TABS
1000.0000 mg | ORAL_TABLET | Freq: Once | ORAL | Status: AC
  Administered 2015-04-30: 1000 mg via ORAL
  Filled 2015-04-30: qty 2

## 2015-04-30 MED ORDER — INSULIN PUMP
Freq: Three times a day (TID) | SUBCUTANEOUS | Status: DC
  Administered 2015-05-01 (×2): via SUBCUTANEOUS
  Filled 2015-04-30: qty 1

## 2015-04-30 MED ORDER — ASPIRIN 81 MG PO CHEW
324.0000 mg | CHEWABLE_TABLET | Freq: Once | ORAL | Status: AC
  Administered 2015-04-30: 324 mg via ORAL
  Filled 2015-04-30: qty 4

## 2015-04-30 MED ORDER — GI COCKTAIL ~~LOC~~
30.0000 mL | Freq: Four times a day (QID) | ORAL | Status: DC | PRN
  Administered 2015-05-01: 30 mL via ORAL
  Filled 2015-04-30: qty 30

## 2015-04-30 MED ORDER — DULOXETINE HCL 60 MG PO CPEP
60.0000 mg | ORAL_CAPSULE | Freq: Every day | ORAL | Status: DC
  Administered 2015-05-01: 60 mg via ORAL
  Filled 2015-04-30: qty 1

## 2015-04-30 MED ORDER — BENAZEPRIL HCL 40 MG PO TABS
40.0000 mg | ORAL_TABLET | Freq: Every day | ORAL | Status: DC
  Administered 2015-05-01: 40 mg via ORAL
  Filled 2015-04-30: qty 1

## 2015-04-30 MED ORDER — HEPARIN (PORCINE) IN NACL 100-0.45 UNIT/ML-% IJ SOLN
1300.0000 [IU]/h | INTRAMUSCULAR | Status: DC
  Administered 2015-05-01: 1300 [IU]/h via INTRAVENOUS
  Filled 2015-04-30: qty 250

## 2015-04-30 MED ORDER — ASPIRIN EC 325 MG PO TBEC
325.0000 mg | DELAYED_RELEASE_TABLET | Freq: Every day | ORAL | Status: DC
  Administered 2015-05-01: 325 mg via ORAL
  Filled 2015-04-30 (×2): qty 1

## 2015-04-30 MED ORDER — ACETAMINOPHEN 325 MG PO TABS: 650.0000 mg | ORAL_TABLET | ORAL | Status: DC | PRN

## 2015-04-30 MED ORDER — ONDANSETRON HCL 4 MG/2ML IJ SOLN: 4.0000 mg | Freq: Four times a day (QID) | INTRAMUSCULAR | Status: DC | PRN

## 2015-04-30 MED ORDER — MORPHINE SULFATE 4 MG/ML IJ SOLN
4.0000 mg | Freq: Once | INTRAMUSCULAR | Status: AC
  Administered 2015-04-30: 4 mg via INTRAVENOUS
  Filled 2015-04-30: qty 1

## 2015-04-30 MED ORDER — ATORVASTATIN CALCIUM 80 MG PO TABS
80.0000 mg | ORAL_TABLET | Freq: Every day | ORAL | Status: DC
  Administered 2015-05-01: 80 mg via ORAL
  Filled 2015-04-30: qty 1

## 2015-04-30 MED ORDER — HYDRALAZINE HCL 20 MG/ML IJ SOLN: 10.0000 mg | Freq: Four times a day (QID) | INTRAMUSCULAR | Status: DC | PRN

## 2015-04-30 MED ORDER — NEOMYCIN-POLYMYXIN-HC 3.5-10000-1 OT SUSP: 4.0000 [drp] | Freq: Four times a day (QID) | OTIC | Status: DC

## 2015-04-30 MED ORDER — GABAPENTIN 300 MG PO CAPS
300.0000 mg | ORAL_CAPSULE | Freq: Three times a day (TID) | ORAL | Status: DC
  Administered 2015-05-01 (×2): 300 mg via ORAL
  Filled 2015-04-30 (×2): qty 1

## 2015-04-30 MED ORDER — NITROGLYCERIN 0.4 MG SL SUBL
0.4000 mg | SUBLINGUAL_TABLET | SUBLINGUAL | Status: DC | PRN
  Administered 2015-04-30: 0.4 mg via SUBLINGUAL
  Filled 2015-04-30: qty 1

## 2015-04-30 MED ORDER — MORPHINE SULFATE 2 MG/ML IJ SOLN
2.0000 mg | INTRAMUSCULAR | Status: DC | PRN
  Administered 2015-05-01: 2 mg via INTRAVENOUS
  Filled 2015-04-30: qty 1

## 2015-04-30 MED ORDER — METOPROLOL TARTRATE 25 MG PO TABS
25.0000 mg | ORAL_TABLET | Freq: Two times a day (BID) | ORAL | Status: DC
  Administered 2015-05-01 (×2): 25 mg via ORAL
  Filled 2015-04-30 (×2): qty 1

## 2015-04-30 MED ORDER — AMLODIPINE BESYLATE 10 MG PO TABS
10.0000 mg | ORAL_TABLET | Freq: Every day | ORAL | Status: DC
  Administered 2015-05-01: 10 mg via ORAL
  Filled 2015-04-30: qty 1

## 2015-04-30 MED ORDER — NEOMYCIN-POLYMYXIN-HC 3.5-10000-1 OT SUSP
4.0000 [drp] | Freq: Three times a day (TID) | OTIC | Status: DC
  Administered 2015-05-01 (×3): 4 [drp] via OTIC
  Filled 2015-04-30: qty 10

## 2015-04-30 MED ORDER — PANTOPRAZOLE SODIUM 40 MG PO TBEC
40.0000 mg | DELAYED_RELEASE_TABLET | Freq: Two times a day (BID) | ORAL | Status: DC
  Administered 2015-05-01 (×2): 40 mg via ORAL
  Filled 2015-04-30 (×2): qty 1

## 2015-04-30 MED ORDER — HEPARIN BOLUS VIA INFUSION
4000.0000 [IU] | Freq: Once | INTRAVENOUS | Status: AC
  Administered 2015-05-01: 4000 [IU] via INTRAVENOUS
  Filled 2015-04-30: qty 4000

## 2015-04-30 NOTE — ED Notes (Signed)
C/o drainage from left ear x 3-4 days
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
## 2015-04-30 NOTE — ED Notes (Signed)
CP x 1 hour-seen here earlier today for earache
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
## 2015-04-30 NOTE — ED Notes (Signed)
DC instructions reviewed with pt, instructions provided on application of ear drops. Pt teaching done with teach back and demonstration. Opportunity for questions provided

## 2015-04-30 NOTE — Discharge Instructions (Signed)
Apply antibiotic-coated eardrops as directed. Follow-up with your primary care physician.  Otitis Externa Otitis externa is a bacterial or fungal infection of the outer ear canal. This is the area from the eardrum to the outside of the ear. Otitis externa is sometimes called "swimmer's ear." CAUSES  Possible causes of infection include:  Swimming in dirty water.  Moisture remaining in the ear after swimming or bathing.  Mild injury (trauma) to the ear.  Objects stuck in the ear (foreign body).  Cuts or scrapes (abrasions) on the outside of the ear. SIGNS AND SYMPTOMS  The first symptom of infection is often itching in the ear canal. Later signs and symptoms may include swelling and redness of the ear canal, ear pain, and yellowish-white fluid (pus) coming from the ear. The ear pain may be worse when pulling on the earlobe. DIAGNOSIS  Your health care provider will perform a physical exam. A sample of fluid may be taken from the ear and examined for bacteria or fungi. TREATMENT  Antibiotic ear drops are often given for 10 to 14 days. Treatment may also include pain medicine or corticosteroids to reduce itching and swelling. HOME CARE INSTRUCTIONS   Apply antibiotic ear drops to the ear canal as prescribed by your health care provider.  Take medicines only as directed by your health care provider.  If you have diabetes, follow any additional treatment instructions from your health care provider.  Keep all follow-up visits as directed by your health care provider. PREVENTION   Keep your ear dry. Use the corner of a towel to absorb water out of the ear canal after swimming or bathing.  Avoid scratching or putting objects inside your ear. This can damage the ear canal or remove the protective wax that lines the canal. This makes it easier for bacteria and fungi to grow.  Avoid swimming in lakes, polluted water, or poorly chlorinated pools.  You may use ear drops made of rubbing  alcohol and vinegar after swimming. Combine equal parts of white vinegar and alcohol in a bottle. Put 3 or 4 drops into each ear after swimming. SEEK MEDICAL CARE IF:   You have a fever.  Your ear is still red, swollen, painful, or draining pus after 3 days.  Your redness, swelling, or pain gets worse.  You have a severe headache.  You have redness, swelling, pain, or tenderness in the area behind your ear. MAKE SURE YOU:   Understand these instructions.  Will watch your condition.  Will get help right away if you are not doing well or get worse. Document Released: 11/07/2005 Document Revised: 03/24/2014 Document Reviewed: 11/24/2011 [facility] Patient Information 2015 [place], [place]. This information is not intended to replace advice given to you by your health care provider. Make sure you discuss any questions you have with your health care provider.

## 2015-05-01 ENCOUNTER — Telehealth: Payer: Self-pay | Admitting: *Deleted

## 2015-05-01 ENCOUNTER — Observation Stay (HOSPITAL_COMMUNITY): Payer: 59

## 2015-05-01 DIAGNOSIS — R079 Chest pain, unspecified: Secondary | ICD-10-CM | POA: Diagnosis not present

## 2015-05-01 LAB — COMPREHENSIVE METABOLIC PANEL
ALBUMIN: 3.5 g/dL (ref 3.5–5.0)
ALT: 18 U/L (ref 17–63)
AST: 14 U/L — AB (ref 15–41)
Alkaline Phosphatase: 66 U/L (ref 38–126)
Anion gap: 11 (ref 5–15)
BILIRUBIN TOTAL: 1.2 mg/dL (ref 0.3–1.2)
BUN: 12 mg/dL (ref 6–20)
CALCIUM: 8.9 mg/dL (ref 8.9–10.3)
CHLORIDE: 104 mmol/L (ref 101–111)
CO2: 23 mmol/L (ref 22–32)
Creatinine, Ser: 0.91 mg/dL (ref 0.61–1.24)
GFR calc non Af Amer: >60 mL/min (ref >60)
Glucose, Bld: 93 mg/dL (ref 65–99)
Potassium: 3.6 mmol/L (ref 3.5–5.1)
Sodium: 138 mmol/L (ref 135–145)
TOTAL PROTEIN: 5.8 g/dL — AB (ref 6.5–8.1)

## 2015-05-01 LAB — TROPONIN I
Troponin I: <0.03 ng/mL (ref <0.031)
Troponin I: <0.03 ng/mL (ref <0.031)

## 2015-05-01 LAB — CBC
HEMATOCRIT: 40 % (ref 39.0–52.0)
Hemoglobin: 14.5 g/dL (ref 13.0–17.0)
MCH: 31 pg (ref 26.0–34.0)
MCHC: 36.3 g/dL — AB (ref 30.0–36.0)
MCV: 85.7 fL (ref 78.0–100.0)
Platelets: 162 10*3/uL (ref 150–400)
RBC: 4.67 MIL/uL (ref 4.22–5.81)
RDW: 12.7 % (ref 11.5–15.5)
WBC: 6.3 10*3/uL (ref 4.0–10.5)

## 2015-05-01 LAB — RAPID URINE DRUG SCREEN, HOSP PERFORMED
Amphetamines: POSITIVE — AB
Barbiturates: NOT DETECTED
Benzodiazepines: POSITIVE — AB
Cocaine: NOT DETECTED
OPIATES: POSITIVE — AB
Tetrahydrocannabinol: NOT DETECTED

## 2015-05-01 LAB — GLUCOSE, CAPILLARY
Glucose-Capillary: 220 mg/dL — ABNORMAL HIGH (ref 65–99)
Glucose-Capillary: 68 mg/dL (ref 65–99)
Glucose-Capillary: 89 mg/dL (ref 65–99)
Glucose-Capillary: 180 mg/dL — ABNORMAL HIGH (ref 65–99)

## 2015-05-01 LAB — HEPARIN LEVEL (UNFRACTIONATED): HEPARIN UNFRACTIONATED: 0.51 [IU]/mL (ref 0.30–0.70)

## 2015-05-01 MED ORDER — REGADENOSON 0.4 MG/5ML IV SOLN
0.4000 mg | Freq: Once | INTRAVENOUS | Status: AC
  Administered 2015-05-01: 0.4 mg via INTRAVENOUS
  Filled 2015-05-01: qty 5

## 2015-05-01 MED ORDER — TECHNETIUM TC 99M SESTAMIBI GENERIC - CARDIOLITE
10.0000 | Freq: Once | INTRAVENOUS | Status: AC | PRN
  Administered 2015-05-01: 10 via INTRAVENOUS

## 2015-05-01 MED ORDER — DEXTROSE 50 % IV SOLN
25.0000 mL | Freq: Once | INTRAVENOUS | Status: AC
  Administered 2015-05-01: 25 mL via INTRAVENOUS
  Filled 2015-05-01: qty 50

## 2015-05-01 MED ORDER — REGADENOSON 0.4 MG/5ML IV SOLN
INTRAVENOUS | Status: AC
  Administered 2015-05-01: 0.4 mg via INTRAVENOUS
  Filled 2015-05-01: qty 5

## 2015-05-01 MED ORDER — METOPROLOL TARTRATE 25 MG PO TABS: 25.0000 mg | ORAL_TABLET | Freq: Every morning | ORAL | Status: DC

## 2015-05-01 MED ORDER — MORPHINE SULFATE 4 MG/ML IJ SOLN
INTRAMUSCULAR | Status: AC
  Filled 2015-05-01: qty 1

## 2015-05-01 MED ORDER — MORPHINE SULFATE 2 MG/ML IJ SOLN
2.0000 mg | INTRAMUSCULAR | Status: DC | PRN
  Administered 2015-05-01 (×4): 2 mg via INTRAVENOUS
  Filled 2015-05-01 (×3): qty 1

## 2015-05-01 MED ORDER — TECHNETIUM TC 99M SESTAMIBI - CARDIOLITE
30.0000 | Freq: Once | INTRAVENOUS | Status: AC | PRN
  Administered 2015-05-01: 30 via INTRAVENOUS

## 2015-05-01 NOTE — Discharge Summary (Signed)
Physician Discharge Summary  [name] MRN: [id number] DOB/AGE: Dec 31, 1970 44 y.o.  PCP: Penni Homans, MD   Admit date: 04/30/2015 Discharge date: 05/01/2015  Discharge Diagnoses:     Principal Problem:   Chest pain Active Problems:   Hyperlipidemia   Depression   Essential hypertension, benign   Type 1 diabetes mellitus with neurological manifestations, uncontrolled   Otitis, externa, infective   Pain in the chest  Follow-up recommendations Follow-up with PCP in 3-5 days     Medication List    TAKE these medications        amLODipine 10 MG tablet  Commonly known as:  NORVASC  Take 1 tablet (10 mg total) by mouth daily.     aspirin-acetaminophen-caffeine 627-035-00 MG per tablet  Commonly known as:  EXCEDRIN MIGRAINE  Take 2 tablets by mouth every 6 (six) hours as needed for headache.     atorvastatin 80 MG tablet  Commonly known as:  LIPITOR  TAKE 1 TABLET (80 MG TOTAL) BY MOUTH DAILY.     BAYER CONTOUR NEXT TEST test strip  Generic drug:  glucose blood  USE AS INSTRUCTED TO CHECK BLOOD SUGAR 7 TIMES PER DAY     BAYER MICROLET 2 LANCING DEVIC Misc  USE AS DIRECTED TO CHECK BLOOD SUGAR     BAYER MICROLET LANCETS lancets  USE AS DIRECTED TO CHECK BLOOD SUGAR     benazepril 40 MG tablet  Commonly known as:  LOTENSIN  Take 1 tablet (40 mg total) by mouth daily.     diazepam 5 MG tablet  Commonly known as:  VALIUM  TAKE 1 TABLET BY MOUTH TWICE DAILY AS NEEDED FOR ANXIETY OR MUSCLE SPASMS     DULoxetine 60 MG capsule  Commonly known as:  CYMBALTA  TAKE 1 CAPSULE (60 MG TOTAL) BY MOUTH DAILY.     gabapentin 300 MG capsule  Commonly known as:  NEURONTIN  TAKE 3 CAPSULES BY MOUTH TAKE 3 TIMES A DAY     GNP ALCOHOL SWABS 70 % Pads  Apply 1 each topically as needed.     insulin aspart 100 UNIT/ML injection  Commonly known as:  NOVOLOG  Inject 140-150 Units into the skin daily. Use 100 units daily in insulin pump as advised.     metoprolol tartrate 25  MG tablet  Commonly known as:  LOPRESSOR  Take 1 tablet (25 mg total) by mouth every morning.     neomycin-polymyxin-hydrocortisone 3.5-10000-1 otic suspension  Commonly known as:  CORTISPORIN  Place 4 drops into both ears 4 (four) times daily. X 7 days     ONE TOUCH ULTRA SYSTEM KIT W/DEVICE Kit  1 kit by Does not apply route once.     pantoprazole 40 MG tablet  Commonly known as:  PROTONIX  Take 1 tablet (40 mg total) by mouth daily.     SUMAtriptan 100 MG tablet  Commonly known as:  IMITREX  TAKE 1 TABLET (100 MG TOTAL) BY MOUTH EVERY 2 (TWO) HOURS AS NEEDED. FOR HEADACHE     tadalafil 5 MG tablet  Commonly known as:  CIALIS  Take 1 tablet (5 mg total) by mouth daily.     traMADol 50 MG tablet  Commonly known as:  ULTRAM  Take 1 tablet (50 mg total) by mouth every 5 (five) hours as needed.         Discharge Condition: Stable  Disposition: 01-Home or Self Care   Consults:  Cardiology   Significant Diagnostic Studies:  Dg  Chest 2 View  04/30/2015   CLINICAL DATA:  Chest pain.  Sudden onset of symptoms earlier today.  EXAM: CHEST  2 VIEW  COMPARISON:  11/17/2014.  FINDINGS: Cardiopericardial silhouette within normal limits. Mediastinal contours normal. Trachea midline. No airspace disease or effusion. Monitoring leads project over the chest.  IMPRESSION: No active cardiopulmonary disease.   Electronically Signed   By: Dereck Ligas M.D.   On: 04/30/2015 20:58   US Soft Tissue Head/neck  04/17/2015   CLINICAL DATA:  Bilateral thyroid nodules.  EXAM: THYROID ULTRASOUND  TECHNIQUE: Ultrasound examination of the thyroid gland and adjacent soft tissues was performed.  COMPARISON:  08/04/2011  FINDINGS: Right thyroid lobe  Measurements: 57 x 14 x 19 mm. Mildly inhomogeneous background echotexture. Several lower pole nodules. Largest complex cystic, 16 x 11 x 9 mm, deep inferior pole (previously 18 x 8 x 8). More inferiorly, an 8 x 7 x 6 mm hypoechoic nodule with calcification  (previously 9 x 9 x 7). Additional smaller nodules less than 5 mm.  Left thyroid lobe  Measurements: 44 x 10 x 18 mm. 3 x 4 x 1 mm complex cyst, inferior pole.  Isthmus  Thickness: 3 mm.  No nodules visualized.  Lymphadenopathy  None visualized.  IMPRESSION: 1. Mild thyromegaly with bilateral nodules , not increased in size since 08/04/2011. Findings do not meet current consensus criteria for biopsy. Follow-up by clinical exam is recommended. If patient has known risk factors for thyroid carcinoma, consider follow-up ultrasound in 12 months. If patient is clinically hyperthyroid, consider nuclear medicine thyroid uptake and scan. This recommendation follows the consensus statement: Management of Thyroid Nodules Detected as [place]: Society of Radiologists in [place]. Radiology 2005; [id number].   Electronically Signed   By: Lucrezia Europe M.D.   On: 04/17/2015 15:23        Filed Weights   04/30/15 1858 04/30/15 2301 05/01/15 0400  Weight: 96.616 kg (213 lb) 91.672 kg (202 lb 1.6 oz) 91.672 kg (202 lb 1.6 oz)     Microbiology: No results found for this or any previous visit (from the past 240 hour(s)).     Blood Culture    Component Value Date/Time   SDES ABSCESS RIGHT ELBOW 06/04/2011 1540   SPECREQUEST NONE 06/04/2011 1540   CULT NO GROWTH 3 DAYS 06/04/2011 1540   REPTSTATUS 06/08/2011 FINAL 06/04/2011 1540      Labs: Results for orders placed or performed during the hospital encounter of 04/30/15 (from the past 48 hour(s))  CBC with Differential     Status: Abnormal   Collection Time: 04/30/15  8:00 PM  Result Value Ref Range   WBC 7.3 4.0 - 10.5 K/uL   RBC 4.90 4.22 - 5.81 MIL/uL   Hemoglobin 15.2 13.0 - 17.0 g/dL   HCT 42.1 39.0 - 52.0 %   MCV 85.9 78.0 - 100.0 fL   MCH 31.0 26.0 - 34.0 pg   MCHC 36.1 (H) 30.0 - 36.0 g/dL   RDW 12.6 11.5 - 15.5 %   Platelets 175 150 - 400 K/uL   Neutrophils Relative % 60 43 - 77 %   Neutro Abs 4.4 1.7 - 7.7  K/uL   Lymphocytes Relative 29 12 - 46 %   Lymphs Abs 2.1 0.7 - 4.0 K/uL   Monocytes Relative 8 3 - 12 %   Monocytes Absolute 0.6 0.1 - 1.0 K/uL   Eosinophils Relative 2 0 - 5 %   Eosinophils Absolute 0.1 0.0 - 0.7  K/uL   Basophils Relative 1 0 - 1 %   Basophils Absolute 0.0 0.0 - 0.1 K/uL  Basic metabolic panel     Status: Abnormal   Collection Time: 04/30/15  8:00 PM  Result Value Ref Range   Sodium 136 135 - 145 mmol/L   Potassium 4.0 3.5 - 5.1 mmol/L   Chloride 104 101 - 111 mmol/L   CO2 22 22 - 32 mmol/L   Glucose, Bld 295 (H) 65 - 99 mg/dL   BUN 11 6 - 20 mg/dL   Creatinine, Ser 0.82 0.61 - 1.24 mg/dL   Calcium 9.5 8.9 - 10.3 mg/dL   GFR calc non Af Amer >60 >60 mL/min   GFR calc Af Amer >60 >60 mL/min    Comment: (NOTE) The eGFR has been calculated using the CKD EPI equation. This calculation has not been validated in all clinical situations. eGFR's persistently <60 mL/min signify possible Chronic Kidney Disease.    Anion gap 10 5 - 15  Troponin I     Status: None   Collection Time: 04/30/15  8:00 PM  Result Value Ref Range   Troponin I <0.03 <0.031 ng/mL    Comment:        NO INDICATION OF MYOCARDIAL INJURY.   Troponin I     Status: None   Collection Time: 05/01/15 12:10 AM  Result Value Ref Range   Troponin I <0.03 <0.031 ng/mL    Comment:        NO INDICATION OF MYOCARDIAL INJURY.   Glucose, capillary     Status: Abnormal   Collection Time: 05/01/15  2:14 AM  Result Value Ref Range   Glucose-Capillary 220 (H) 65 - 99 mg/dL  Urine rapid drug screen (hosp performed)not at [facility]     Status: Abnormal   Collection Time: 05/01/15  4:47 AM  Result Value Ref Range   Opiates POSITIVE (A) NONE DETECTED   Cocaine NONE DETECTED NONE DETECTED   Benzodiazepines POSITIVE (A) NONE DETECTED   Amphetamines POSITIVE (A) NONE DETECTED   Tetrahydrocannabinol NONE DETECTED NONE DETECTED   Barbiturates NONE DETECTED NONE DETECTED    Comment:        DRUG SCREEN FOR MEDICAL  PURPOSES ONLY.  IF CONFIRMATION IS NEEDED FOR ANY PURPOSE, NOTIFY LAB WITHIN 5 DAYS.        LOWEST DETECTABLE LIMITS FOR URINE DRUG SCREEN Drug Class       Cutoff (ng/mL) Amphetamine      1000 Barbiturate      200 Benzodiazepine   170 Tricyclics       017 Opiates          300 Cocaine          300 THC              50   Troponin I     Status: None   Collection Time: 05/01/15  5:05 AM  Result Value Ref Range   Troponin I <0.03 <0.031 ng/mL    Comment:        NO INDICATION OF MYOCARDIAL INJURY.   CBC     Status: Abnormal   Collection Time: 05/01/15  5:05 AM  Result Value Ref Range   WBC 6.3 4.0 - 10.5 K/uL   RBC 4.67 4.22 - 5.81 MIL/uL   Hemoglobin 14.5 13.0 - 17.0 g/dL   HCT 40.0 39.0 - 52.0 %   MCV 85.7 78.0 - 100.0 fL   MCH 31.0 26.0 - 34.0 pg   MCHC  36.3 (H) 30.0 - 36.0 g/dL   RDW 12.7 11.5 - 15.5 %   Platelets 162 150 - 400 K/uL  Comprehensive metabolic panel     Status: Abnormal   Collection Time: 05/01/15  5:05 AM  Result Value Ref Range   Sodium 138 135 - 145 mmol/L   Potassium 3.6 3.5 - 5.1 mmol/L   Chloride 104 101 - 111 mmol/L   CO2 23 22 - 32 mmol/L   Glucose, Bld 93 65 - 99 mg/dL   BUN 12 6 - 20 mg/dL   Creatinine, Ser 0.91 0.61 - 1.24 mg/dL   Calcium 8.9 8.9 - 10.3 mg/dL   Total Protein 5.8 (L) 6.5 - 8.1 g/dL   Albumin 3.5 3.5 - 5.0 g/dL   AST 14 (L) 15 - 41 U/L   ALT 18 17 - 63 U/L   Alkaline Phosphatase 66 38 - 126 U/L   Total Bilirubin 1.2 0.3 - 1.2 mg/dL   GFR calc non Af Amer >60 >60 mL/min   GFR calc Af Amer >60 >60 mL/min    Comment: (NOTE) The eGFR has been calculated using the CKD EPI equation. This calculation has not been validated in all clinical situations. eGFR's persistently <60 mL/min signify possible Chronic Kidney Disease.    Anion gap 11 5 - 15  Glucose, capillary     Status: None   Collection Time: 05/01/15  6:26 AM  Result Value Ref Range   Glucose-Capillary 68 65 - 99 mg/dL  Heparin level (unfractionated)     Status:  None   Collection Time: 05/01/15  7:30 AM  Result Value Ref Range   Heparin Unfractionated 0.51 0.30 - 0.70 IU/mL    Comment:        IF HEPARIN RESULTS ARE BELOW EXPECTED VALUES, AND PATIENT DOSAGE HAS BEEN CONFIRMED, SUGGEST FOLLOW UP TESTING OF ANTITHROMBIN III LEVELS.   Glucose, capillary     Status: None   Collection Time: 05/01/15  8:24 AM  Result Value Ref Range   Glucose-Capillary 89 65 - 99 mg/dL     Lipid Panel     Component Value Date/Time   CHOL 98 04/17/2015 1349   TRIG 108.0 04/17/2015 1349   HDL 37.70* 04/17/2015 1349   CHOLHDL 3 04/17/2015 1349   VLDL 21.6 04/17/2015 1349   LDLCALC 39 04/17/2015 1349     Lab Results  Component Value Date   HGBA1C 8.1 03/26/2015   HGBA1C 9.2* 12/10/2014   HGBA1C 7.9* 09/11/2014     Lab Results  Component Value Date   MICROALBUR 9.7* 12/10/2014   LDLCALC 39 04/17/2015   CREATININE 0.91 05/01/2015     HPI :44 yo [place] man with poorly controlled HTN, IDDM (on Insulin pump), smoker, and reported history of premature CAD in father, admitted to medicine service with CP. Symptoms started few hours prior to his arrival at to [facility]. CP is located in the left upper chest, radiating to left arm, felt like pressure but sometimes sharp, later moved to left lower chest and then to right lower chest, no relation with activity but sometimes would increase on deep breathing or movement of torso. No syncope, palpitations, diaphoresis, orthopnea, resting dyspnea, edema, PND. He had some nausea earlier. In the ED, his BP was 171/95, HR 94. He was given ASA and sl NTG and transferred to our facility for admission. NTG resulted in severe heaqdache. Morphine did improve his CP significantly.   Apparently he was seen in the ER prior  to that for left earache and drainage. Given Cortisporin and discharged. He reports taking low dose Cialis 5 mg regularly and last dose was around 5 or 6 am on 04/30/15.   Here as an inpatient, he  reports recurrence of his symptoms, therefore started on Heparin infusion by the primary service. Given Cialis use recently, additional NTG not given.    HOSPITAL COURSE: 1 chest pain: Concerning for angina. Initial troponin and EKG were unremarkable, Because of his multiple risk factors and the nature of his pain he was started on IV heparin. Cardiology will be consulted. Continued on aspirin , initiated on metoprolol , Continue with his statin. Better blood pressure control. Avoid nitroglycerin as patient is on Cialis and last took at 5am. Patient is scheduled to have a Lexi scan Myoview study, if negative no further ischemia workup is recommended, strongly advised smoking cessation,  #2 History of insulin-dependent diabetes mellitus: He is on insulin pump. Continue insulin pump at home. A1c was 8.1 in May.  #3 history of benign essential hypertension: Blood pressure has been poorly controlled today. Continue with his antihypertensives medication regimen. Added metoprolol once a day  #4 history of depression: Continue with his home medications.  #5 Recently diagnosed left otitis externa: Continue with eardrops.  #6 history of hyperlipidemia: Continue with statin. Lipid panel was recently checked in May and LDL was 39.     Discharge Exam:    Blood pressure 128/78, pulse 66, temperature 97.8 F (36.6 C), temperature source Oral, resp. rate 16, height _0  (1.778 m), weight 91.672 kg (202 lb 1.6 oz), SpO2 99 %.   General: Well appearing. No respiratory difficulty HEENT: normal Neck: supple. no JVD. Carotids 2+ bilat; no bruits. No lymphadenopathy or thryomegaly appreciated. Cor: PMI nondisplaced. Regular rate & rhythm. No rubs, gallops or murmurs. Chest wall tenderness in R and L lower parasternal region Lungs: clear Abdomen: soft, nontender, nondistended. No hepatosplenomegaly. No bruits or masses. Good bowel sounds. Extremities: no cyanosis, clubbing, rash, edema Neuro: alert &  oriented x 3, cranial nerves grossly intact. moves all 4 extremities w/o difficulty. Affect pleasant.         Follow-up Information    Follow up with Penni Homans, MD. Schedule an appointment as soon as possible for a visit in 1 week.   Specialty:  Family Medicine   Contact information:   [address] [place] [address] [phone]       Signed: Reyne Dumas 05/01/2015, 11:22 AM        Time spent >45 mins
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
## 2015-05-01 NOTE — Telephone Encounter (Signed)
Unable to reach patient at time of TCM Call. Left message for patient to return call when available.  

## 2015-05-01 NOTE — Progress Notes (Signed)
Diabetes Coordinator paged to advise of patient admission.

## 2015-05-01 NOTE — Progress Notes (Signed)
    Subjective: Constant CP.  6/10.  Morphine helps a little.  Wants more meds.   Objective: Vital signs in last 24 hours: Temp:  [97.4 F (36.3 C)-97.8 F (36.6 C)] 97.8 F (36.6 C) (06/10 0400) Pulse Rate:  [65-94] 65 (06/10 0400) Resp:  [14-18] 16 (06/10 0400) BP: (114-171)/(69-108) 117/85 mmHg (06/10 0609) SpO2:  [96 %-98 %] 97 % (06/10 0400) Weight:  [202 lb 1.6 oz (91.672 kg)-213 lb (96.616 kg)] 202 lb 1.6 oz (91.672 kg) (06/10 0400) Last BM Date: 04/30/15  Intake/Output from previous day: 06/09 0701 - 06/10 0700 In: 240 [P.O.:240] Out: 200 [Urine:200] Intake/Output this shift:    Medications Scheduled Meds: . amLODipine  10 mg Oral Daily  . aspirin EC  325 mg Oral Daily  . atorvastatin  80 mg Oral q1800  . benazepril  40 mg Oral Daily  . DULoxetine  60 mg Oral Daily  . gabapentin  300 mg Oral TID  . insulin pump   Subcutaneous TID AC, HS, 0200  . metoprolol tartrate  25 mg Oral BID  . neomycin-polymyxin-hydrocortisone  4 drop Left Ear TID AC & HS  . pantoprazole  40 mg Oral BID   Continuous Infusions: . heparin 1,300 Units/hr (05/01/15 0040)   PRN Meds:.acetaminophen, diazepam, gi cocktail, hydrALAZINE, morphine injection, ondansetron (ZOFRAN) IV  PE: General appearance: alert, cooperative and no distress Lungs: clear to auscultation bilaterally Heart: regular rate and rhythm, S1, S2 normal, no murmur, click, rub or gallop Chest: nontender Extremities: No LEE Pulses: 2+ and symmetric Skin: Warm and dry Neurologic: Grossly normal  Lab Results:   Recent Labs  04/30/15 2000 05/01/15 0505  WBC 7.3 6.3  HGB 15.2 14.5  HCT 42.1 40.0  PLT 175 162   BMET  Recent Labs  04/30/15 2000 05/01/15 0505  NA 136 138  K 4.0 3.6  CL 104 104  CO2 22 23  GLUCOSE 295* 93  BUN 11 12  CREATININE 0.82 0.91  CALCIUM 9.5 8.9   Lipid Panel     Component Value Date/Time   CHOL 98 04/17/2015 1349   TRIG 108.0 04/17/2015 1349   HDL 37.70* 04/17/2015 1349   CHOLHDL 3 04/17/2015 1349   VLDL 21.6 04/17/2015 1349   LDLCALC 39 04/17/2015 1349       Assessment/Plan  Principal Problem:   Chest pain Active Problems:   Hyperlipidemia   Depression   Essential hypertension, benign   Type 1 diabetes mellitus with neurological manifestations, uncontrolled   Otitis, externa, infective   Ruled out for MI.  No Acute EKG changes.  Pain is atypical and appears worse with a deep breath.  No rub on exam.  Lipids look excellent.  He is on a statin at home.  Family history + for early MI.  He had lopressor 25 just after midnight.  He reports he may have mistakenly taken his son's ADHD med.  Lexiscan today.  BP well controlled.    HAGER, BRYAN PA-C 05/01/2015 7:38 AM  As above, ECG negative and enzymes normal; for nuclear study today; if negative, no plans for further ischemia eval Kirk Ruths

## 2015-05-01 NOTE — Progress Notes (Signed)
Patient was complaining of central chest pain, goes across chest to left shoulder 6/10. Pain has been present since in hospital and varies between 3-6/10. Has been getting Morphine IV for same and would like some now. See VS. Telemetry called and heart rate 81 NSR. Patient is pink, skin warm and dry and is not distressed. B Hager PA informed and Morphine 2mg  IV was given at [phone]. Pain is now 3.5/10. See VS. Sinus rhythm 60's.

## 2015-05-01 NOTE — Progress Notes (Signed)
Patient was diagnosed with diabetes at the age of 44 years old and he is followed by Dr. Dwyane Dee (Endocrinologist) for diabetes management. Patient uses Novolog insulin with his pump as an outpatient.   Patient saw Dr. Dwyane Dee last on 12/10/2014  Current insulin pump settings are as follows:  Basal insulin  12A 1.4  units/hour 07A 2.0  units/hour 13P 1.7  units/hour 22P 1.5  units/hour  Total daily basal insulin: 40.1 units/24 hours  Carb Coverage 1:4 1 unit for every 4 grams of carbohydrates  Insulin Sensitivity 1:15 1 unit drops blood glucose 15 mg/dl  Target Glucose Goals  110-140 mg/dl   Results for YIGIT, NORKUS (MRN [id number]) as of 05/01/2015 11:54  Ref. Range 09/11/2014 11:33 12/10/2014 15:46 03/26/2015 12:58  Hemoglobin A1C Unknown 7.9 (H) 9.2 (H) 8.1   Last A1C at Dr. Dwyane Dee was 9.2%, up from 7.9% previously.  Current A1c improved.  Thanks,  Tama Headings RN, MSN, [facility] Inpatient Diabetes Coordinator Team Pager [phone]

## 2015-05-01 NOTE — Progress Notes (Signed)
NP Baltazar Najjar notified that pt not experiencing pain relief from 2mg  morphine every 2 hours, will continue to monitor closely and await new orders.

## 2015-05-01 NOTE — Progress Notes (Signed)
Pt not experiencing pain relief from morphine 2mg  every 3 hours, new orders received from NP Baltazar Najjar for 2mg  Morphine every 2 hours.

## 2015-05-04 LAB — GLUCOSE, CAPILLARY: Glucose-Capillary: 117 mg/dL — ABNORMAL HIGH (ref 65–99)

## 2015-05-05 ENCOUNTER — Ambulatory Visit (INDEPENDENT_AMBULATORY_CARE_PROVIDER_SITE_OTHER): Payer: Self-pay | Admitting: Family Medicine

## 2015-05-05 VITALS — BP 144/100 | HR 91 | Ht 70.0 in | Wt 211.6 lb

## 2015-05-05 DIAGNOSIS — E1065 Type 1 diabetes mellitus with hyperglycemia: Principal | ICD-10-CM

## 2015-05-05 DIAGNOSIS — IMO0002 Reserved for concepts with insufficient information to code with codable children: Secondary | ICD-10-CM

## 2015-05-05 DIAGNOSIS — E1049 Type 1 diabetes mellitus with other diabetic neurological complication: Secondary | ICD-10-CM

## 2015-05-05 DIAGNOSIS — E1041 Type 1 diabetes mellitus with diabetic mononeuropathy: Secondary | ICD-10-CM

## 2015-05-05 NOTE — Progress Notes (Signed)
Subjective:  Patient presents today for 1 month diabetes follow-up as part of the employer-sponsored [employer] to Wellness program.  Current diabetes regimen includes Novolog via pump.  Patient also continues on daily ASA (varies from 162 to 325 mg), ACE Inhibitor and statin.  Most recent MD follow-up was 04/14/15.  Patient has a pending appt for 05/11/15. Patient very recently admitted to hospital and stayed overnight for cardiac workup; metoprolol tartrate 25mg  started at that time. Admission likely related to an unintentional consumption of Vyvanse.    Assessment/Plan:  Patient is a 44 y.o. male with DM 2. Most recent A1C was 8.1% which is exceeding goal of less than 7%. Weight is stable from last visit with me.   Lifestyle improvements:  Physical Activity-  Activity has not increased nor decreased. He continues to walk as able, depending on pain levels.  Nutrition-  Diet has not changed; still high in carbohydrate consumption off and on. Typically has several very high carbohydrate meals weekly.  Follow up with me in 1 month.   Goals for Next Visit:  1. See PCP Charlett Blake) for follow up related to recent ED visit and hospital admission. 2. Start metoprolol as advised by MD. Monitor blood pressure and heart rate, report to me or Dr. Charlett Blake results (same day if any readings below 60). 3. Being better portion control when cooking. Lacinda Axon what you need, not enough for leftovers. Lacinda Axon only enough for who will be eating. 4. Restart using sensor as comfortable/able/desired.

## 2015-05-05 NOTE — Patient Instructions (Signed)
1. See PCP Charlett Blake) for follow up related to recent ED visit and hospital admission. 2. Start metoprolol as advised by MD. Monitor blood pressure and heart rate, report to me or Dr. Charlett Blake results (same day if any readings below 60). 3. Being better portion control when cooking. Lacinda Axon what you need, not enough for leftovers. Lacinda Axon only enough for who will be eating. 4. Restart using sensor as comfortable/able/desired.

## 2015-05-06 LAB — NM MYOCAR MULTI W/SPECT W/WALL MOTION / EF
CHL CUP RESTING HR STRESS: 66 {beats}/min
LV dias vol: 97 mL
LV sys vol: 46 mL
NUC STRESS EF: 52 %
RATE: 0.3
SDS: 1
SRS: 4
SSS: 5
TID: 1.07

## 2015-05-11 ENCOUNTER — Ambulatory Visit (INDEPENDENT_AMBULATORY_CARE_PROVIDER_SITE_OTHER): Payer: 59 | Admitting: Physician Assistant

## 2015-05-11 ENCOUNTER — Encounter: Payer: Self-pay | Admitting: Physician Assistant

## 2015-05-11 VITALS — BP 127/86 | HR 105 | Temp 97.6°F | Resp 16 | Ht 70.0 in | Wt 207.1 lb

## 2015-05-11 DIAGNOSIS — R0789 Other chest pain: Secondary | ICD-10-CM

## 2015-05-11 MED ORDER — METOPROLOL SUCCINATE ER 25 MG PO TB24: 12.5000 mg | ORAL_TABLET | Freq: Every day | ORAL | Status: DC

## 2015-05-11 MED ORDER — TRAMADOL HCL 50 MG PO TABS: 50.0000 mg | ORAL_TABLET | ORAL | Status: DC | PRN

## 2015-05-11 NOTE — Patient Instructions (Signed)
Please continue medications as directed.  Remember to eat a bedtime snack to prevent very low morning sugars.    Try to increase your bedtime dosage of Gabapentin to 1200 mg to help with nighttime nerve pain.  Also start the Toprol XL daily to help with pulse and workload on the heart. Follow-up with Dr. Charlett Blake in 3-4 weeks.

## 2015-05-11 NOTE — Assessment & Plan Note (Signed)
Workup including telemetry and Lexiscan Myoview negative for cardiac cause of symptoms. Myoview did reveal mildly decreased EF of left ventricle.  Continue antihypertensive regimen.  Will add on Toprol XL 12.5 mg daily to help redeuce workload on heart and keep pulse in check.  Dietary measures reviewed.  Continue all other medications as directed. Follow-up 3-4 weeks.
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
## 2015-05-11 NOTE — Progress Notes (Signed)
Pre visit review using our clinic review tool, if applicable. No additional management support is needed unless otherwise documented below in the visit note/SLS  

## 2015-05-12 NOTE — Progress Notes (Signed)
ATTENDING PHYSICIAN NOTE: I have reviewed the chart and agree with the plan as detailed above. Sara Neal MD Pager [phone]  

## 2015-05-18 ENCOUNTER — Telehealth: Payer: Self-pay | Admitting: Family Medicine

## 2015-05-18 NOTE — Telephone Encounter (Signed)
Caller name: Donavin Relation to pt: Call back number: [phone] Pharmacy: [facility]  Reason for call:   Patient is wanting to know if Dr. Charlett Blake would prescribe him chantex to help him quit smoking

## 2015-05-18 NOTE — Telephone Encounter (Signed)
Since this would be a new prescription, he will either need to come in for visit or wait for Dr. Charlett Blake to return to make this decision

## 2015-05-19 ENCOUNTER — Other Ambulatory Visit: Payer: Self-pay | Admitting: Family Medicine

## 2015-05-19 NOTE — Telephone Encounter (Signed)
Patient informed of response to question and he will wait to schedule, at this time he is trying to stop on his own and wanted chantix to have in case needed.

## 2015-06-10 ENCOUNTER — Ambulatory Visit (INDEPENDENT_AMBULATORY_CARE_PROVIDER_SITE_OTHER): Payer: 59 | Admitting: Physician Assistant

## 2015-06-10 ENCOUNTER — Encounter: Payer: Self-pay | Admitting: Physician Assistant

## 2015-06-10 VITALS — BP 138/98 | HR 88 | Temp 98.1°F | Ht 70.0 in | Wt 208.2 lb

## 2015-06-10 DIAGNOSIS — I10 Essential (primary) hypertension: Secondary | ICD-10-CM

## 2015-06-10 DIAGNOSIS — Z716 Tobacco abuse counseling: Secondary | ICD-10-CM | POA: Diagnosis not present

## 2015-06-10 MED ORDER — GLUCOSE BLOOD VI STRP: ORAL_STRIP | Status: DC

## 2015-06-10 MED ORDER — TRAMADOL HCL 50 MG PO TABS: 50.0000 mg | ORAL_TABLET | ORAL | Status: DC | PRN

## 2015-06-10 MED ORDER — VARENICLINE TARTRATE 0.5 MG PO TABS: 0.5000 mg | ORAL_TABLET | Freq: Every day | ORAL | Status: DC

## 2015-06-10 MED ORDER — BAYER MICROLET LANCETS MISC: Status: AC

## 2015-06-10 NOTE — Patient Instructions (Signed)
Please continue medications as directed, including the Toprol-XL.  Start the low dose of Chantix for smoking cessation. Follow-up with Dr. Charlett Blake in 1 month. If you notice any change in mood or sleep, please stop the medication and call us.

## 2015-06-10 NOTE — Progress Notes (Signed)
Pre visit review using our clinic review tool, if applicable. No additional management support is needed unless otherwise documented below in the visit note. 
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
## 2015-06-14 DIAGNOSIS — Z716 Tobacco abuse counseling: Secondary | ICD-10-CM | POA: Insufficient documentation

## 2015-06-14 NOTE — Assessment & Plan Note (Signed)
Blood pressure stable with addition of Toprol-XL. Asymptomatic. Continue current regimen. Follow-up with PCP as scheduled.

## 2015-06-14 NOTE — Assessment & Plan Note (Signed)
Treatment options discussed. Patient is felt multidrug regimens. Discussed potential side effects of Chantix in patient with history of anxiety and depression. Agree with pharmacist that once daily starting dose would offer benefits outweighing any risk. We'll start Rx. Potential side effects discussed with patient, who is to stop medication if any of these were to occur. Otherwise follow-up in one month.

## 2015-06-17 ENCOUNTER — Other Ambulatory Visit: Payer: Self-pay | Admitting: Family Medicine

## 2015-06-17 NOTE — Telephone Encounter (Signed)
Go ahead and refill his diazepam with the same strength, same sig, same number, 1 rf but please see if you can flag his chart so when he comes in for his appt in September we get him to sign a contract and perform a UDS

## 2015-06-17 NOTE — Telephone Encounter (Signed)
Completed and chart marked

## 2015-06-17 NOTE — Telephone Encounter (Signed)
Diazepam Last OV- 04/15/15 Ordered- 03/17/15 No controlled substance contract  Please advise

## 2015-06-19 ENCOUNTER — Telehealth: Payer: Self-pay | Admitting: *Deleted

## 2015-06-19 ENCOUNTER — Encounter: Payer: Self-pay | Admitting: Family Medicine

## 2015-06-19 NOTE — Telephone Encounter (Signed)
Requesting Gabapentin 300mg -Take 3 capsules by mouth three times daily. Last refill:05/19/15;270,0 Last OV:06/10/15 Please advise.//AB/CMA

## 2015-06-19 NOTE — Telephone Encounter (Signed)
Rx faxed to [place] and patient notified via voicemail.

## 2015-06-21 NOTE — Telephone Encounter (Signed)
OK to refill the Gabapentin same sig, same number. No refills

## 2015-06-22 MED ORDER — GABAPENTIN 300 MG PO CAPS: ORAL_CAPSULE | ORAL | Status: DC

## 2015-06-22 NOTE — Telephone Encounter (Signed)
Rx sent 

## 2015-06-23 MED ORDER — DIAZEPAM 5 MG PO TABS: ORAL_TABLET | ORAL | Status: DC

## 2015-06-25 ENCOUNTER — Encounter: Payer: Self-pay | Admitting: Family Medicine

## 2015-06-26 ENCOUNTER — Other Ambulatory Visit: Payer: Self-pay | Admitting: Family Medicine

## 2015-06-26 MED ORDER — VARENICLINE TARTRATE 0.5 MG PO TABS: ORAL_TABLET | ORAL | Status: DC

## 2015-06-26 MED ORDER — VARENICLINE TARTRATE 1 MG PO TABS: 1.0000 mg | ORAL_TABLET | Freq: Two times a day (BID) | ORAL | Status: DC

## 2015-07-03 ENCOUNTER — Telehealth: Payer: Self-pay | Admitting: Endocrinology

## 2015-07-03 ENCOUNTER — Other Ambulatory Visit: Payer: Self-pay | Admitting: *Deleted

## 2015-07-03 MED ORDER — INSULIN ASPART 100 UNIT/ML ~~LOC~~ SOLN: SUBCUTANEOUS | Status: DC

## 2015-07-03 NOTE — Telephone Encounter (Signed)
Patient called and would like a refill of jis Rx  Rx: Novolog    Pharmacy: [facility]   Thank You

## 2015-07-03 NOTE — Telephone Encounter (Signed)
Patient called stating that his Rx was sent incorrectly  Rx: 140-150 units daily   Thank you

## 2015-07-03 NOTE — Telephone Encounter (Signed)
rx sent for 30 day supply only, patient must keep appointment

## 2015-07-03 NOTE — Telephone Encounter (Signed)
rx corrected and sent

## 2015-07-06 ENCOUNTER — Other Ambulatory Visit: Payer: Self-pay | Admitting: Family Medicine

## 2015-07-06 ENCOUNTER — Other Ambulatory Visit: Payer: Self-pay | Admitting: Physician Assistant

## 2015-07-07 ENCOUNTER — Other Ambulatory Visit: Payer: Self-pay | Admitting: Family Medicine

## 2015-07-07 MED ORDER — TRAMADOL HCL 50 MG PO TABS: 50.0000 mg | ORAL_TABLET | ORAL | Status: DC | PRN

## 2015-07-07 NOTE — Telephone Encounter (Signed)
Printed tramadol for PCP to sign per mychart request.  Will call the patient to pickup

## 2015-07-08 ENCOUNTER — Ambulatory Visit (INDEPENDENT_AMBULATORY_CARE_PROVIDER_SITE_OTHER): Payer: Self-pay | Admitting: Family Medicine

## 2015-07-08 VITALS — BP 112/75 | HR 76 | Ht 70.0 in | Wt 205.4 lb

## 2015-07-08 DIAGNOSIS — E118 Type 2 diabetes mellitus with unspecified complications: Secondary | ICD-10-CM

## 2015-07-08 NOTE — Patient Instructions (Signed)
1. Start using sensor reliably. Do what it takes to be able to do so. Try alternate sites if bleeding occurs at near-belly-button site. Consider farther out on stomach, hip, lower thigh. Reduce aspirin frequency if necessary.  2. Continue to increase exercise as able. Increase intensity if possible. Stay hydrated!  3. Continue great work on portion control and cooking portion control.

## 2015-07-08 NOTE — Progress Notes (Signed)
Subjective:  Patient presents today for 3 month diabetes follow-up as part of the employer-sponsored [employer] to Wellness program.  Current diabetes regimen includes Novolog via pump.  Patient also continues on daily ASA (previously 325 mg, now 81mg  as allowed by bleeding at sensor site), ARB, atorvastatin.  Most recent MD follow-up was 06/10/15.  Patient has a pending appt for 07/28/15  Assessment/Plan:  Patient is a 44 y.o. male with DM 2. Most recent A1C was 8.1% which is exceeding goal of  <7%. Weight is decreased 6.4 lbs since last visit.   Lifestyle improvements:  Physical Activity-  Has increased activity since last visit; now walking down to [employer] (2 miles round trip) 3-4 times a week. Previously limited by ankle and other leg pain.   Nutrition- Has improved portion control considerably. Now cooks with appropriate portions and therefore doesn't feel compelled to eat more than he should. For example, has reduced biscuit cooking from 24 to 8 (for a family of 3). Previously had been cooking so they would have leftovers.  Goals for Next Visit:   1. Start using sensor reliably. Do what it takes to be able to do so. Try alternate sites if bleeding occurs at near-belly-button site. Consider farther out on stomach, hip, lower thigh. Reduce aspirin frequency if necessary.  2. Continue to increase exercise as able. Increase intensity if possible. Stay hydrated! 3. Continue great work on portion control and cooking portion control.    Next appointment to see me is: 08/05/15 @ 3:30PM

## 2015-07-16 NOTE — Progress Notes (Signed)
ATTENDING PHYSICIAN NOTE: I have reviewed the chart and agree with the plan as detailed above. Ugo Thoma MD Pager [phone]  

## 2015-07-17 ENCOUNTER — Ambulatory Visit (INDEPENDENT_AMBULATORY_CARE_PROVIDER_SITE_OTHER): Payer: 59 | Admitting: Family Medicine

## 2015-07-17 VITALS — BP 128/80 | HR 90 | Temp 97.5°F | Resp 16 | Ht 70.0 in | Wt 203.0 lb

## 2015-07-17 DIAGNOSIS — K029 Dental caries, unspecified: Secondary | ICD-10-CM

## 2015-07-17 DIAGNOSIS — K088 Other specified disorders of teeth and supporting structures: Secondary | ICD-10-CM | POA: Diagnosis not present

## 2015-07-17 DIAGNOSIS — K0510 Chronic gingivitis, plaque induced: Secondary | ICD-10-CM | POA: Diagnosis not present

## 2015-07-17 MED ORDER — LIDOCAINE VISCOUS 2 % MT SOLN: 5.0000 mL | OROMUCOSAL | Status: DC | PRN

## 2015-07-17 MED ORDER — HYDROCODONE-ACETAMINOPHEN 5-325 MG PO TABS: 1.0000 | ORAL_TABLET | Freq: Four times a day (QID) | ORAL | Status: DC | PRN

## 2015-07-17 MED ORDER — AMOXICILLIN 875 MG PO TABS: 875.0000 mg | ORAL_TABLET | Freq: Two times a day (BID) | ORAL | Status: DC
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
## 2015-07-21 ENCOUNTER — Other Ambulatory Visit: Payer: Self-pay | Admitting: Family Medicine

## 2015-07-21 MED ORDER — GABAPENTIN 300 MG PO CAPS: ORAL_CAPSULE | ORAL | Status: DC

## 2015-07-21 NOTE — Telephone Encounter (Signed)
Pt called in. Did not mean to request refill on tramadol. He just had it filled. He is needing a refill on gabapentin. He has 15-20 left. Takes 9/day. Please send to [facility].

## 2015-07-21 NOTE — Addendum Note (Signed)
Addended by: Sharon Seller B on: 07/21/2015 01:30 PM   Modules accepted: Orders

## 2015-07-23 ENCOUNTER — Other Ambulatory Visit: Payer: Self-pay | Admitting: Family Medicine

## 2015-07-23 MED ORDER — GABAPENTIN 300 MG PO CAPS: ORAL_CAPSULE | ORAL | Status: DC

## 2015-07-28 ENCOUNTER — Encounter: Payer: Self-pay | Admitting: Family Medicine

## 2015-07-28 ENCOUNTER — Ambulatory Visit (INDEPENDENT_AMBULATORY_CARE_PROVIDER_SITE_OTHER): Payer: 59 | Admitting: Family Medicine

## 2015-07-28 VITALS — BP 118/80 | HR 79 | Temp 98.1°F | Ht 70.0 in | Wt 204.5 lb

## 2015-07-28 DIAGNOSIS — E291 Testicular hypofunction: Secondary | ICD-10-CM

## 2015-07-28 DIAGNOSIS — E1049 Type 1 diabetes mellitus with other diabetic neurological complication: Secondary | ICD-10-CM

## 2015-07-28 DIAGNOSIS — E785 Hyperlipidemia, unspecified: Secondary | ICD-10-CM | POA: Diagnosis not present

## 2015-07-28 DIAGNOSIS — E1041 Type 1 diabetes mellitus with diabetic mononeuropathy: Secondary | ICD-10-CM | POA: Diagnosis not present

## 2015-07-28 DIAGNOSIS — IMO0002 Reserved for concepts with insufficient information to code with codable children: Secondary | ICD-10-CM

## 2015-07-28 DIAGNOSIS — I10 Essential (primary) hypertension: Secondary | ICD-10-CM

## 2015-07-28 DIAGNOSIS — Z23 Encounter for immunization: Secondary | ICD-10-CM

## 2015-07-28 DIAGNOSIS — Z716 Tobacco abuse counseling: Secondary | ICD-10-CM

## 2015-07-28 DIAGNOSIS — R7989 Other specified abnormal findings of blood chemistry: Secondary | ICD-10-CM

## 2015-07-28 DIAGNOSIS — E1065 Type 1 diabetes mellitus with hyperglycemia: Secondary | ICD-10-CM

## 2015-07-28 MED ORDER — NICOTINE 14 MG/24HR TD PT24: 14.0000 mg | MEDICATED_PATCH | Freq: Every day | TRANSDERMAL | Status: DC

## 2015-07-28 MED ORDER — HYDROCODONE-ACETAMINOPHEN 10-325 MG PO TABS
1.0000 | ORAL_TABLET | Freq: Four times a day (QID) | ORAL | Status: DC | PRN
Start: 2015-07-28 — End: 2015-12-22

## 2015-07-28 MED ORDER — NICOTINE 7 MG/24HR TD PT24: 7.0000 mg | MEDICATED_PATCH | Freq: Every day | TRANSDERMAL | Status: DC

## 2015-07-28 NOTE — Patient Instructions (Signed)

## 2015-07-28 NOTE — Progress Notes (Signed)
Pre visit review using our clinic review tool, if applicable. No additional management support is needed unless otherwise documented below in the visit note. 

## 2015-07-29 ENCOUNTER — Other Ambulatory Visit (INDEPENDENT_AMBULATORY_CARE_PROVIDER_SITE_OTHER): Payer: 59

## 2015-07-29 ENCOUNTER — Ambulatory Visit (INDEPENDENT_AMBULATORY_CARE_PROVIDER_SITE_OTHER): Payer: Self-pay | Admitting: Family Medicine

## 2015-07-29 ENCOUNTER — Encounter: Payer: Self-pay | Admitting: Pharmacist

## 2015-07-29 VITALS — BP 132/86 | Ht 70.0 in | Wt 208.2 lb

## 2015-07-29 DIAGNOSIS — I10 Essential (primary) hypertension: Secondary | ICD-10-CM | POA: Diagnosis not present

## 2015-07-29 DIAGNOSIS — E291 Testicular hypofunction: Secondary | ICD-10-CM

## 2015-07-29 DIAGNOSIS — E1041 Type 1 diabetes mellitus with diabetic mononeuropathy: Secondary | ICD-10-CM

## 2015-07-29 DIAGNOSIS — E785 Hyperlipidemia, unspecified: Secondary | ICD-10-CM | POA: Diagnosis not present

## 2015-07-29 DIAGNOSIS — IMO0002 Reserved for concepts with insufficient information to code with codable children: Secondary | ICD-10-CM

## 2015-07-29 DIAGNOSIS — E1049 Type 1 diabetes mellitus with other diabetic neurological complication: Secondary | ICD-10-CM

## 2015-07-29 DIAGNOSIS — E108 Type 1 diabetes mellitus with unspecified complications: Secondary | ICD-10-CM

## 2015-07-29 DIAGNOSIS — E1065 Type 1 diabetes mellitus with hyperglycemia: Principal | ICD-10-CM

## 2015-07-29 DIAGNOSIS — R7989 Other specified abnormal findings of blood chemistry: Secondary | ICD-10-CM

## 2015-07-29 NOTE — Patient Outreach (Signed)
1. Start using Nicoderm (I will special order this for you). 2. Discontinue use of excedrin while using hydrocodone/apap if at all possible. Attempt to start using sensor again as allowed by blood pooling. 3. Continue to try new sensor sites as previously discussed and attempted.

## 2015-07-29 NOTE — Progress Notes (Addendum)
Subjective:  Patient presents today for 3 month diabetes follow-up as part of the employer-sponsored [employer] to Wellness program.  Current diabetes regimen includes Novolog via pump. Patient has discontinued ASA but still takes Excedrin ~3 pills twice weekly; to discontinue. Also on ARB and statin. Most recent follow up was yesterday, 07/28/15. No significant med changes at this time.  Assessment/Plan:  Patient is a 44 y.o. male with DM 1. Most recent A1C was  8.1% which is exceeding goal of less than 7%. Weight is increased 3 lbs from last visit with me.   Lifestyle improvements:  Physical Activity-  No significant change in physical activity since last visit. Continues to walk as much as possible. Reports up to 4 days per week of at least 30 minutes.  Nutrition-  Has had a soft diet lately due to mouth pain. Spaghetti and pudding are common with an occasional sandwich. Eating only once daily, but appears to be eating large quantities when eating based on weight increase.  Follow up with me in 3 months.    Goals for Next Visit:  1. Start using Nicoderm (I will special order this for you). 2. Discontinue use of excedrin while using hydrocodone/apap if at all possible. Attempt to start using sensor again as allowed by blood pooling. 3. Continue to try new sensor sites as previously discussed and attempted. F/u as directed

## 2015-07-30 ENCOUNTER — Other Ambulatory Visit: Payer: Self-pay | Admitting: Family Medicine

## 2015-07-30 LAB — COMPREHENSIVE METABOLIC PANEL
ALK PHOS: 69 U/L (ref 39–117)
ALT: 14 U/L (ref 0–53)
AST: 18 U/L (ref 0–37)
Albumin: 4.3 g/dL (ref 3.5–5.2)
BILIRUBIN TOTAL: 0.5 mg/dL (ref 0.2–1.2)
BUN: 13 mg/dL (ref 6–23)
CALCIUM: 9.2 mg/dL (ref 8.4–10.5)
CO2: 30 mEq/L (ref 19–32)
Chloride: 105 mEq/L (ref 96–112)
Creatinine, Ser: 0.9 mg/dL (ref 0.40–1.50)
GFR: 97.13 mL/min (ref >60.00)
GLUCOSE: 96 mg/dL (ref 70–99)
Potassium: 3.7 mEq/L (ref 3.5–5.1)
Sodium: 143 mEq/L (ref 135–145)
TOTAL PROTEIN: 6.8 g/dL (ref 6.0–8.3)

## 2015-07-30 LAB — LIPID PANEL
CHOLESTEROL: 80 mg/dL (ref 0–200)
HDL: 37.2 mg/dL — AB (ref >39.00)
LDL Cholesterol: 22 mg/dL (ref 0–99)
NonHDL: 43.28
TRIGLYCERIDES: 106 mg/dL (ref 0.0–149.0)
Total CHOL/HDL Ratio: 2
VLDL: 21.2 mg/dL (ref 0.0–40.0)

## 2015-07-30 LAB — HEMOGLOBIN A1C: HEMOGLOBIN A1C: 8 % — AB (ref 4.6–6.5)

## 2015-07-30 LAB — CBC
HCT: 40 % (ref 39.0–52.0)
Hemoglobin: 13.8 g/dL (ref 13.0–17.0)
MCHC: 34.5 g/dL (ref 30.0–36.0)
MCV: 92.1 fl (ref 78.0–100.0)
Platelets: 167 10*3/uL (ref 150.0–400.0)
RBC: 4.34 Mil/uL (ref 4.22–5.81)
RDW: 13.7 % (ref 11.5–15.5)
WBC: 5.6 10*3/uL (ref 4.0–10.5)

## 2015-07-30 LAB — TSH: TSH: 1.48 u[IU]/mL (ref 0.35–4.50)

## 2015-07-30 LAB — TESTOSTERONE: Testosterone: 140.65 ng/dL — ABNORMAL LOW (ref 300.00–890.00)

## 2015-07-30 MED ORDER — TESTOSTERONE 20.25 MG/ACT (1.62%) TD GEL: TRANSDERMAL | Status: DC

## 2015-08-03 ENCOUNTER — Telehealth: Payer: Self-pay | Admitting: *Deleted

## 2015-08-03 NOTE — Telephone Encounter (Signed)
PA for AndroGel initiated. Awaiting determination. JG//CMA

## 2015-08-04 NOTE — Progress Notes (Signed)
Patient ID: [name], male   DOB: 03-11-1971, 44 y.o.   MRN: [id number] ATTENDING PHYSICIAN NOTE: I have reviewed the chart and agree with the plan as detailed above. Dorcas Mcmurray MD Pager [phone]

## 2015-08-09 ENCOUNTER — Encounter: Payer: Self-pay | Admitting: Family Medicine

## 2015-08-09 NOTE — Assessment & Plan Note (Signed)
Encouraged complete cessation. Discussed need to quit as relates to risk of numerous cancers, cardiac and pulmonary disease as well as neurologic complications. Counseled for greater than 3 minutes. Given prescriptions to use Nicotine patch

## 2015-08-09 NOTE — Assessment & Plan Note (Signed)
Well controlled, no changes to meds. Encouraged heart healthy diet such as the DASH diet and exercise as tolerated.  °

## 2015-08-09 NOTE — Assessment & Plan Note (Signed)
Started on Androgel, patient aware of risks and benefits.

## 2015-08-09 NOTE — Assessment & Plan Note (Signed)
Tolerating statin, encouraged heart healthy diet, avoid trans fats, minimize simple carbs and saturated fats. Increase exercise as tolerated 

## 2015-08-09 NOTE — Assessment & Plan Note (Signed)
A1C 8.0 is following with endocrinology. hgba1c borderline acceptable, minimize simple carbs. Increase exercise as tolerated. Continue current meds, numbers have been improving
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
## 2015-08-11 NOTE — Telephone Encounter (Signed)
Approved 08/03/15 through 01/22/2016. Case #: [id number]. Approval letter sent for scanning. [id number]

## 2015-08-12 ENCOUNTER — Encounter: Payer: Self-pay | Admitting: Endocrinology

## 2015-08-12 ENCOUNTER — Ambulatory Visit (INDEPENDENT_AMBULATORY_CARE_PROVIDER_SITE_OTHER): Payer: 59 | Admitting: Endocrinology

## 2015-08-12 ENCOUNTER — Other Ambulatory Visit: Payer: Self-pay | Admitting: *Deleted

## 2015-08-12 VITALS — BP 136/94 | HR 82 | Temp 98.4°F | Resp 16 | Ht 70.0 in | Wt 205.6 lb

## 2015-08-12 DIAGNOSIS — IMO0002 Reserved for concepts with insufficient information to code with codable children: Secondary | ICD-10-CM

## 2015-08-12 DIAGNOSIS — E1041 Type 1 diabetes mellitus with diabetic mononeuropathy: Secondary | ICD-10-CM | POA: Diagnosis not present

## 2015-08-12 DIAGNOSIS — E1065 Type 1 diabetes mellitus with hyperglycemia: Principal | ICD-10-CM

## 2015-08-12 DIAGNOSIS — E291 Testicular hypofunction: Secondary | ICD-10-CM

## 2015-08-12 DIAGNOSIS — E1049 Type 1 diabetes mellitus with other diabetic neurological complication: Secondary | ICD-10-CM

## 2015-08-12 LAB — T4, FREE: Free T4: 0.82 ng/dL (ref 0.60–1.60)

## 2015-08-12 LAB — BASIC METABOLIC PANEL
BUN: 12 mg/dL (ref 6–23)
CHLORIDE: 107 meq/L (ref 96–112)
CO2: 27 meq/L (ref 19–32)
CREATININE: 0.89 mg/dL (ref 0.40–1.50)
Calcium: 9.2 mg/dL (ref 8.4–10.5)
GFR: 98.38 mL/min (ref >60.00)
Glucose, Bld: 238 mg/dL — ABNORMAL HIGH (ref 70–99)
Potassium: 3.9 mEq/L (ref 3.5–5.1)
Sodium: 140 mEq/L (ref 135–145)

## 2015-08-12 LAB — HEMOGLOBIN A1C: HEMOGLOBIN A1C: 7.6 % — AB (ref 4.6–6.5)

## 2015-08-12 MED ORDER — INSULIN ASPART 100 UNIT/ML ~~LOC~~ SOLN: SUBCUTANEOUS | Status: DC
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
## 2015-08-13 ENCOUNTER — Other Ambulatory Visit: Payer: Self-pay | Admitting: Family Medicine

## 2015-08-13 LAB — PROLACTIN: PROLACTIN: 6.8 ng/mL (ref 2.1–17.1)

## 2015-08-13 LAB — LUTEINIZING HORMONE: LH: 4.7 m[IU]/mL (ref 1.50–9.30)

## 2015-08-13 MED ORDER — DIAZEPAM 5 MG PO TABS
ORAL_TABLET | ORAL | Status: DC
Start: 2015-08-13 — End: 2015-09-17

## 2015-08-13 NOTE — Telephone Encounter (Signed)
Requesting: Valium Contract  Signed on 08/14/14 UDS   Moderate Last OV   07/28/15 Last Refill    #60 with 1 refills on 06/23/15  Please Advise

## 2015-08-13 NOTE — Progress Notes (Signed)
Quick Note:  Please let patient know that the pituitary gland tests are normal, continue AndroGel  ______

## 2015-08-13 NOTE — Telephone Encounter (Signed)
Faxed hardcopy for Valium to [place]

## 2015-08-15 ENCOUNTER — Emergency Department (HOSPITAL_BASED_OUTPATIENT_CLINIC_OR_DEPARTMENT_OTHER): Payer: 59

## 2015-08-15 ENCOUNTER — Encounter (HOSPITAL_BASED_OUTPATIENT_CLINIC_OR_DEPARTMENT_OTHER): Payer: Self-pay | Admitting: Emergency Medicine

## 2015-08-15 ENCOUNTER — Inpatient Hospital Stay (HOSPITAL_BASED_OUTPATIENT_CLINIC_OR_DEPARTMENT_OTHER)
Admission: EM | Admit: 2015-08-15 | Discharge: 2015-08-16 | DRG: 313 | Disposition: A | Discharge status: Home / Self Care | Payer: 59 | Attending: Internal Medicine | Admitting: Internal Medicine

## 2015-08-15 DIAGNOSIS — Z800 Family history of malignant neoplasm of digestive organs: Secondary | ICD-10-CM

## 2015-08-15 DIAGNOSIS — M549 Dorsalgia, unspecified: Secondary | ICD-10-CM | POA: Diagnosis present

## 2015-08-15 DIAGNOSIS — F17210 Nicotine dependence, cigarettes, uncomplicated: Secondary | ICD-10-CM | POA: Diagnosis present

## 2015-08-15 DIAGNOSIS — R7989 Other specified abnormal findings of blood chemistry: Secondary | ICD-10-CM | POA: Diagnosis present

## 2015-08-15 DIAGNOSIS — Z823 Family history of stroke: Secondary | ICD-10-CM

## 2015-08-15 DIAGNOSIS — R079 Chest pain, unspecified: Secondary | ICD-10-CM | POA: Diagnosis not present

## 2015-08-15 DIAGNOSIS — R0789 Other chest pain: Secondary | ICD-10-CM | POA: Diagnosis not present

## 2015-08-15 DIAGNOSIS — E042 Nontoxic multinodular goiter: Secondary | ICD-10-CM | POA: Diagnosis present

## 2015-08-15 DIAGNOSIS — Z8249 Family history of ischemic heart disease and other diseases of the circulatory system: Secondary | ICD-10-CM

## 2015-08-15 DIAGNOSIS — K219 Gastro-esophageal reflux disease without esophagitis: Secondary | ICD-10-CM | POA: Diagnosis present

## 2015-08-15 DIAGNOSIS — G8929 Other chronic pain: Secondary | ICD-10-CM | POA: Diagnosis present

## 2015-08-15 DIAGNOSIS — E291 Testicular hypofunction: Secondary | ICD-10-CM | POA: Diagnosis not present

## 2015-08-15 DIAGNOSIS — E1065 Type 1 diabetes mellitus with hyperglycemia: Secondary | ICD-10-CM | POA: Diagnosis present

## 2015-08-15 DIAGNOSIS — I10 Essential (primary) hypertension: Secondary | ICD-10-CM | POA: Diagnosis not present

## 2015-08-15 DIAGNOSIS — E1049 Type 1 diabetes mellitus with other diabetic neurological complication: Secondary | ICD-10-CM | POA: Diagnosis present

## 2015-08-15 DIAGNOSIS — Z716 Tobacco abuse counseling: Secondary | ICD-10-CM

## 2015-08-15 DIAGNOSIS — E1142 Type 2 diabetes mellitus with diabetic polyneuropathy: Secondary | ICD-10-CM | POA: Diagnosis present

## 2015-08-15 DIAGNOSIS — E876 Hypokalemia: Secondary | ICD-10-CM | POA: Diagnosis present

## 2015-08-15 DIAGNOSIS — IMO0002 Reserved for concepts with insufficient information to code with codable children: Secondary | ICD-10-CM | POA: Diagnosis present

## 2015-08-15 DIAGNOSIS — Z794 Long term (current) use of insulin: Secondary | ICD-10-CM

## 2015-08-15 DIAGNOSIS — Z833 Family history of diabetes mellitus: Secondary | ICD-10-CM

## 2015-08-15 DIAGNOSIS — E780 Pure hypercholesterolemia: Secondary | ICD-10-CM | POA: Diagnosis present

## 2015-08-15 DIAGNOSIS — G2581 Restless legs syndrome: Secondary | ICD-10-CM | POA: Diagnosis present

## 2015-08-15 DIAGNOSIS — E1041 Type 1 diabetes mellitus with diabetic mononeuropathy: Secondary | ICD-10-CM

## 2015-08-15 DIAGNOSIS — E785 Hyperlipidemia, unspecified: Secondary | ICD-10-CM | POA: Diagnosis present

## 2015-08-15 DIAGNOSIS — E1042 Type 1 diabetes mellitus with diabetic polyneuropathy: Secondary | ICD-10-CM | POA: Diagnosis present

## 2015-08-15 DIAGNOSIS — F329 Major depressive disorder, single episode, unspecified: Secondary | ICD-10-CM | POA: Diagnosis present

## 2015-08-15 DIAGNOSIS — F32A Depression, unspecified: Secondary | ICD-10-CM | POA: Diagnosis present

## 2015-08-15 DIAGNOSIS — Z9641 Presence of insulin pump (external) (internal): Secondary | ICD-10-CM | POA: Diagnosis present

## 2015-08-15 LAB — COMPREHENSIVE METABOLIC PANEL
ALT: 16 U/L — AB (ref 17–63)
ANION GAP: 9 (ref 5–15)
AST: 19 U/L (ref 15–41)
Albumin: 4.2 g/dL (ref 3.5–5.0)
Alkaline Phosphatase: 68 U/L (ref 38–126)
BUN: 8 mg/dL (ref 6–20)
CHLORIDE: 104 mmol/L (ref 101–111)
CO2: 26 mmol/L (ref 22–32)
Calcium: 9.2 mg/dL (ref 8.9–10.3)
Creatinine, Ser: 0.86 mg/dL (ref 0.61–1.24)
GFR calc non Af Amer: >60 mL/min (ref >60)
Glucose, Bld: 208 mg/dL — ABNORMAL HIGH (ref 65–99)
POTASSIUM: 3.3 mmol/L — AB (ref 3.5–5.1)
SODIUM: 139 mmol/L (ref 135–145)
Total Bilirubin: 0.9 mg/dL (ref 0.3–1.2)
Total Protein: 6.9 g/dL (ref 6.5–8.1)

## 2015-08-15 LAB — RAPID URINE DRUG SCREEN, HOSP PERFORMED
AMPHETAMINES: NOT DETECTED
BENZODIAZEPINES: POSITIVE — AB
Barbiturates: NOT DETECTED
Cocaine: NOT DETECTED
OPIATES: POSITIVE — AB
Tetrahydrocannabinol: NOT DETECTED

## 2015-08-15 LAB — CBG MONITORING, ED
GLUCOSE-CAPILLARY: 214 mg/dL — AB (ref 65–99)
Glucose-Capillary: 71 mg/dL (ref 65–99)

## 2015-08-15 LAB — CBC
HCT: 38.8 % — ABNORMAL LOW (ref 39.0–52.0)
Hemoglobin: 13.7 g/dL (ref 13.0–17.0)
MCH: 31.2 pg (ref 26.0–34.0)
MCHC: 35.3 g/dL (ref 30.0–36.0)
MCV: 88.4 fL (ref 78.0–100.0)
PLATELETS: 146 10*3/uL — AB (ref 150–400)
RBC: 4.39 MIL/uL (ref 4.22–5.81)
RDW: 13 % (ref 11.5–15.5)
WBC: 6.6 10*3/uL (ref 4.0–10.5)

## 2015-08-15 LAB — TROPONIN I: Troponin I: <0.03 ng/mL (ref <0.031)

## 2015-08-15 MED ORDER — ONDANSETRON HCL 4 MG/2ML IJ SOLN
4.0000 mg | Freq: Once | INTRAMUSCULAR | Status: AC
  Administered 2015-08-15: 4 mg via INTRAVENOUS
  Filled 2015-08-15: qty 2

## 2015-08-15 MED ORDER — FENTANYL CITRATE (PF) 100 MCG/2ML IJ SOLN
100.0000 ug | Freq: Once | INTRAMUSCULAR | Status: AC
  Administered 2015-08-15: 100 ug via INTRAVENOUS
  Filled 2015-08-15: qty 2

## 2015-08-15 MED ORDER — KETOROLAC TROMETHAMINE 30 MG/ML IJ SOLN
30.0000 mg | Freq: Once | INTRAMUSCULAR | Status: AC
  Administered 2015-08-15: 30 mg via INTRAVENOUS
  Filled 2015-08-15: qty 1

## 2015-08-15 MED ORDER — ONDANSETRON HCL 4 MG/2ML IJ SOLN: 4.0000 mg | Freq: Three times a day (TID) | INTRAMUSCULAR | Status: AC | PRN

## 2015-08-15 MED ORDER — MORPHINE SULFATE (PF) 4 MG/ML IV SOLN
4.0000 mg | Freq: Once | INTRAVENOUS | Status: AC
  Administered 2015-08-15: 4 mg via INTRAVENOUS
  Filled 2015-08-15: qty 1

## 2015-08-15 NOTE — ED Notes (Signed)
Patient called out to request additional pain medication x 2. Discussed with PA that pt requesting additional pain medication , rates 7/10 in lt chest and lt jaw. No new orders received. Updated the patient during hourly rounding at 2020. Offered to obtain ice water or another PO drink, pt refused, reports frustration and states" Ive got a bottle of pain meds at home that would kill this pain".
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
## 2015-08-15 NOTE — ED Notes (Signed)
PA at bedside, pt reports that blood sugars have" been up and down" reports this am it was in the 70 then got into the icing for the cake he was making and was over 300. Additional assessment reports no swelling, N/V/D, fevers , aches , and body chills.

## 2015-08-15 NOTE — ED Notes (Signed)
Pt reports morphine helped with pain, requesting more narcotic for pain to reduce the pain. Reports tooth pain chronic and planned seeing a density on oct 5th.

## 2015-08-15 NOTE — ED Notes (Signed)
Patient states that he was baking a cake about 1 hour ago and started to have chest pain and jaw pain with pain down his left arm

## 2015-08-15 NOTE — ED Notes (Signed)
Pt states he feels his blood sugar is "lower" 71 CBG. Pt requested a snack. Peanut butter and graham crackers given and diet coke per his request. Pt has an insulin pump. States he has turned it off at this time for 30 minutes. [facility] updated.

## 2015-08-15 NOTE — ED Notes (Signed)
Spoke with PA and he is aware that patient is still c/o anterior chest and left shoulder pain. Pt denies any sob at present. VSS. Sinus on tele.

## 2015-08-16 ENCOUNTER — Inpatient Hospital Stay (HOSPITAL_COMMUNITY): Payer: 59

## 2015-08-16 ENCOUNTER — Encounter (HOSPITAL_COMMUNITY): Payer: Self-pay | Admitting: Radiology

## 2015-08-16 DIAGNOSIS — G8929 Other chronic pain: Secondary | ICD-10-CM | POA: Diagnosis present

## 2015-08-16 DIAGNOSIS — E785 Hyperlipidemia, unspecified: Secondary | ICD-10-CM | POA: Diagnosis present

## 2015-08-16 DIAGNOSIS — Z823 Family history of stroke: Secondary | ICD-10-CM | POA: Diagnosis not present

## 2015-08-16 DIAGNOSIS — R0789 Other chest pain: Secondary | ICD-10-CM | POA: Diagnosis present

## 2015-08-16 DIAGNOSIS — Z833 Family history of diabetes mellitus: Secondary | ICD-10-CM | POA: Diagnosis not present

## 2015-08-16 DIAGNOSIS — Z800 Family history of malignant neoplasm of digestive organs: Secondary | ICD-10-CM | POA: Diagnosis not present

## 2015-08-16 DIAGNOSIS — Z9641 Presence of insulin pump (external) (internal): Secondary | ICD-10-CM | POA: Diagnosis present

## 2015-08-16 DIAGNOSIS — Z8249 Family history of ischemic heart disease and other diseases of the circulatory system: Secondary | ICD-10-CM | POA: Diagnosis not present

## 2015-08-16 DIAGNOSIS — E876 Hypokalemia: Secondary | ICD-10-CM | POA: Diagnosis present

## 2015-08-16 DIAGNOSIS — E0842 Diabetes mellitus due to underlying condition with diabetic polyneuropathy: Secondary | ICD-10-CM

## 2015-08-16 DIAGNOSIS — E1042 Type 1 diabetes mellitus with diabetic polyneuropathy: Secondary | ICD-10-CM | POA: Diagnosis present

## 2015-08-16 DIAGNOSIS — K219 Gastro-esophageal reflux disease without esophagitis: Secondary | ICD-10-CM | POA: Diagnosis present

## 2015-08-16 DIAGNOSIS — I10 Essential (primary) hypertension: Secondary | ICD-10-CM | POA: Diagnosis present

## 2015-08-16 DIAGNOSIS — E042 Nontoxic multinodular goiter: Secondary | ICD-10-CM | POA: Diagnosis present

## 2015-08-16 DIAGNOSIS — F17210 Nicotine dependence, cigarettes, uncomplicated: Secondary | ICD-10-CM | POA: Diagnosis present

## 2015-08-16 DIAGNOSIS — R079 Chest pain, unspecified: Secondary | ICD-10-CM

## 2015-08-16 DIAGNOSIS — E1065 Type 1 diabetes mellitus with hyperglycemia: Secondary | ICD-10-CM | POA: Diagnosis present

## 2015-08-16 DIAGNOSIS — M549 Dorsalgia, unspecified: Secondary | ICD-10-CM | POA: Diagnosis present

## 2015-08-16 DIAGNOSIS — E780 Pure hypercholesterolemia: Secondary | ICD-10-CM | POA: Diagnosis present

## 2015-08-16 DIAGNOSIS — F329 Major depressive disorder, single episode, unspecified: Secondary | ICD-10-CM | POA: Diagnosis present

## 2015-08-16 DIAGNOSIS — G2581 Restless legs syndrome: Secondary | ICD-10-CM | POA: Diagnosis present

## 2015-08-16 DIAGNOSIS — Z794 Long term (current) use of insulin: Secondary | ICD-10-CM | POA: Diagnosis not present

## 2015-08-16 LAB — CBC
HEMATOCRIT: 35.7 % — AB (ref 39.0–52.0)
HEMOGLOBIN: 12.9 g/dL — AB (ref 13.0–17.0)
MCH: 32.2 pg (ref 26.0–34.0)
MCHC: 36.1 g/dL — ABNORMAL HIGH (ref 30.0–36.0)
MCV: 89 fL (ref 78.0–100.0)
Platelets: 125 10*3/uL — ABNORMAL LOW (ref 150–400)
RBC: 4.01 MIL/uL — AB (ref 4.22–5.81)
RDW: 13.3 % (ref 11.5–15.5)
WBC: 5.8 10*3/uL (ref 4.0–10.5)

## 2015-08-16 LAB — GLUCOSE, CAPILLARY
GLUCOSE-CAPILLARY: 44 mg/dL — AB (ref 65–99)
GLUCOSE-CAPILLARY: 66 mg/dL (ref 65–99)
GLUCOSE-CAPILLARY: 84 mg/dL (ref 65–99)
Glucose-Capillary: 183 mg/dL — ABNORMAL HIGH (ref 65–99)
Glucose-Capillary: 129 mg/dL — ABNORMAL HIGH (ref 65–99)
Glucose-Capillary: 213 mg/dL — ABNORMAL HIGH (ref 65–99)
Glucose-Capillary: 180 mg/dL — ABNORMAL HIGH (ref 65–99)
Glucose-Capillary: 58 mg/dL — ABNORMAL LOW (ref 65–99)

## 2015-08-16 LAB — BASIC METABOLIC PANEL
ANION GAP: 7 (ref 5–15)
BUN: 5 mg/dL — ABNORMAL LOW (ref 6–20)
CO2: 26 mmol/L (ref 22–32)
Calcium: 8.2 mg/dL — ABNORMAL LOW (ref 8.9–10.3)
Chloride: 106 mmol/L (ref 101–111)
Creatinine, Ser: 1.01 mg/dL (ref 0.61–1.24)
GLUCOSE: 209 mg/dL — AB (ref 65–99)
POTASSIUM: 3.3 mmol/L — AB (ref 3.5–5.1)
SODIUM: 139 mmol/L (ref 135–145)

## 2015-08-16 LAB — PROTIME-INR
INR: 1.07 (ref 0.00–1.49)
Prothrombin Time: 14.1 seconds (ref 11.6–15.2)

## 2015-08-16 LAB — TROPONIN I: TROPONIN I: 0.08 ng/mL — AB (ref <0.031)

## 2015-08-16 LAB — APTT: APTT: 31 s (ref 24–37)

## 2015-08-16 MED ORDER — NITROGLYCERIN 0.4 MG SL SUBL: 0.4000 mg | SUBLINGUAL_TABLET | SUBLINGUAL | Status: DC | PRN

## 2015-08-16 MED ORDER — DEXTROSE 50 % IV SOLN
1.0000 | Freq: Once | INTRAVENOUS | Status: AC
  Administered 2015-08-16: 25 mL via INTRAVENOUS
  Filled 2015-08-16: qty 50

## 2015-08-16 MED ORDER — SODIUM CHLORIDE 0.9 % IV SOLN
INTRAVENOUS | Status: DC
  Administered 2015-08-16: 01:00:00 via INTRAVENOUS

## 2015-08-16 MED ORDER — IOHEXOL 350 MG/ML SOLN
100.0000 mL | Freq: Once | INTRAVENOUS | Status: AC | PRN
  Administered 2015-08-16: 100 mL via INTRAVENOUS

## 2015-08-16 MED ORDER — HYDROCODONE-ACETAMINOPHEN 10-325 MG PO TABS
1.0000 | ORAL_TABLET | Freq: Four times a day (QID) | ORAL | Status: DC | PRN
  Administered 2015-08-16 (×3): 1 via ORAL
  Filled 2015-08-16 (×3): qty 1

## 2015-08-16 MED ORDER — TESTOSTERONE 50 MG/5GM (1%) TD GEL
5.0000 g | Freq: Every day | TRANSDERMAL | Status: DC
  Administered 2015-08-16: 5 g via TRANSDERMAL
  Filled 2015-08-16: qty 5

## 2015-08-16 MED ORDER — INSULIN PUMP
Freq: Three times a day (TID) | SUBCUTANEOUS | Status: DC
  Administered 2015-08-16: 13.8 via SUBCUTANEOUS
  Filled 2015-08-16: qty 1

## 2015-08-16 MED ORDER — LIDOCAINE VISCOUS 2 % MT SOLN
5.0000 mL | OROMUCOSAL | Status: DC | PRN
  Filled 2015-08-16: qty 5

## 2015-08-16 MED ORDER — POTASSIUM CHLORIDE CRYS ER 20 MEQ PO TBCR
20.0000 meq | EXTENDED_RELEASE_TABLET | Freq: Once | ORAL | Status: AC
  Administered 2015-08-16: 20 meq via ORAL
  Filled 2015-08-16: qty 1

## 2015-08-16 MED ORDER — MORPHINE SULFATE (PF) 2 MG/ML IV SOLN
1.0000 mg | INTRAVENOUS | Status: DC | PRN
  Administered 2015-08-16: 1 mg via INTRAVENOUS
  Filled 2015-08-16: qty 1

## 2015-08-16 MED ORDER — GABAPENTIN 300 MG PO CAPS
300.0000 mg | ORAL_CAPSULE | Freq: Three times a day (TID) | ORAL | Status: DC
  Administered 2015-08-16 (×2): 300 mg via ORAL
  Filled 2015-08-16 (×2): qty 1

## 2015-08-16 MED ORDER — NICOTINE 14 MG/24HR TD PT24
14.0000 mg | MEDICATED_PATCH | Freq: Every day | TRANSDERMAL | Status: DC
  Filled 2015-08-16: qty 1

## 2015-08-16 MED ORDER — ASPIRIN 81 MG PO CHEW
324.0000 mg | CHEWABLE_TABLET | Freq: Once | ORAL | Status: AC
  Administered 2015-08-16: 324 mg via ORAL
  Filled 2015-08-16 (×2): qty 4

## 2015-08-16 MED ORDER — ACETAMINOPHEN 650 MG RE SUPP: 650.0000 mg | Freq: Four times a day (QID) | RECTAL | Status: DC | PRN

## 2015-08-16 MED ORDER — AMLODIPINE BESYLATE 10 MG PO TABS
10.0000 mg | ORAL_TABLET | Freq: Every day | ORAL | Status: DC
  Administered 2015-08-16: 10 mg via ORAL
  Filled 2015-08-16: qty 1

## 2015-08-16 MED ORDER — DIAZEPAM 5 MG PO TABS
5.0000 mg | ORAL_TABLET | Freq: Two times a day (BID) | ORAL | Status: DC | PRN
  Administered 2015-08-16: 5 mg via ORAL
  Filled 2015-08-16: qty 1

## 2015-08-16 MED ORDER — GNP ALCOHOL SWABS 70 % PADS: 1.0000 | MEDICATED_PAD | Status: DC

## 2015-08-16 MED ORDER — METOPROLOL SUCCINATE ER 25 MG PO TB24
12.5000 mg | ORAL_TABLET | Freq: Every day | ORAL | Status: DC
  Administered 2015-08-16: 12.5 mg via ORAL
  Filled 2015-08-16: qty 1

## 2015-08-16 MED ORDER — METOPROLOL TARTRATE 1 MG/ML IV SOLN
5.0000 mg | Freq: Once | INTRAVENOUS | Status: AC
  Administered 2015-08-16: 5 mg via INTRAVENOUS
  Filled 2015-08-16: qty 5

## 2015-08-16 MED ORDER — PANTOPRAZOLE SODIUM 40 MG PO TBEC
40.0000 mg | DELAYED_RELEASE_TABLET | Freq: Every day | ORAL | Status: DC
Start: 2015-08-16 — End: 2015-08-16
  Administered 2015-08-16: 40 mg via ORAL
  Filled 2015-08-16: qty 1

## 2015-08-16 MED ORDER — MORPHINE SULFATE (PF) 2 MG/ML IV SOLN
2.0000 mg | INTRAVENOUS | Status: DC | PRN
  Administered 2015-08-16 (×3): 2 mg via INTRAVENOUS
  Filled 2015-08-16 (×3): qty 1

## 2015-08-16 MED ORDER — GLUCOSE 40 % PO GEL
ORAL | Status: AC
  Administered 2015-08-16: 1
  Filled 2015-08-16: qty 1

## 2015-08-16 MED ORDER — SUMATRIPTAN SUCCINATE 100 MG PO TABS
100.0000 mg | ORAL_TABLET | Freq: Two times a day (BID) | ORAL | Status: DC | PRN
  Filled 2015-08-16: qty 1

## 2015-08-16 MED ORDER — SODIUM CHLORIDE 0.9 % IJ SOLN
3.0000 mL | Freq: Two times a day (BID) | INTRAMUSCULAR | Status: DC
Start: 2015-08-16 — End: 2015-08-16
  Administered 2015-08-16 (×2): 3 mL via INTRAVENOUS

## 2015-08-16 MED ORDER — HEPARIN SODIUM (PORCINE) 5000 UNIT/ML IJ SOLN
5000.0000 [IU] | Freq: Three times a day (TID) | INTRAMUSCULAR | Status: DC
  Administered 2015-08-16: 5000 [IU] via SUBCUTANEOUS
  Filled 2015-08-16: qty 1

## 2015-08-16 MED ORDER — ACETAMINOPHEN 325 MG PO TABS: 650.0000 mg | ORAL_TABLET | Freq: Four times a day (QID) | ORAL | Status: DC | PRN

## 2015-08-16 MED ORDER — ASPIRIN EC 81 MG PO TBEC: 81.0000 mg | DELAYED_RELEASE_TABLET | Freq: Every day | ORAL | Status: DC

## 2015-08-16 MED ORDER — TADALAFIL 5 MG PO TABS: 5.0000 mg | ORAL_TABLET | Freq: Every day | ORAL | Status: DC

## 2015-08-16 MED ORDER — ATORVASTATIN CALCIUM 80 MG PO TABS
80.0000 mg | ORAL_TABLET | Freq: Every day | ORAL | Status: DC
  Administered 2015-08-16: 80 mg via ORAL
  Filled 2015-08-16: qty 1

## 2015-08-16 MED ORDER — BENAZEPRIL HCL 40 MG PO TABS
40.0000 mg | ORAL_TABLET | Freq: Every day | ORAL | Status: DC
  Administered 2015-08-16: 40 mg via ORAL
  Filled 2015-08-16: qty 1

## 2015-08-16 NOTE — Procedures (Signed)
Hypoglycemic Event  CBG: 44  Treatment: 1 tube instant glucose  Symptoms: Sweaty  Follow-up CBG: Time:0714 CBG Result:66, 07:40 CBG 213  Possible Reasons for Event: Inadequate meal intake  Comments/MD notified:Md Aware [name], [name]. PA-C)    [name], [name]  Remember to initiate Hypoglycemia Order Set & complete

## 2015-08-16 NOTE — Evaluation (Signed)
Physical Therapy Evaluation Patient Details Name: [name] MRN: [id number] DOB: [birth year] Today's Date: 08/16/2015   History of Present Illness  HPI: [name] is a 44 y.o. male with PMH of type 1 diabetes on insulin pump, hyperlipidemia, attention, GERD, depression, panic disorder, tobacco abuse, drug abuse, RLS, migraine headaches, chronic back pain, low testosterone, presents with chest pain.  Clinical Impression   Patient evaluated by Physical Therapy with no further acute PT needs identified. All education has been completed and the patient has no further questions.  See below for any follow-up Physical Therapy or equipment needs. PT is signing off. Thank you for this referral.     Follow Up Recommendations No PT follow up    Equipment Recommendations  None recommended by PT    Recommendations for Other Services       Precautions / Restrictions Precautions Precautions: None      Mobility  Bed Mobility Overal bed mobility: Independent                Transfers Overall transfer level: Independent                  Ambulation/Gait Ambulation/Gait assistance: Supervision;Independent Ambulation Distance (Feet): 300 Feet Assistive device: None Gait Pattern/deviations: Step-through pattern;WFL(Within Functional Limits)     General Gait Details: Supervision progressing to independent  Stairs Stairs: Yes Stairs assistance: Modified independent (Device/Increase time) Stair Management: One rail Right;Alternating pattern;Forwards Number of Stairs: 12 General stair comments: no difficulty noted  Wheelchair Mobility    Modified Rankin (Stroke Patients Only)       Balance Overall balance assessment: No apparent balance deficits (not formally assessed)                                           Pertinent Vitals/Pain Pain Assessment: No/denies pain (recently had pain meds)    Home Living Family/patient expects to be discharged  to:: Private residence Living Arrangements: Spouse/significant other;Children Available Help at Discharge: Family;Available PRN/intermittently Type of Home: House Home Access: Stairs to enter Entrance Stairs-Rails: Right Entrance Stairs-Number of Steps: 6 Home Layout: Two level        Prior Function Level of Independence: Independent               Hand Dominance   Dominant Hand: Right    Extremity/Trunk Assessment   Upper Extremity Assessment: Overall WFL for tasks assessed           Lower Extremity Assessment: Overall WFL for tasks assessed      Cervical / Trunk Assessment: Normal  Communication   Communication: No difficulties  Cognition Arousal/Alertness: Awake/alert Behavior During Therapy: WFL for tasks assessed/performed Overall Cognitive Status: Within Functional Limits for tasks assessed                      General Comments General comments (skin integrity, edema, etc.): BP prior to amb 147/93; BP post amb 146/91    Exercises        Assessment/Plan    PT Assessment Patent does not need any further PT services  PT Diagnosis Acute pain   PT Problem List    PT Treatment Interventions     PT Goals (Current goals can be found in the Care Plan section) Acute Rehab PT Goals Patient Stated Goal: did not state PT Goal Formulation: All assessment and education complete, DC  therapy    Frequency     Barriers to discharge        Co-evaluation               End of Session   Activity Tolerance: Patient tolerated treatment well Patient left: in bed;with call bell/phone within reach;with family/visitor present Nurse Communication: Mobility status         Time: 4580-9983 PT Time Calculation (min) (ACUTE ONLY): 15 min   Charges:   PT Evaluation $Initial PT Evaluation Tier I: 1 Procedure     PT G CodesQuin Hoop 08/16/2015, 1:53 PM  [place] [place], [place]  Acute Rehabilitation Services Pager  [phone] Office [phone]

## 2015-08-16 NOTE — Discharge Summary (Signed)
Physician Discharge Summary  [name] [id number] DOB: [birth year] DOA: 08/15/2015  PCP: Penni Homans, MD  Admit date: 08/15/2015 Discharge date: 08/16/2015  Time spent: 45 minutes  Recommendations for Outpatient Follow-up:  1. Multinodular thyroid gland needs further workup and monitoring  Discharge Diagnoses:  Principal Problem:   Chest pain Active Problems:   Hyperlipidemia   RLS (restless legs syndrome)   Back pain   Depression   Diabetic polyneuropathy   Essential hypertension, benign   Type 1 diabetes mellitus with neurological manifestations, uncontrolled   Low testosterone   Tobacco abuse counseling   Discharge Condition: stable  Diet recommendation: Diabetic  Filed Weights   08/15/15 1638 08/15/15 2330  Weight: 92.987 kg (205 lb) 91.989 kg (202 lb 12.8 oz)    History of present illness:  Chief Complaint: chest pain HPI: [name] is a 44 y.o. male with PMH of type 1 diabetes on insulin pump, hyperlipidemia, attention, GERD, depression, panic disorder, tobacco abuse, drug abuse, RLS, migraine headaches, chronic back pain, low testosterone, presented with chest pain. Patient reported that his chest pain started 9/23 am, located in the left chest, constant, 7 out of 10 in severity, radiating to the left arm, jaw and shoulder  Hospital Course:   Atypical chest pain -ruled out for ACS, EKG and troponins WNL, had been requesting IV narcotics frequently -Had a normal Myoview 3 months ago -also had CTA chest which was negative for dissection or PE -advised to FU with PCP  Multinodular thyroid -noted on CTA chest, followed by Dr.Kumar, will defer to him  DM -on Insulin pump, FU with Dr.Kumar  Hypokalemia -replaced  Discharge Exam: Filed Vitals:   08/16/15 0500  BP: 119/86  Pulse: 67  Temp: 97.9 F (36.6 C)  Resp: 17    General: AAOx3 Cardiovascular:S1S2/RRR Respiratory: CTAB  Discharge Instructions   Discharge Instructions    Diet - low  sodium heart healthy    Complete by:  As directed      Diet Carb Modified    Complete by:  As directed      Discharge instructions    Complete by:  As directed   You have  A multinodular thyroid gland, that needs further workup and monitoring     Increase activity slowly    Complete by:  As directed           Current Discharge Medication List    CONTINUE these medications which have NOT CHANGED   Details  amLODipine (NORVASC) 10 MG tablet Take 1 tablet (10 mg total) by mouth daily. Qty: 90 tablet, Refills: 2    aspirin-acetaminophen-caffeine (EXCEDRIN MIGRAINE) 448-185-63 MG per tablet Take 2 tablets by mouth every 6 (six) hours as needed for headache.    atorvastatin (LIPITOR) 80 MG tablet TAKE 1 TABLET BY MOUTH DAILY. Qty: 90 tablet, Refills: 1    BAYER MICROLET LANCETS lancets USE AS DIRECTED TO CHECK BLOOD SUGAR Qty: 200 each, Refills: 2    benazepril (LOTENSIN) 40 MG tablet Take 1 tablet (40 mg total) by mouth daily. Qty: 90 tablet, Refills: 2    Blood Glucose Monitoring Suppl (ONE TOUCH ULTRA SYSTEM KIT) W/DEVICE KIT 1 kit by Does not apply route once. Qty: 1 each, Refills: 0    diazepam (VALIUM) 5 MG tablet TAKE 1 TABLET BY MOUTH TWICE DAILY AS NEEDED FOR ANXIETY OR MUSCLE SPASMS Qty: 60 tablet, Refills: 1    DULoxetine (CYMBALTA) 60 MG capsule TAKE 1 CAPSULE (60 MG TOTAL) BY MOUTH DAILY.  Qty: 90 capsule, Refills: 1    gabapentin (NEURONTIN) 300 MG capsule TAKE 3 CAPSULES BY MOUTH TAKE 3 TIMES A DAY Qty: 270 capsule, Refills: 0    glucose blood (BAYER CONTOUR NEXT TEST) test strip Check blood sugar 4-8 times daily Qty: 300 each, Refills: 2    GNP ALCOHOL SWABS 70 % PADS Apply 1 each topically as needed. Qty: 700 each, Refills: 11    HYDROcodone-acetaminophen (NORCO) 10-325 MG per tablet Take 1 tablet by mouth every 6 (six) hours as needed. Qty: 120 tablet, Refills: 0    insulin aspart (NOVOLOG) 100 UNIT/ML injection Use max 140 units daily in insulin pump as  advised. Qty: 50 mL, Refills: 3    Lancet Devices (BAYER MICROLET 2 LANCING DEVIC) MISC USE AS DIRECTED TO CHECK BLOOD SUGAR Qty: 1 each, Refills: 0    lidocaine (XYLOCAINE) 2 % solution Use as directed 5 mLs in the mouth or throat every 4 (four) hours as needed for mouth pain. Apply to affected area with swabs prn Qty: 50 mL, Refills: 0    metoprolol succinate (TOPROL XL) 25 MG 24 hr tablet Take 0.5 tablets (12.5 mg total) by mouth daily. Qty: 45 tablet, Refills: 1    nicotine (NICODERM CQ - DOSED IN MG/24 HOURS) 14 mg/24hr patch Place 1 patch (14 mg total) onto the skin daily. Qty: 28 patch, Refills: 1    nicotine (NICODERM CQ - DOSED IN MG/24 HR) 7 mg/24hr patch Place 1 patch (7 mg total) onto the skin daily. Qty: 28 patch, Refills: 1    pantoprazole (PROTONIX) 40 MG tablet Take 1 tablet (40 mg total) by mouth daily. Qty: 90 tablet, Refills: 0    SUMAtriptan (IMITREX) 100 MG tablet TAKE 1 TABLET (100 MG TOTAL) BY MOUTH EVERY 2 (TWO) HOURS AS NEEDED. FOR HEADACHE Qty: 9 tablet, Refills: 3    tadalafil (CIALIS) 5 MG tablet Take 1 tablet (5 mg total) by mouth daily. Qty: 30 tablet, Refills: 5    Testosterone (ANDROGEL PUMP) 20.25 MG/ACT (1.62%) GEL Apply 4 pumps daily to skin. Qty: 75 g, Refills: 3      STOP taking these medications     traMADol (ULTRAM) 50 MG tablet        No Known Allergies Follow-up Information    Follow up with Penni Homans, MD. Schedule an appointment as soon as possible for a visit in 1 week.   Specialty:  Family Medicine   Contact information:   [place] [address] [phone]        The results of significant diagnostics from this hospitalization (including imaging, microbiology, ancillary and laboratory) are listed below for reference.    Significant Diagnostic Studies: Dg Chest 2 View  08/15/2015   CLINICAL DATA:  Chest pain for approximately 1 hour  EXAM: CHEST - 2 VIEW  COMPARISON:  04/30/2015   FINDINGS: The heart size and mediastinal contours are within normal limits. Both lungs are clear. The visualized skeletal structures are unremarkable.  IMPRESSION: No active disease.   Electronically Signed   By: Inez Catalina M.D.   On: 08/15/2015 17:33   Ct Angio Chest Aorta W/cm &/or Wo/cm  08/16/2015   CLINICAL DATA:  Left chest pain since yesterday morning. Pain radiates into the left arm, jaw and shoulder. Clinical concern for aortic dissection.  EXAM: CT ANGIOGRAPHY CHEST, ABDOMEN AND PELVIS  TECHNIQUE: Multidetector CT imaging through the chest, abdomen and pelvis was performed using the standard protocol during  bolus administration of intravenous contrast. Multiplanar reconstructed images and MIPs were obtained and reviewed to evaluate the vascular anatomy.  CONTRAST:  145mL OMNIPAQUE IOHEXOL 350 MG/ML SOLN  COMPARISON:  Chest radiographs obtained earlier today.  FINDINGS: CTA CHEST FINDINGS  Proximal coronary artery calcifications. Normal appearing thoracic aorta without aneurysm or dissection. The pulmonary arteries are normally opacified with no pulmonary arterial filling defects seen. Mild diffuse peribronchial thickening. Minimal dependent atelectasis bilaterally. Small amount of airspace opacity in the superior aspect of the right lower lobe. No lung nodules or enlarged lymph nodes. No pleural fluid. 7 mm right lobe thyroid nodule. Additional tiny nodules in the thyroid gland bilaterally. Thoracic spine degenerative changes.  Review of the MIP images confirms the above findings.  CTA ABDOMEN AND PELVIS FINDINGS  Bilateral iliac artery atheromatous calcifications. Minimal aortic calcification. The proximal celiac axis his tortuous with mild focal narrowing. Otherwise, the arteries are normal in caliber. No aneurysm or dissection. Normal appearing liver, spleen, gallbladder, adrenal glands, kidneys, ureters, urinary bladder and prostate gland. The pancreas is small. No gastrointestinal abnormalities  or enlarged lymph nodes. Normal appearing appendix. Lumbar spine degenerative changes.  Review of the MIP images confirms the above findings.  IMPRESSION: 1. No aortic aneurysm or dissection. 2. No pulmonary emboli seen. 3. Diffuse pancreatic atrophy. 4. Normal sized, multinodular thyroid gland, none most likely benign in the absence of known clinical risk factors for thyroid carcinoma. 5. Small amount of probable patchy atelectasis in the superior aspect of the right lower lobe. Pneumonia is less likely. 6. Mild bronchitic changes.   Electronically Signed   By: Claudie Revering M.D.   On: 08/16/2015 03:18   Ct Angio Abd/pel W/ And/or W/o  08/16/2015   CLINICAL DATA:  Left chest pain since yesterday morning. Pain radiates into the left arm, jaw and shoulder. Clinical concern for aortic dissection.  EXAM: CT ANGIOGRAPHY CHEST, ABDOMEN AND PELVIS  TECHNIQUE: Multidetector CT imaging through the chest, abdomen and pelvis was performed using the standard protocol during bolus administration of intravenous contrast. Multiplanar reconstructed images and MIPs were obtained and reviewed to evaluate the vascular anatomy.  CONTRAST:  157mL OMNIPAQUE IOHEXOL 350 MG/ML SOLN  COMPARISON:  Chest radiographs obtained earlier today.  FINDINGS: CTA CHEST FINDINGS  Proximal coronary artery calcifications. Normal appearing thoracic aorta without aneurysm or dissection. The pulmonary arteries are normally opacified with no pulmonary arterial filling defects seen. Mild diffuse peribronchial thickening. Minimal dependent atelectasis bilaterally. Small amount of airspace opacity in the superior aspect of the right lower lobe. No lung nodules or enlarged lymph nodes. No pleural fluid. 7 mm right lobe thyroid nodule. Additional tiny nodules in the thyroid gland bilaterally. Thoracic spine degenerative changes.  Review of the MIP images confirms the above findings.  CTA ABDOMEN AND PELVIS FINDINGS  Bilateral iliac artery atheromatous  calcifications. Minimal aortic calcification. The proximal celiac axis his tortuous with mild focal narrowing. Otherwise, the arteries are normal in caliber. No aneurysm or dissection. Normal appearing liver, spleen, gallbladder, adrenal glands, kidneys, ureters, urinary bladder and prostate gland. The pancreas is small. No gastrointestinal abnormalities or enlarged lymph nodes. Normal appearing appendix. Lumbar spine degenerative changes.  Review of the MIP images confirms the above findings.  IMPRESSION: 1. No aortic aneurysm or dissection. 2. No pulmonary emboli seen. 3. Diffuse pancreatic atrophy. 4. Normal sized, multinodular thyroid gland, none most likely benign in the absence of known clinical risk factors for thyroid carcinoma. 5. Small amount of probable patchy atelectasis in the superior aspect  of the right lower lobe. Pneumonia is less likely. 6. Mild bronchitic changes.   Electronically Signed   By: Claudie Revering M.D.   On: 08/16/2015 03:18    Microbiology: No results found for this or any previous visit (from the past 240 hour(s)).   Labs: Basic Metabolic Panel:  Recent Labs Lab 08/12/15 1425 08/15/15 1655 08/16/15 0740  NA 140 139 139  K 3.9 3.3* 3.3*  CL 107 104 106  CO2 _0 GLUCOSE 238* 208* 209*  BUN 12 8 5*  CREATININE 0.89 0.86 1.01  CALCIUM 9.2 9.2 8.2*   Liver Function Tests:  Recent Labs Lab 08/15/15 1655  AST 19  ALT 16*  ALKPHOS 68  BILITOT 0.9  PROT 6.9  ALBUMIN 4.2   No results for input(s): LIPASE, AMYLASE in the last 168 hours. No results for input(s): AMMONIA in the last 168 hours. CBC:  Recent Labs Lab 08/15/15 1655 08/16/15 0740  WBC 6.6 5.8  HGB 13.7 12.9*  HCT 38.8* 35.7*  MCV 88.4 89.0  PLT 146* 125*   Cardiac Enzymes:  Recent Labs Lab 08/15/15 1655 08/16/15 0110 08/16/15 0740  TROPONINI <0.03 <0.03 <0.03   BNP: BNP (last 3 results) No results for input(s): BNP in the last 8760 hours.  ProBNP (last 3 results) No  results for input(s): PROBNP in the last 8760 hours.  CBG:  Recent Labs Lab 08/15/15 2326 08/16/15 0400 08/16/15 0649 08/16/15 0714 08/16/15 0740  GLUCAP 183* 129* 44* 66 213*       Signed:  JOSEPH,PREETHA  [facility] 08/16/2015, 10:13 AM

## 2015-08-16 NOTE — Progress Notes (Signed)
Inpatient Diabetes Program Recommendations  AACE/ADA: New Consensus Statement on Inpatient Glycemic Control (2015)  Target Ranges:  Prepandial:   less than 140 mg/dL      Peak postprandial:   less than 180 mg/dL (1-2 hours)      Critically ill patients:  140 - 180 mg/dL   Review of Glycemic Control  Diabetes history:Type 1 diabetes Outpatient Diabetes medications: insulin pump Current orders for Inpatient glycemic control: insulin pump  Inpatient Diabetes Program Recommendations:  Received consult. Patient admitted with chest pain. Was diagnosed with diabetes for about 14 years. Is seen by  [facility] as outpatient. Has insulin pump. Was seen by Dr. Dwyane Dee, endocrinologist, on 9/21 regarding diabetes care and insulin pump.  Spoke with staff RN on phone regarding patient contract, insulin pump supplies with patient, and to make sure patient is eating.   Basal settings on pump:(per Dr. Ronnie Derby note in chart review) MN 1.4 U /hr. 7 am 2.0 U/hr. 1 pm 1.7 U/hr. 10 pm 1.5 U/hr.    Total: 81 units +/- 14 with 40% in boluses CHO coverage: 1:4 Correction factor: 1:15 Target glucose: 110-140 mg/dl  Will continue to monitor blood sugars while in hospital. Will need to follow up with PCP and Dr. Dwyane Dee at discharge. Harvel Ricks RN BSN CDE

## 2015-08-17 LAB — GLUCOSE, CAPILLARY: GLUCOSE-CAPILLARY: 81 mg/dL (ref 65–99)

## 2015-08-18 ENCOUNTER — Telehealth: Payer: Self-pay

## 2015-08-18 NOTE — Telephone Encounter (Signed)
PCP: Penni Homans, MD  Admit date: 08/15/2015 Discharge date: 08/16/2015  Reason for admission: Chest pain  Called and left a message for call back.

## 2015-08-19 NOTE — Progress Notes (Signed)
ATTENDING PHYSICIAN NOTE:Link to wellness Program I have reviewed the chart and agree with the plan as detailed above. Sara Neal MD Pager [phone]  

## 2015-08-20 ENCOUNTER — Telehealth: Payer: Self-pay | Admitting: Family Medicine
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
## 2015-08-20 NOTE — Telephone Encounter (Signed)
Patient calling checking on the status of medication below.

## 2015-08-20 NOTE — Telephone Encounter (Signed)
Please cancel refill at his pharmacy and apologize to him but medically I cannot write rx til due, please call the pharmacy and cancel his refill so it cannot be filled, we can give him a new refill later

## 2015-08-21 MED ORDER — LORAZEPAM 0.5 MG PO TABS: 0.5000 mg | ORAL_TABLET | Freq: Every day | ORAL | Status: DC | PRN

## 2015-08-21 MED ORDER — TRAMADOL HCL 50 MG PO TABS: 50.0000 mg | ORAL_TABLET | Freq: Two times a day (BID) | ORAL | Status: DC

## 2015-08-21 NOTE — Telephone Encounter (Signed)
Printed as PCP instructed both prescriptions. Faxed to [place] and patient informed

## 2015-08-21 NOTE — Telephone Encounter (Signed)
I called [employer] and he stated he is aware of situtation.  The patients last refill was 08/14/15 and did cancel all refills on this prescription.  Informed that PCP would give this patient a new prescription for Valium when due next month.  Will contact the patient.
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
## 2015-08-21 NOTE — Telephone Encounter (Signed)
He can have Tramadol 50 mg tabs 1 tab po bid prn severe pain #30, Lorazepam 0.5 mg 1 tab po daily prn anxiety,disp #30 for emergencies this one time

## 2015-09-08 ENCOUNTER — Other Ambulatory Visit: Payer: Self-pay | Admitting: Family Medicine

## 2015-09-10 ENCOUNTER — Ambulatory Visit (INDEPENDENT_AMBULATORY_CARE_PROVIDER_SITE_OTHER): Payer: Self-pay | Admitting: Family Medicine

## 2015-09-10 ENCOUNTER — Encounter: Payer: Self-pay | Admitting: Pharmacist

## 2015-09-10 ENCOUNTER — Other Ambulatory Visit: Payer: Self-pay | Admitting: Family Medicine

## 2015-09-10 VITALS — Ht 70.0 in | Wt 208.2 lb

## 2015-09-10 DIAGNOSIS — E108 Type 1 diabetes mellitus with unspecified complications: Secondary | ICD-10-CM

## 2015-09-10 MED ORDER — GABAPENTIN 300 MG PO CAPS: ORAL_CAPSULE | ORAL | Status: DC

## 2015-09-10 NOTE — Progress Notes (Signed)
Subjective:  Patient presents today for 3 month diabetes follow-up as part of the employer-sponsored Link to Wellness program.  Current diabetes regimen includes Novolog.  Patient also continues on daily ACE Inhibitor and statin.  Most recent MD follow-up was 9/21 to endo Dwyane Dee). Patient has pending appointment for 12/6 and 12/21 with PCP and endo, respectively. No med changes or major health changes at this time.  Assessment:  Diabetes: Most recent A1C was 7.6% which is exceeding goal of less than 7%. Weight is decreased 5.4 lbs from last visit with me.   Lifestyle improvements:  Physical Activity-  Mr. Clevenger is walking a bit more frequently, but the coming weather change to winter may impact this.   Nutrition- Still somewhat limited choices due to mouth pain, it seems he has reduced portion sizes and/or frequency of eating. Reports eating earlier in the day than previous (soime food around 8 or 9 AM which he didn't previously do).   Follow up with me in 3 months.    Plan/Goals for Next Visit:   1.  Follow up with Sensor company about transmitter. Find the solution to the problem with the transmitter. 2. Watch mid-day blood glucoses (~2pm to 5pm). If they remain elevated, contact Dr. Dwyane Dee to consider increasing basal rate during ~12p-4p.   Next appointment to see me is: December 14th, @ 3PM.

## 2015-09-11 ENCOUNTER — Other Ambulatory Visit: Payer: Self-pay | Admitting: Family Medicine

## 2015-09-11 NOTE — Telephone Encounter (Signed)
Called the patient informed of PA's denial stating had been filled on 08/21/15 which would make this request today to be too early. The patient informed/reminded me his prescriptions were stollen and PCP replace them on 08/21/15. States he had been prescribed lorazepam to take one daily and has now run out.  I did inform him I would need to forward this request to PCP and would not be in the office until Monday 09/14/15 to address this request.  He verbally was ok with that.
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
## 2015-09-11 NOTE — Telephone Encounter (Signed)
Patient informed of PCP instructions regarding refill for next week.   Patient stated will contact our office then next week.  Stated no problem

## 2015-09-11 NOTE — Telephone Encounter (Signed)
Requesting:  Valium Contract  Signed on 12/10/14 UDS  Overdue, last on was Moderate Last OV   07/28/15 Last Refill   #60 with 1 refill on 08/13/15  Please Advise

## 2015-09-17 ENCOUNTER — Other Ambulatory Visit: Payer: Self-pay | Admitting: Family Medicine

## 2015-09-17 NOTE — Telephone Encounter (Signed)
Requesting: Tramadol and Diazepam Contract signed on 12/10/14 UDS Moderate, overdue now Last OV  07/28/15 Last Refill  Tramadol  #60 with 0 refills on 08/21/15                    Diazepam  #60 with 0 refills 08/13/15 Please Advise

## 2015-09-17 NOTE — Telephone Encounter (Signed)
OK to refill meds, check UDS

## 2015-09-18 NOTE — Telephone Encounter (Signed)
Printed both the tramadol and valium.  PCP signed and I attached a note to scripts to do UDS when picking these prescriptons up.  Called the patient left detailed message refills done and to pickup at our office and needs UDS.

## 2015-10-04 ENCOUNTER — Encounter (HOSPITAL_BASED_OUTPATIENT_CLINIC_OR_DEPARTMENT_OTHER): Payer: Self-pay | Admitting: Emergency Medicine

## 2015-10-04 ENCOUNTER — Emergency Department (HOSPITAL_BASED_OUTPATIENT_CLINIC_OR_DEPARTMENT_OTHER)
Admission: EM | Admit: 2015-10-04 | Discharge: 2015-10-04 | Disposition: A | Discharge status: Home / Self Care | Payer: 59 | Attending: Emergency Medicine | Admitting: Emergency Medicine

## 2015-10-04 ENCOUNTER — Emergency Department (HOSPITAL_BASED_OUTPATIENT_CLINIC_OR_DEPARTMENT_OTHER): Payer: 59

## 2015-10-04 DIAGNOSIS — Z8701 Personal history of pneumonia (recurrent): Secondary | ICD-10-CM | POA: Insufficient documentation

## 2015-10-04 DIAGNOSIS — E1165 Type 2 diabetes mellitus with hyperglycemia: Secondary | ICD-10-CM | POA: Insufficient documentation

## 2015-10-04 DIAGNOSIS — S0990XA Unspecified injury of head, initial encounter: Secondary | ICD-10-CM | POA: Insufficient documentation

## 2015-10-04 DIAGNOSIS — I10 Essential (primary) hypertension: Secondary | ICD-10-CM | POA: Insufficient documentation

## 2015-10-04 DIAGNOSIS — F17210 Nicotine dependence, cigarettes, uncomplicated: Secondary | ICD-10-CM | POA: Insufficient documentation

## 2015-10-04 DIAGNOSIS — W108XXA Fall (on) (from) other stairs and steps, initial encounter: Secondary | ICD-10-CM | POA: Diagnosis not present

## 2015-10-04 DIAGNOSIS — H269 Unspecified cataract: Secondary | ICD-10-CM | POA: Diagnosis not present

## 2015-10-04 DIAGNOSIS — Y9389 Activity, other specified: Secondary | ICD-10-CM | POA: Insufficient documentation

## 2015-10-04 DIAGNOSIS — G2581 Restless legs syndrome: Secondary | ICD-10-CM | POA: Diagnosis not present

## 2015-10-04 DIAGNOSIS — Z79899 Other long term (current) drug therapy: Secondary | ICD-10-CM | POA: Insufficient documentation

## 2015-10-04 DIAGNOSIS — E785 Hyperlipidemia, unspecified: Secondary | ICD-10-CM | POA: Diagnosis not present

## 2015-10-04 DIAGNOSIS — Y9289 Other specified places as the place of occurrence of the external cause: Secondary | ICD-10-CM | POA: Diagnosis not present

## 2015-10-04 DIAGNOSIS — F410 Panic disorder [episodic paroxysmal anxiety] without agoraphobia: Secondary | ICD-10-CM | POA: Insufficient documentation

## 2015-10-04 DIAGNOSIS — G43909 Migraine, unspecified, not intractable, without status migrainosus: Secondary | ICD-10-CM | POA: Insufficient documentation

## 2015-10-04 DIAGNOSIS — Y998 Other external cause status: Secondary | ICD-10-CM | POA: Insufficient documentation

## 2015-10-04 DIAGNOSIS — F329 Major depressive disorder, single episode, unspecified: Secondary | ICD-10-CM | POA: Insufficient documentation

## 2015-10-04 DIAGNOSIS — K219 Gastro-esophageal reflux disease without esophagitis: Secondary | ICD-10-CM | POA: Insufficient documentation

## 2015-10-04 DIAGNOSIS — S93402A Sprain of unspecified ligament of left ankle, initial encounter: Secondary | ICD-10-CM | POA: Diagnosis not present

## 2015-10-04 DIAGNOSIS — Z794 Long term (current) use of insulin: Secondary | ICD-10-CM | POA: Diagnosis not present

## 2015-10-04 DIAGNOSIS — R739 Hyperglycemia, unspecified: Secondary | ICD-10-CM

## 2015-10-04 DIAGNOSIS — S79912A Unspecified injury of left hip, initial encounter: Secondary | ICD-10-CM | POA: Diagnosis not present

## 2015-10-04 DIAGNOSIS — W19XXXA Unspecified fall, initial encounter: Secondary | ICD-10-CM

## 2015-10-04 DIAGNOSIS — S99912A Unspecified injury of left ankle, initial encounter: Secondary | ICD-10-CM | POA: Diagnosis present

## 2015-10-04 LAB — CBG MONITORING, ED: GLUCOSE-CAPILLARY: 332 mg/dL — AB (ref 65–99)

## 2015-10-04 MED ORDER — METHOCARBAMOL 500 MG PO TABS
500.0000 mg | ORAL_TABLET | Freq: Once | ORAL | Status: AC
  Administered 2015-10-04: 500 mg via ORAL
  Filled 2015-10-04: qty 1

## 2015-10-04 MED ORDER — METHOCARBAMOL 500 MG PO TABS
ORAL_TABLET | ORAL | Status: AC
  Filled 2015-10-04: qty 1

## 2015-10-04 MED ORDER — METHOCARBAMOL 500 MG PO TABS: 500.0000 mg | ORAL_TABLET | Freq: Two times a day (BID) | ORAL | Status: DC | PRN

## 2015-10-04 NOTE — ED Notes (Signed)
Pt refused crutches stating he has some at home he will use.
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
## 2015-10-04 NOTE — ED Notes (Signed)
Pt in c/o fall down 5-6 steps. Endorses R ankle, leg, and hip pain. Wife also states he hit his head on concrete. Neuro intact, alert and oriented, ambulatory with limp.

## 2015-10-04 NOTE — Discharge Instructions (Signed)
For pain control you may take up to 800mg  of Motrin (also known as ibuprofen). That is usually 4 over the counter pills,  3 times a day. Take with food to minimize stomach irritation   You can also take  tylenol (acetaminophen) 975mg  (this is 3 over the counter pills) four times a day. Do not drink alcohol or combine with other medications that have acetaminophen as an ingredient (Read the labels!).    For breakthrough pain you may take Robaxin. Do not drink alcohol, drive or operate heavy machinery when taking Robaxin.   Ankle Sprain An ankle sprain is an injury to the strong, fibrous tissues (ligaments) that hold the bones of your ankle joint together.  CAUSES An ankle sprain is usually caused by a fall or by twisting your ankle. Ankle sprains most commonly occur when you step on the outer edge of your foot, and your ankle turns inward. People who participate in sports are more prone to these types of injuries.  SYMPTOMS   Pain in your ankle. The pain may be present at rest or only when you are trying to stand or walk.  Swelling.  Bruising. Bruising may develop immediately or within 1 to 2 days after your injury.  Difficulty standing or walking, particularly when turning corners or changing directions. DIAGNOSIS  Your caregiver will ask you details about your injury and perform a physical exam of your ankle to determine if you have an ankle sprain. During the physical exam, your caregiver will press on and apply pressure to specific areas of your foot and ankle. Your caregiver will try to move your ankle in certain ways. An X-ray exam may be done to be sure a bone was not broken or a ligament did not separate from one of the bones in your ankle (avulsion fracture).  TREATMENT  Certain types of braces can help stabilize your ankle. Your caregiver can make a recommendation for this. Your caregiver may recommend the use of medicine for pain. If your sprain is severe, your caregiver may refer  you to a surgeon who helps to restore function to parts of your skeletal system (orthopedist) or a physical therapist. [place] ice to your injury for 1-2 days or as directed by your caregiver. Applying ice helps to reduce inflammation and pain.  Put ice in a plastic bag.  Place a towel between your skin and the bag.  Leave the ice on for 15-20 minutes at a time, every 2 hours while you are awake.  Only take over-the-counter or prescription medicines for pain, discomfort, or fever as directed by your caregiver.  Elevate your injured ankle above the level of your heart as much as possible for 2-3 days.  If your caregiver recommends crutches, use them as instructed. Gradually put weight on the affected ankle. Continue to use crutches or a cane until you can walk without feeling pain in your ankle.  If you have a plaster splint, wear the splint as directed by your caregiver. Do not rest it on anything harder than a pillow for the first 24 hours. Do not put weight on it. Do not get it wet. You may take it off to take a shower or bath.  You may have been given an elastic bandage to wear around your ankle to provide support. If the elastic bandage is too tight (you have numbness or tingling in your foot or your foot becomes cold and blue), adjust the bandage to make it  comfortable.  If you have an air splint, you may blow more air into it or let air out to make it more comfortable. You may take your splint off at night and before taking a shower or bath. Wiggle your toes in the splint several times per day to decrease swelling. SEEK MEDICAL CARE IF:   You have rapidly increasing bruising or swelling.  Your toes feel extremely cold or you lose feeling in your foot.  Your pain is not relieved with medicine. SEEK IMMEDIATE MEDICAL CARE IF:  Your toes are numb or blue.  You have severe pain that is increasing. MAKE SURE YOU:   Understand these instructions.  Will  watch your condition.  Will get help right away if you are not doing well or get worse.   This information is not intended to replace advice given to you by your health care provider. Make sure you discuss any questions you have with your health care provider.   Document Released: 11/07/2005 Document Revised: 11/28/2014 Document Reviewed: 11/19/2011 Elsevier Interactive Patient Education [employer].

## 2015-10-04 NOTE — ED Notes (Signed)
Patient transported to X-ray 

## 2015-10-04 NOTE — ED Notes (Signed)
Blood Sugar 332, pt self administered 6.9 units of insulin via his insulin pump.

## 2015-10-04 NOTE — ED Notes (Signed)
Last blood sugar was 289 at 1003 am.

## 2015-10-12 ENCOUNTER — Other Ambulatory Visit: Payer: Self-pay | Admitting: Family Medicine

## 2015-10-19 ENCOUNTER — Other Ambulatory Visit: Payer: Self-pay | Admitting: Family Medicine

## 2015-10-19 NOTE — Telephone Encounter (Signed)
Requesting:  Tramadol Contract  12/10/14 UDS  Moderate Last OV   07/28/15 Last Refill   #60 with 0 refills on 10.28/2016  Please Advise

## 2015-10-19 NOTE — Telephone Encounter (Signed)
Faxed hardcopy for Tramadol to [facility]. 

## 2015-10-27 ENCOUNTER — Encounter: Payer: 59 | Admitting: Family Medicine

## 2015-11-09 ENCOUNTER — Other Ambulatory Visit: Payer: Self-pay | Admitting: Family Medicine

## 2015-11-09 ENCOUNTER — Other Ambulatory Visit: Payer: Self-pay | Admitting: Endocrinology

## 2015-11-11 ENCOUNTER — Ambulatory Visit: Payer: Self-pay | Admitting: Endocrinology

## 2015-11-16 ENCOUNTER — Other Ambulatory Visit: Payer: Self-pay | Admitting: Family Medicine

## 2015-11-17 ENCOUNTER — Other Ambulatory Visit: Payer: Self-pay | Admitting: Physician Assistant

## 2015-11-17 ENCOUNTER — Other Ambulatory Visit: Payer: Self-pay | Admitting: Family Medicine

## 2015-11-17 MED ORDER — DIAZEPAM 5 MG PO TABS: ORAL_TABLET | ORAL | Status: DC

## 2015-11-17 MED ORDER — TRAMADOL HCL 50 MG PO TABS: ORAL_TABLET | ORAL | Status: DC

## 2015-11-17 NOTE — Telephone Encounter (Signed)
Called the patient informed hardcopy's of both tramadol and valium are ready for pickup, but must do a UDS before getting hardcopy's.  The patient verbally agreed to do so.

## 2015-11-17 NOTE — Telephone Encounter (Signed)
Printed valium hardcopy.  Patient informed to pickup both Tramadol and Valium hardcopy, would need to do UDS before receiving refills.

## 2015-11-17 NOTE — Telephone Encounter (Signed)
Printed hardcopy per PCP instructions.

## 2015-11-17 NOTE — Telephone Encounter (Signed)
Requesting:  Valium Contract  12/10/2014 UDS  Moderate due Last OV  07/28/2015 Last Refill   #60 with 1 refill on 09/18/2015  Please Advise

## 2015-11-20 ENCOUNTER — Encounter: Payer: Self-pay | Admitting: Pharmacist

## 2015-11-20 ENCOUNTER — Ambulatory Visit (INDEPENDENT_AMBULATORY_CARE_PROVIDER_SITE_OTHER): Payer: Self-pay | Admitting: Family Medicine

## 2015-11-20 VITALS — BP 140/94 | HR 80 | Ht 70.0 in | Wt 204.6 lb

## 2015-11-20 DIAGNOSIS — E108 Type 1 diabetes mellitus with unspecified complications: Secondary | ICD-10-CM

## 2015-11-20 NOTE — Progress Notes (Signed)
Subjective:  Patient presents today for 1 month diabetes follow-up as part of the employer-sponsored [employer] to Well[employer] program.  Current diabetes regimen includes Novolog via pump.  Patient has discontinued ASA including Excedrin x 1 wk, continues on daily ACE Inhibitor and statin.  Most recent MD follow-up was 08/12/15.  Patient has a pending appt for 12/22/15. No significant medication or health changes at this time.    Assessment:  Diabetes: Most recent A1C was 7.6% which is exceeding goal of less than 7%. Weight is decreased 3.6 lbs from last visit with me.   Lifestyle improvements:  Physical Activity- Physical activity has decreased in recent past few weeks due to increased need to care for wife. Will restart now that she has returned to work. To restart typical walking of 30-40 minutes 3-5 times weekly.  Nutrition- Diet has also decreased in quality in last few weeks. Still has some weight loss (almost 4lbs) since last visit. Will be able to focus more on food now that wife has returned to work and is feeling better.  Follow up with me in 1 month.    Plan/Goals for Next Visit:  1. Start sensor. Be persistent and make it happen. 2. Start accurately carbohydrate counting.    Next appointment to see me is: 12/31/15 @ 3PM

## 2015-11-26 DIAGNOSIS — E109 Type 1 diabetes mellitus without complications: Secondary | ICD-10-CM | POA: Diagnosis not present

## 2015-11-26 NOTE — Progress Notes (Signed)
I have reviewed this pharmacist's note and agree  

## 2015-11-29 ENCOUNTER — Emergency Department (HOSPITAL_BASED_OUTPATIENT_CLINIC_OR_DEPARTMENT_OTHER)
Admission: EM | Admit: 2015-11-29 | Discharge: 2015-11-29 | Disposition: A | Discharge status: Home / Self Care | Payer: 59 | Attending: Emergency Medicine | Admitting: Emergency Medicine

## 2015-11-29 ENCOUNTER — Emergency Department (HOSPITAL_BASED_OUTPATIENT_CLINIC_OR_DEPARTMENT_OTHER): Payer: 59

## 2015-11-29 ENCOUNTER — Encounter (HOSPITAL_BASED_OUTPATIENT_CLINIC_OR_DEPARTMENT_OTHER): Payer: Self-pay

## 2015-11-29 DIAGNOSIS — G2581 Restless legs syndrome: Secondary | ICD-10-CM | POA: Insufficient documentation

## 2015-11-29 DIAGNOSIS — F410 Panic disorder [episodic paroxysmal anxiety] without agoraphobia: Secondary | ICD-10-CM | POA: Diagnosis not present

## 2015-11-29 DIAGNOSIS — I10 Essential (primary) hypertension: Secondary | ICD-10-CM | POA: Insufficient documentation

## 2015-11-29 DIAGNOSIS — Z79899 Other long term (current) drug therapy: Secondary | ICD-10-CM | POA: Insufficient documentation

## 2015-11-29 DIAGNOSIS — R05 Cough: Secondary | ICD-10-CM | POA: Diagnosis not present

## 2015-11-29 DIAGNOSIS — J0110 Acute frontal sinusitis, unspecified: Secondary | ICD-10-CM | POA: Insufficient documentation

## 2015-11-29 DIAGNOSIS — E119 Type 2 diabetes mellitus without complications: Secondary | ICD-10-CM | POA: Diagnosis not present

## 2015-11-29 DIAGNOSIS — Z8701 Personal history of pneumonia (recurrent): Secondary | ICD-10-CM | POA: Insufficient documentation

## 2015-11-29 DIAGNOSIS — E785 Hyperlipidemia, unspecified: Secondary | ICD-10-CM | POA: Diagnosis not present

## 2015-11-29 DIAGNOSIS — Z794 Long term (current) use of insulin: Secondary | ICD-10-CM | POA: Insufficient documentation

## 2015-11-29 DIAGNOSIS — H269 Unspecified cataract: Secondary | ICD-10-CM | POA: Diagnosis not present

## 2015-11-29 DIAGNOSIS — K219 Gastro-esophageal reflux disease without esophagitis: Secondary | ICD-10-CM | POA: Diagnosis not present

## 2015-11-29 DIAGNOSIS — F329 Major depressive disorder, single episode, unspecified: Secondary | ICD-10-CM | POA: Diagnosis not present

## 2015-11-29 DIAGNOSIS — G43909 Migraine, unspecified, not intractable, without status migrainosus: Secondary | ICD-10-CM | POA: Insufficient documentation

## 2015-11-29 DIAGNOSIS — F17210 Nicotine dependence, cigarettes, uncomplicated: Secondary | ICD-10-CM | POA: Diagnosis not present

## 2015-11-29 MED ORDER — BENZONATATE 100 MG PO CAPS
100.0000 mg | ORAL_CAPSULE | Freq: Once | ORAL | Status: AC
  Administered 2015-11-29: 100 mg via ORAL
  Filled 2015-11-29: qty 1

## 2015-11-29 MED ORDER — AMOXICILLIN-POT CLAVULANATE 875-125 MG PO TABS: 1.0000 | ORAL_TABLET | Freq: Two times a day (BID) | ORAL | Status: DC

## 2015-11-29 MED ORDER — BENZONATATE 100 MG PO CAPS: 100.0000 mg | ORAL_CAPSULE | Freq: Three times a day (TID) | ORAL | Status: DC
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
## 2015-11-29 NOTE — ED Notes (Signed)
Pt reports prod cough with green thick sputum, facial pain, pain in teeth, nasal congestion, bilateral ear fullness.

## 2015-11-29 NOTE — ED Notes (Signed)
D/c home with rx x 2 for tessalon and augmentin. Verbalizes understanding to f/u with PCP and have b/p rechecked this week

## 2015-11-29 NOTE — Discharge Instructions (Signed)
Take tylenol 2 pills 4 times a day and motrin 4 pills 3 times a day.  Drink plenty of fluids.  Return for worsening shortness of breath, headache, confusion. Follow up with your family doctor.   Sinusitis, Adult Sinusitis is redness, soreness, and inflammation of the paranasal sinuses. Paranasal sinuses are air pockets within the bones of your face. They are located beneath your eyes, in the middle of your forehead, and above your eyes. In healthy paranasal sinuses, mucus is able to drain out, and air is able to circulate through them by way of your nose. However, when your paranasal sinuses are inflamed, mucus and air can become trapped. This can allow bacteria and other germs to grow and cause infection. Sinusitis can develop quickly and last only a short time (acute) or continue over a long period (chronic). Sinusitis that lasts for more than 12 weeks is considered chronic. CAUSES Causes of sinusitis include:  Allergies.  Structural abnormalities, such as displacement of the cartilage that separates your nostrils (deviated septum), which can decrease the air flow through your nose and sinuses and affect sinus drainage.  Functional abnormalities, such as when the small hairs (cilia) that line your sinuses and help remove mucus do not work properly or are not present. SIGNS AND SYMPTOMS Symptoms of acute and chronic sinusitis are the same. The primary symptoms are pain and pressure around the affected sinuses. Other symptoms include:  Upper toothache.  Earache.  Headache.  Bad breath.  Decreased sense of smell and taste.  A cough, which worsens when you are lying flat.  Fatigue.  Fever.  Thick drainage from your nose, which often is green and may contain pus (purulent).  Swelling and warmth over the affected sinuses. DIAGNOSIS Your health care provider will perform a physical exam. During your exam, your health care provider may perform any of the following to help determine if  you have acute sinusitis or chronic sinusitis:  Look in your nose for signs of abnormal growths in your nostrils (nasal polyps).  Tap over the affected sinus to check for signs of infection.  View the inside of your sinuses using an imaging device that has a light attached (endoscope). If your health care provider suspects that you have chronic sinusitis, one or more of the following tests may be recommended:  Allergy tests.  Nasal culture. A sample of mucus is taken from your nose, sent to a lab, and screened for bacteria.  Nasal cytology. A sample of mucus is taken from your nose and examined by your health care provider to determine if your sinusitis is related to an allergy. TREATMENT Most cases of acute sinusitis are related to a viral infection and will resolve on their own within 10 days. Sometimes, medicines are prescribed to help relieve symptoms of both acute and chronic sinusitis. These may include pain medicines, decongestants, nasal steroid sprays, or saline sprays. However, for sinusitis related to a bacterial infection, your health care provider will prescribe antibiotic medicines. These are medicines that will help kill the bacteria causing the infection. Rarely, sinusitis is caused by a fungal infection. In these cases, your health care provider will prescribe antifungal medicine. For some cases of chronic sinusitis, surgery is needed. Generally, these are cases in which sinusitis recurs more than 3 times per year, despite other treatments. HOME CARE INSTRUCTIONS  Drink plenty of water. Water helps thin the mucus so your sinuses can drain more easily.  Use a humidifier.  Inhale steam 3-4 times a day (  for example, sit in the bathroom with the shower running).  Apply a warm, moist washcloth to your face 3-4 times a day, or as directed by your health care provider.  Use saline nasal sprays to help moisten and clean your sinuses.  Take medicines only as directed by your  health care provider.  If you were prescribed either an antibiotic or antifungal medicine, finish it all even if you start to feel better. SEEK IMMEDIATE MEDICAL CARE IF:  You have increasing pain or severe headaches.  You have nausea, vomiting, or drowsiness.  You have swelling around your face.  You have vision problems.  You have a stiff neck.  You have difficulty breathing.   This information is not intended to replace advice given to you by your health care provider. Make sure you discuss any questions you have with your health care provider.   Document Released: 11/07/2005 Document Revised: 11/28/2014 Document Reviewed: 11/22/2011 Elsevier Interactive Patient Education [employer].

## 2015-11-30 MED FILL — BENAZEPRIL HCL 40 MG TABLET: 40 | 90 days supply | Qty: 90 | Fill #0

## 2015-11-30 MED FILL — AMOX-CLAV 875-125 MG TABLET: 875-125 | 7 days supply | Qty: 14 | Fill #0

## 2015-11-30 MED FILL — AMLODIPINE BESYLATE 10 MG T: 10 | 90 days supply | Qty: 90 | Fill #0

## 2015-12-04 ENCOUNTER — Other Ambulatory Visit: Payer: Self-pay | Admitting: Family Medicine

## 2015-12-04 MED ORDER — GABAPENTIN 300 MG PO CAPS: ORAL_CAPSULE | ORAL | Status: DC

## 2015-12-04 MED FILL — CIALIS 5 MG TABLET: 5 | 30 days supply | Qty: 30 | Fill #0

## 2015-12-07 ENCOUNTER — Other Ambulatory Visit: Payer: Self-pay | Admitting: Family Medicine

## 2015-12-07 MED ORDER — GABAPENTIN 300 MG PO CAPS: ORAL_CAPSULE | ORAL | Status: DC

## 2015-12-09 MED FILL — GABAPENTIN 300 MG CAPSULE: 300 | 30 days supply | Qty: 270 | Fill #0

## 2015-12-22 ENCOUNTER — Ambulatory Visit (INDEPENDENT_AMBULATORY_CARE_PROVIDER_SITE_OTHER): Payer: 59 | Admitting: Family Medicine

## 2015-12-22 ENCOUNTER — Encounter: Payer: Self-pay | Admitting: Family Medicine

## 2015-12-22 VITALS — BP 160/102 | HR 88 | Temp 98.1°F | Ht 70.0 in | Wt 204.5 lb

## 2015-12-22 DIAGNOSIS — E1040 Type 1 diabetes mellitus with diabetic neuropathy, unspecified: Secondary | ICD-10-CM

## 2015-12-22 DIAGNOSIS — M25562 Pain in left knee: Secondary | ICD-10-CM

## 2015-12-22 DIAGNOSIS — I10 Essential (primary) hypertension: Secondary | ICD-10-CM | POA: Diagnosis not present

## 2015-12-22 DIAGNOSIS — R7989 Other specified abnormal findings of blood chemistry: Secondary | ICD-10-CM

## 2015-12-22 DIAGNOSIS — E785 Hyperlipidemia, unspecified: Secondary | ICD-10-CM

## 2015-12-22 DIAGNOSIS — R252 Cramp and spasm: Secondary | ICD-10-CM

## 2015-12-22 DIAGNOSIS — E291 Testicular hypofunction: Secondary | ICD-10-CM

## 2015-12-22 DIAGNOSIS — Z716 Tobacco abuse counseling: Secondary | ICD-10-CM

## 2015-12-22 DIAGNOSIS — M25561 Pain in right knee: Secondary | ICD-10-CM

## 2015-12-22 DIAGNOSIS — E041 Nontoxic single thyroid nodule: Secondary | ICD-10-CM

## 2015-12-22 DIAGNOSIS — IMO0002 Reserved for concepts with insufficient information to code with codable children: Secondary | ICD-10-CM

## 2015-12-22 DIAGNOSIS — E1065 Type 1 diabetes mellitus with hyperglycemia: Secondary | ICD-10-CM

## 2015-12-22 MED ORDER — TRAMADOL HCL 50 MG PO TABS: ORAL_TABLET | ORAL | Status: DC

## 2015-12-22 MED ORDER — METOPROLOL SUCCINATE ER 25 MG PO TB24: 25.0000 mg | ORAL_TABLET | Freq: Every day | ORAL | Status: DC

## 2015-12-22 MED ORDER — DIAZEPAM 5 MG PO TABS: ORAL_TABLET | ORAL | Status: DC
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
## 2015-12-22 NOTE — Patient Instructions (Addendum)
Smoking Cessation, Tips for Success If you are ready to quit smoking, congratulations! You have chosen to help yourself be healthier. Cigarettes bring nicotine, tar, carbon monoxide, and other irritants into your body. Your lungs, heart, and blood vessels will be able to work better without these poisons. There are many different ways to quit smoking. Nicotine gum, nicotine patches, a nicotine inhaler, or nicotine nasal spray can help with physical craving. Hypnosis, support groups, and medicines help break the habit of smoking. WHAT THINGS CAN I DO TO MAKE QUITTING EASIER?  Here are some tips to help you quit for good:  Pick a date when you will quit smoking completely. Tell all of your friends and family about your plan to quit on that date.  Do not try to slowly cut down on the number of cigarettes you are smoking. Pick a quit date and quit smoking completely starting on that day.  Throw away all cigarettes.   Clean and remove all ashtrays from your home, work, and car.  On a card, write down your reasons for quitting. Carry the card with you and read it when you get the urge to smoke.  Cleanse your body of nicotine. Drink enough water and fluids to keep your urine clear or pale yellow. Do this after quitting to flush the nicotine from your body.  Learn to predict your moods. Do not let a bad situation be your excuse to have a cigarette. Some situations in your life might tempt you into wanting a cigarette.  Never have "just one" cigarette. It leads to wanting another and another. Remind yourself of your decision to quit.  Change habits associated with smoking. If you smoked while driving or when feeling stressed, try other activities to replace smoking. Stand up when drinking your coffee. Brush your teeth after eating. Sit in a different chair when you read the paper. Avoid alcohol while trying to quit, and try to drink fewer caffeinated beverages. Alcohol and caffeine may urge you to  smoke.  Avoid foods and drinks that can trigger a desire to smoke, such as sugary or spicy foods and alcohol.  Ask people who smoke not to smoke around you.  Have something planned to do right after eating or having a cup of coffee. For example, plan to take a walk or exercise.  Try a relaxation exercise to calm you down and decrease your stress. Remember, you may be tense and nervous for the first 2 weeks after you quit, but this will pass.  Find new activities to keep your hands busy. Play with a pen, coin, or rubber band. Doodle or draw things on paper.  Brush your teeth right after eating. This will help cut down on the craving for the taste of tobacco after meals. You can also try mouthwash.   Use oral substitutes in place of cigarettes. Try using lemon drops, carrots, cinnamon sticks, or chewing gum. Keep them handy so they are available when you have the urge to smoke.  When you have the urge to smoke, try deep breathing.  Designate your home as a nonsmoking area.  If you are a heavy smoker, ask your health care provider about a prescription for nicotine chewing gum. It can ease your withdrawal from nicotine.  Reward yourself. Set aside the cigarette money you save and buy yourself something nice.  Look for support from others. Join a support group or smoking cessation program. Ask someone at home or at work to help you with your plan   to quit smoking.  Always ask yourself, "Do I need this cigarette or is this just a reflex?" Tell yourself, "Today, I choose not to smoke," or "I do not want to smoke." You are reminding yourself of your decision to quit.  Do not replace cigarette smoking with electronic cigarettes (commonly called e-cigarettes). The safety of e-cigarettes is unknown, and some may contain harmful chemicals.  If you relapse, do not give up! Plan ahead and think about what you will do the next time you get the urge to smoke. HOW WILL I FEEL WHEN I QUIT SMOKING? You  may have symptoms of withdrawal because your body is used to nicotine (the addictive substance in cigarettes). You may crave cigarettes, be irritable, feel very hungry, cough often, get headaches, or have difficulty concentrating. The withdrawal symptoms are only temporary. They are strongest when you first quit but will go away within 10-14 days. When withdrawal symptoms occur, stay in control. Think about your reasons for quitting. Remind yourself that these are signs that your body is healing and getting used to being without cigarettes. Remember that withdrawal symptoms are easier to treat than the major diseases that smoking can cause.  Even after the withdrawal is over, expect periodic urges to smoke. However, these cravings are generally short lived and will go away whether you smoke or not. Do not smoke! WHAT RESOURCES ARE AVAILABLE TO HELP ME QUIT SMOKING? Your health care provider can direct you to community resources or hospitals for support, which may include:  Group support.  Education.  Hypnosis.  Therapy.   This information is not intended to replace advice given to you by your health care provider. Make sure you discuss any questions you have with your health care provider.   Document Released: 08/05/2004 Document Revised: 11/28/2014 Document Reviewed: 04/25/2013 Elsevier Interactive Patient Education 2016 Elsevier Inc[employer] Hypertension Hypertension, commonly called high blood pressure, is when the force of blood pumping through your arteries is too strong. Your arteries are the blood vessels that carry blood from your heart throughout your body. A blood pressure reading consists of a higher number over a lower number, such as 110/72. The higher number (systolic) is the pressure inside your arteries when your heart pumps. The lower number (diastolic) is the pressure inside your arteries when your heart relaxes. Ideally you want your blood pressure below 120/80. Hypertension forces  your heart to work harder to pump blood. Your arteries may become narrow or stiff. Having untreated or uncontrolled hypertension can cause heart attack, stroke, kidney disease, and other problems. RISK FACTORS Some risk factors for high blood pressure are controllable. Others are not.  Risk factors you cannot control include:   Race. You may be at higher risk if you are African American.  Age. Risk increases with age.  Gender. Men are at higher risk than women before age 45 years. After age 65, women are at higher risk than men. Risk factors you can control include:  Not getting enough exercise or physical activity.  Being overweight.  Getting too much fat, sugar, calories, or salt in your diet.  Drinking too much alcohol. SIGNS AND SYMPTOMS Hypertension does not usually cause signs or symptoms. Extremely high blood pressure (hypertensive crisis) may cause headache, anxiety, shortness of breath, and nosebleed. DIAGNOSIS To check if you have hypertension, your health care provider will measure your blood pressure while you are seated, with your arm held at the level of your heart. It should be measured at least twice using   the same arm. Certain conditions can cause a difference in blood pressure between your right and left arms. A blood pressure reading that is higher than normal on one occasion does not mean that you need treatment. If it is not clear whether you have high blood pressure, you may be asked to return on a different day to have your blood pressure checked again. Or, you may be asked to monitor your blood pressure at home for 1 or more weeks. TREATMENT Treating high blood pressure includes making lifestyle changes and possibly taking medicine. Living a healthy lifestyle can help lower high blood pressure. You may need to change some of your habits. Lifestyle changes may include:  Following the DASH diet. This diet is high in fruits, vegetables, and whole grains. It is low in  salt, red meat, and added sugars.  Keep your sodium intake below 2,300 mg per day.  Getting at least 30-45 minutes of aerobic exercise at least 4 times per week.  Losing weight if necessary.  Not smoking.  Limiting alcoholic beverages.  Learning ways to reduce stress. Your health care provider may prescribe medicine if lifestyle changes are not enough to get your blood pressure under control, and if one of the following is true:  You are 18-59 years of age and your systolic blood pressure is above 140.  You are 60 years of age or older, and your systolic blood pressure is above 150.  Your diastolic blood pressure is above 90.  You have diabetes, and your systolic blood pressure is over 140 or your diastolic blood pressure is over 90.  You have kidney disease and your blood pressure is above 140/90.  You have heart disease and your blood pressure is above 140/90. Your personal target blood pressure may vary depending on your medical conditions, your age, and other factors. HOME CARE INSTRUCTIONS  Have your blood pressure rechecked as directed by your health care provider.   Take medicines only as directed by your health care provider. Follow the directions carefully. Blood pressure medicines must be taken as prescribed. The medicine does not work as well when you skip doses. Skipping doses also puts you at risk for problems.  Do not smoke.   Monitor your blood pressure at home as directed by your health care provider. SEEK MEDICAL CARE IF:   You think you are having a reaction to medicines taken.  You have recurrent headaches or feel dizzy.  You have swelling in your ankles.  You have trouble with your vision. SEEK IMMEDIATE MEDICAL CARE IF:  You develop a severe headache or confusion.  You have unusual weakness, numbness, or feel faint.  You have severe chest or abdominal pain.  You vomit repeatedly.  You have trouble breathing. MAKE SURE YOU:   Understand  these instructions.  Will watch your condition.  Will get help right away if you are not doing well or get worse.   This information is not intended to replace advice given to you by your health care provider. Make sure you discuss any questions you have with your health care provider.   Document Released: 11/07/2005 Document Revised: 03/24/2015 Document Reviewed: 08/30/2013 Elsevier Interactive Patient Education 2016 Elsevier Inc.  

## 2015-12-22 NOTE — Progress Notes (Signed)
Pre visit review using our clinic review tool, if applicable. No additional management support is needed unless otherwise documented below in the visit note. 

## 2015-12-27 ENCOUNTER — Encounter: Payer: Self-pay | Admitting: Family Medicine

## 2015-12-27 NOTE — Assessment & Plan Note (Signed)
Encouraged heart healthy diet, increase exercise, avoid trans fats, consider a krill oil cap daily 

## 2015-12-27 NOTE — Assessment & Plan Note (Signed)
Ordered hgba1c, patient following with endocrinology but has not been in awhile. Encouraged to go back to manage his diabetes actively

## 2015-12-27 NOTE — Assessment & Plan Note (Signed)
Encouraged complete cessation. Discussed need to quit as relates to risk of numerous cancers, cardiac and pulmonary disease as well as neurologic complications. Counseled for greater than 3 minutes 

## 2015-12-27 NOTE — Assessment & Plan Note (Signed)
And ankle pain, allowed refills on Tramadol to use prn

## 2015-12-27 NOTE — Assessment & Plan Note (Addendum)
Poorlycontrolled, add Metoprolol. Encouraged heart healthy diet such as the DASH diet and exercise as tolerated.

## 2016-01-01 ENCOUNTER — Other Ambulatory Visit (INDEPENDENT_AMBULATORY_CARE_PROVIDER_SITE_OTHER): Payer: 59

## 2016-01-01 DIAGNOSIS — E1040 Type 1 diabetes mellitus with diabetic neuropathy, unspecified: Secondary | ICD-10-CM | POA: Diagnosis not present

## 2016-01-01 DIAGNOSIS — E1065 Type 1 diabetes mellitus with hyperglycemia: Secondary | ICD-10-CM

## 2016-01-01 DIAGNOSIS — R7989 Other specified abnormal findings of blood chemistry: Secondary | ICD-10-CM

## 2016-01-01 DIAGNOSIS — E291 Testicular hypofunction: Secondary | ICD-10-CM | POA: Diagnosis not present

## 2016-01-01 DIAGNOSIS — I10 Essential (primary) hypertension: Secondary | ICD-10-CM | POA: Diagnosis not present

## 2016-01-01 DIAGNOSIS — E041 Nontoxic single thyroid nodule: Secondary | ICD-10-CM

## 2016-01-01 DIAGNOSIS — R252 Cramp and spasm: Secondary | ICD-10-CM | POA: Diagnosis not present

## 2016-01-01 DIAGNOSIS — E785 Hyperlipidemia, unspecified: Secondary | ICD-10-CM

## 2016-01-01 DIAGNOSIS — IMO0002 Reserved for concepts with insufficient information to code with codable children: Secondary | ICD-10-CM

## 2016-01-01 LAB — COMPREHENSIVE METABOLIC PANEL
ALBUMIN: 4.4 g/dL (ref 3.5–5.2)
ALK PHOS: 89 U/L (ref 39–117)
ALT: 20 U/L (ref 0–53)
AST: 19 U/L (ref 0–37)
BILIRUBIN TOTAL: 0.4 mg/dL (ref 0.2–1.2)
BUN: 13 mg/dL (ref 6–23)
CALCIUM: 9.7 mg/dL (ref 8.4–10.5)
CO2: 32 mEq/L (ref 19–32)
CREATININE: 0.96 mg/dL (ref 0.40–1.50)
Chloride: 102 mEq/L (ref 96–112)
GFR: 89.99 mL/min (ref >60.00)
Glucose, Bld: 78 mg/dL (ref 70–99)
Potassium: 4.2 mEq/L (ref 3.5–5.1)
Sodium: 140 mEq/L (ref 135–145)
Total Protein: 7.3 g/dL (ref 6.0–8.3)

## 2016-01-01 LAB — LIPID PANEL
CHOL/HDL RATIO: 3
CHOLESTEROL: 99 mg/dL (ref 0–200)
HDL: 33.4 mg/dL — ABNORMAL LOW (ref >39.00)
NONHDL: 65.67
TRIGLYCERIDES: 206 mg/dL — AB (ref 0.0–149.0)
VLDL: 41.2 mg/dL — AB (ref 0.0–40.0)

## 2016-01-01 LAB — LDL CHOLESTEROL, DIRECT: LDL DIRECT: 47 mg/dL

## 2016-01-01 LAB — TSH: TSH: 0.98 u[IU]/mL (ref 0.35–4.50)

## 2016-01-01 LAB — VITAMIN D 25 HYDROXY (VIT D DEFICIENCY, FRACTURES): VITD: 16.01 ng/mL — ABNORMAL LOW (ref 30.00–100.00)

## 2016-01-01 LAB — HEMOGLOBIN A1C: HEMOGLOBIN A1C: 8.1 % — AB (ref 4.6–6.5)

## 2016-01-01 LAB — MAGNESIUM: Magnesium: 1.9 mg/dL (ref 1.5–2.5)

## 2016-01-02 LAB — CBC
HCT: 45.2 % (ref 39.0–52.0)
Hemoglobin: 15.3 g/dL (ref 13.0–17.0)
MCHC: 33.8 g/dL (ref 30.0–36.0)
MCV: 93.2 fl (ref 78.0–100.0)
Platelets: 181 10*3/uL (ref 150.0–400.0)
RBC: 4.85 Mil/uL (ref 4.22–5.81)
RDW: 13.5 % (ref 11.5–15.5)
WBC: 7.6 10*3/uL (ref 4.0–10.5)

## 2016-01-04 ENCOUNTER — Other Ambulatory Visit: Payer: Self-pay | Admitting: Family Medicine

## 2016-01-04 LAB — TESTOS,TOTAL,FREE AND SHBG (FEMALE)
SEX HORMONE BINDING GLOB.: 37 nmol/L (ref 10–50)
Testosterone, Free: 65.4 pg/mL (ref 35.0–155.0)
Testosterone,Total,LC/MS/MS: 439 ng/dL (ref 250–1100)

## 2016-01-04 MED ORDER — ERGOCALCIFEROL 1.25 MG (50000 UT) PO CAPS: 50000.0000 [IU] | ORAL_CAPSULE | ORAL | Status: DC

## 2016-01-04 MED FILL — VIT D2 1.25 MG (50,000 UNIT: 1.25 MG | 84 days supply | Qty: 12 | Fill #0

## 2016-01-05 ENCOUNTER — Other Ambulatory Visit: Payer: Self-pay | Admitting: Family Medicine

## 2016-01-05 MED FILL — NovoLOG 100 UNIT/ML SOLN: 100 | 36 days supply | Qty: 50 | Fill #1

## 2016-01-05 MED FILL — DULoxetine HCL 60 MG CPEP: 60 | 90 days supply | Qty: 90 | Fill #1

## 2016-01-05 MED FILL — CIALIS 5 MG TABLET: 5 | 30 days supply | Qty: 30 | Fill #0

## 2016-01-05 MED FILL — METOPROLOL SUCC ER 25 MG TA: 25 | 60 days supply | Qty: 60 | Fill #0

## 2016-01-07 ENCOUNTER — Encounter: Payer: Self-pay | Admitting: Family Medicine

## 2016-01-22 ENCOUNTER — Other Ambulatory Visit: Payer: Self-pay | Admitting: Family Medicine

## 2016-01-22 MED ORDER — DIAZEPAM 5 MG PO TABS: ORAL_TABLET | ORAL | Status: DC

## 2016-01-22 MED ORDER — GABAPENTIN 300 MG PO CAPS: ORAL_CAPSULE | ORAL | Status: DC

## 2016-01-22 MED ORDER — TRAMADOL HCL 50 MG PO TABS: ORAL_TABLET | ORAL | Status: DC

## 2016-01-22 MED FILL — ATORVASTATIN 80 MG TABLET: 80 | 90 days supply | Qty: 90 | Fill #0

## 2016-01-22 MED FILL — diazePAM 5 MG TABS: 5 | 30 days supply | Qty: 60 | Fill #0 | Status: TO

## 2016-01-22 MED FILL — GABAPENTIN 300 MG CAPSULE: 300 | 30 days supply | Qty: 270 | Fill #0

## 2016-01-22 MED FILL — traMADol HCL 50 MG TABS: 50 | 30 days supply | Qty: 90 | Fill #0

## 2016-01-22 NOTE — Telephone Encounter (Signed)
Faxed hardcopy for Tramadol and Valium to [facility].

## 2016-02-01 ENCOUNTER — Other Ambulatory Visit: Payer: Self-pay | Admitting: Family Medicine

## 2016-02-01 MED FILL — PANTOPRAZOLE SOD DR 40 MG T: 40 | 90 days supply | Qty: 90 | Fill #0

## 2016-02-08 ENCOUNTER — Ambulatory Visit: Payer: Self-pay | Admitting: Pharmacist

## 2016-02-10 ENCOUNTER — Encounter: Payer: Self-pay | Admitting: Pharmacist

## 2016-02-12 ENCOUNTER — Encounter: Payer: Self-pay | Admitting: Pharmacist

## 2016-02-12 ENCOUNTER — Ambulatory Visit (INDEPENDENT_AMBULATORY_CARE_PROVIDER_SITE_OTHER): Payer: Self-pay | Admitting: Family Medicine

## 2016-02-12 VITALS — BP 139/85 | HR 77 | Ht 70.0 in | Wt 204.6 lb

## 2016-02-12 DIAGNOSIS — E1040 Type 1 diabetes mellitus with diabetic neuropathy, unspecified: Secondary | ICD-10-CM

## 2016-02-12 DIAGNOSIS — E1065 Type 1 diabetes mellitus with hyperglycemia: Secondary | ICD-10-CM

## 2016-02-12 DIAGNOSIS — IMO0002 Reserved for concepts with insufficient information to code with codable children: Secondary | ICD-10-CM

## 2016-02-12 NOTE — Progress Notes (Signed)
Subjective:  Patient presents today for 3 month diabetes follow-up as part of the employer-sponsored Link to Wellness program.  Current diabetes regimen includes Novolog via pump. Patient also continues on daily statin and ARB. No ASA due to excessive bleeding. Most recent PCP follow up was 12/22/15, most recent endo visit was 08/12/15. Patient has a pending appt with PCP in 1 month and no scheduled appointment with endo. No significant med or health changes at this time.  Patient is feeling overwhelmed with a pending surgery for wife (02/16/16) and general lack of feeling that he can effectively manage his diabetes.  Assessment:  Diabetes: Most recent A1C was 8.1% which is exceeding goal of less than 7%. Weight is stable from last visit with me.   Lifestyle improvements:  Physical Activity- Still continues to walk regularly.   Nutrition- Diet has remained consistent. Continues to have some days where he does not eat until late afternoon/early evening and tends to have significant carbohydrate consumption whenever he eats.   Follow up with me in 3 months.    Plan/Goals for Next Visit:  1. Make appointment with Dwyane Dee 2. [place] appointment for 4/28 3. Keep working with sensor; take sensor to next appointment with Dwyane Dee and ask to meet with the pump specialist there and have her analyze your insertion technique.  4. While you're working on improving sensor compliance, increase frequency of checking blood glucose and bolus accordingly.

## 2016-02-16 DIAGNOSIS — E109 Type 1 diabetes mellitus without complications: Secondary | ICD-10-CM | POA: Diagnosis not present

## 2016-02-22 ENCOUNTER — Other Ambulatory Visit: Payer: Self-pay | Admitting: Endocrinology

## 2016-02-22 ENCOUNTER — Other Ambulatory Visit: Payer: Self-pay | Admitting: Family Medicine

## 2016-02-22 MED ORDER — INSULIN ASPART 100 UNIT/ML ~~LOC~~ SOLN: SUBCUTANEOUS | Status: DC

## 2016-02-22 MED FILL — NovoLOG 100 UNIT/ML SOLN: 100 | 29 days supply | Qty: 40 | Fill #0

## 2016-02-22 NOTE — Telephone Encounter (Signed)
Advise if ok to send in 

## 2016-02-22 NOTE — Telephone Encounter (Signed)
He can have one 30 day supply of insulin to get him through but then he needs an appointment with endocrinology for follow up

## 2016-02-22 NOTE — Telephone Encounter (Signed)
PCP did ok a #30 day supply only, note to pharmacy patient must schedule appt. With his Endo.

## 2016-02-22 NOTE — Telephone Encounter (Signed)
Reason for call: Pt calling to see if Dr. Charlett Blake can get a refill on novolog thru Dr. Charlett Blake. His endocrinologist will not feel bc he needs to be seen. Pt has 1-2 days of insulin left. He is hoping for [place] to send down to [facility] asap to pick up today. Dr. Dwyane Dee was the doctor who will not fill. Pt can't drive, his wife can't drive. They have to get rides everywhere. His son is coming to our building and he can pick up insulin downstairs. He said Suezanne Jacquet from [employer] is faxing up RX request.

## 2016-02-24 ENCOUNTER — Other Ambulatory Visit: Payer: Self-pay | Admitting: Family Medicine

## 2016-02-25 ENCOUNTER — Other Ambulatory Visit: Payer: Self-pay | Admitting: Family Medicine

## 2016-02-25 MED ORDER — TRAMADOL HCL 50 MG PO TABS: ORAL_TABLET | ORAL | Status: DC

## 2016-02-25 MED FILL — traMADol HCL 50 MG TABS: 50 | 30 days supply | Qty: 90 | Fill #0

## 2016-02-25 MED FILL — diazePAM 5 MG TABS: 5 | 30 days supply | Qty: 60 | Fill #0

## 2016-02-25 NOTE — Telephone Encounter (Signed)
Faxed tramadol to [facility].

## 2016-02-25 NOTE — Telephone Encounter (Signed)
Printed and on counter for signature. 

## 2016-03-01 ENCOUNTER — Telehealth: Payer: Self-pay | Admitting: Endocrinology

## 2016-03-01 NOTE — Telephone Encounter (Signed)
Denied, he was last seen in [place] of 2016, he's been seeing Dr. Erin Hearing for his Diabetes, he was last seen there on

## 2016-03-01 NOTE — Telephone Encounter (Signed)
Patient has always been non-compliant on follow ups. Dr. Dwyane Dee said to [facility].

## 2016-03-01 NOTE — Telephone Encounter (Signed)
Pt needs refill on test strips and pump supplies for his medtronic pump and the contour meter call into [facility] pharmacy  Doesn't have a drivers license and his wife is not well that is why he has not been able to make it in.

## 2016-03-01 NOTE — Telephone Encounter (Signed)
He has been seen couple of times this year by PCP and wellness link and was recommended follow-up here.  He can have test strips for 15 days only

## 2016-03-01 NOTE — Telephone Encounter (Signed)
Patient stated the he need a Prior Auth for medication test strips, Patient stated that he cannog

## 2016-03-01 NOTE — Telephone Encounter (Signed)
Please see below, you denied his supplies a couple weeks ago.

## 2016-03-02 ENCOUNTER — Other Ambulatory Visit: Payer: Self-pay | Admitting: *Deleted

## 2016-03-02 MED ORDER — GLUCOSE BLOOD VI STRP: ORAL_STRIP | Status: DC

## 2016-03-02 NOTE — Telephone Encounter (Signed)
rx sent for test strips 30 day supply only.

## 2016-03-07 MED FILL — CONTOUR NEXT STRIPS: 25 days supply | Qty: 200 | Fill #0

## 2016-03-07 MED FILL — MICROLET LANCETS: 25 days supply | Qty: 200 | Fill #0

## 2016-03-11 ENCOUNTER — Other Ambulatory Visit: Payer: Self-pay | Admitting: Family Medicine

## 2016-03-11 MED FILL — BENAZEPRIL HCL 40 MG TABLET: 40 | 90 days supply | Qty: 90 | Fill #0

## 2016-03-11 MED FILL — METOPROLOL SUCC ER 25 MG TA: 25 | 60 days supply | Qty: 60 | Fill #0

## 2016-03-11 MED FILL — AMLODIPINE BESYLATE 10 MG T: 10 | 90 days supply | Qty: 90 | Fill #0

## 2016-03-15 ENCOUNTER — Ambulatory Visit: Payer: Self-pay | Admitting: Endocrinology

## 2016-03-15 ENCOUNTER — Encounter: Payer: Self-pay | Admitting: *Deleted

## 2016-03-15 DIAGNOSIS — Z0289 Encounter for other administrative examinations: Secondary | ICD-10-CM

## 2016-03-16 ENCOUNTER — Telehealth: Payer: Self-pay | Admitting: Endocrinology

## 2016-03-16 NOTE — Telephone Encounter (Signed)
Patient no showed today's appt. Please advise on how to follow up. °A. No follow up necessary. °B. Follow up urgent. Contact patient immediately. °C. Follow up necessary. Contact patient and schedule visit in ___ days. °D. Follow up advised. Contact patient and schedule visit in ____weeks. ° °

## 2016-03-16 NOTE — Telephone Encounter (Signed)
Letter mailed

## 2016-03-18 ENCOUNTER — Telehealth: Payer: Self-pay | Admitting: Family Medicine

## 2016-03-18 ENCOUNTER — Ambulatory Visit: Payer: 59 | Admitting: Family Medicine

## 2016-03-23 NOTE — Telephone Encounter (Signed)
Pt was no show 03/18/16 2:00pm for mychart appt, pt has rescheduled thru mychart for 5/5 stating he forgot appt, charge or no charge?

## 2016-03-23 NOTE — Telephone Encounter (Signed)
No charge. 

## 2016-03-25 ENCOUNTER — Encounter: Payer: Self-pay | Admitting: Family Medicine

## 2016-03-25 ENCOUNTER — Ambulatory Visit (INDEPENDENT_AMBULATORY_CARE_PROVIDER_SITE_OTHER): Payer: 59 | Admitting: Family Medicine

## 2016-03-25 VITALS — BP 138/92 | HR 83 | Temp 98.0°F | Ht 70.0 in | Wt 195.5 lb

## 2016-03-25 DIAGNOSIS — I10 Essential (primary) hypertension: Secondary | ICD-10-CM

## 2016-03-25 DIAGNOSIS — E569 Vitamin deficiency, unspecified: Secondary | ICD-10-CM

## 2016-03-25 DIAGNOSIS — Z716 Tobacco abuse counseling: Secondary | ICD-10-CM

## 2016-03-25 DIAGNOSIS — R7989 Other specified abnormal findings of blood chemistry: Secondary | ICD-10-CM

## 2016-03-25 DIAGNOSIS — E1065 Type 1 diabetes mellitus with hyperglycemia: Secondary | ICD-10-CM

## 2016-03-25 DIAGNOSIS — E0842 Diabetes mellitus due to underlying condition with diabetic polyneuropathy: Secondary | ICD-10-CM | POA: Diagnosis not present

## 2016-03-25 DIAGNOSIS — E1040 Type 1 diabetes mellitus with diabetic neuropathy, unspecified: Secondary | ICD-10-CM | POA: Diagnosis not present

## 2016-03-25 DIAGNOSIS — R1013 Epigastric pain: Secondary | ICD-10-CM

## 2016-03-25 DIAGNOSIS — E785 Hyperlipidemia, unspecified: Secondary | ICD-10-CM

## 2016-03-25 DIAGNOSIS — R634 Abnormal weight loss: Secondary | ICD-10-CM

## 2016-03-25 DIAGNOSIS — R630 Anorexia: Secondary | ICD-10-CM

## 2016-03-25 DIAGNOSIS — IMO0002 Reserved for concepts with insufficient information to code with codable children: Secondary | ICD-10-CM

## 2016-03-25 DIAGNOSIS — E291 Testicular hypofunction: Secondary | ICD-10-CM

## 2016-03-25 HISTORY — DX: Abnormal weight loss: R63.4

## 2016-03-25 MED ORDER — METOPROLOL SUCCINATE ER 50 MG PO TB24: 50.0000 mg | ORAL_TABLET | Freq: Every day | ORAL | Status: DC

## 2016-03-25 MED ORDER — DIAZEPAM 5 MG PO TABS: ORAL_TABLET | ORAL | Status: DC

## 2016-03-25 MED ORDER — TRAMADOL HCL 50 MG PO TABS: ORAL_TABLET | ORAL | Status: DC

## 2016-03-25 MED ORDER — INSULIN ASPART 100 UNIT/ML ~~LOC~~ SOLN: SUBCUTANEOUS | Status: DC

## 2016-03-25 MED FILL — diazePAM 5 MG TABS: 5 | 30 days supply | Qty: 60 | Fill #0

## 2016-03-25 MED FILL — METOPROLOL SUCC ER 50 MG TA: 50 | 90 days supply | Qty: 90 | Fill #0 | Status: TO

## 2016-03-25 MED FILL — NovoLOG 100 UNIT/ML SOLN: 100 | 28 days supply | Qty: 40 | Fill #0

## 2016-03-25 MED FILL — traMADol HCL 50 MG TABS: 50 | 30 days supply | Qty: 90 | Fill #0

## 2016-03-25 NOTE — Progress Notes (Signed)
Pre visit review using our clinic review tool, if applicable. No additional management support is needed unless otherwise documented below in the visit note. 

## 2016-03-25 NOTE — Assessment & Plan Note (Signed)
Unclear etiology, encouraged increased po intake and check H Pylori amylase etc, if no cause found will proceed with CT scan

## 2016-03-25 NOTE — Assessment & Plan Note (Signed)
Check level next week

## 2016-03-25 NOTE — Assessment & Plan Note (Signed)
Encouraged complete cessation. Discussed need to quit as relates to risk of numerous cancers, cardiac and pulmonary disease as well as neurologic complications. Counseled for greater than 3 minutes 
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
## 2016-03-25 NOTE — Patient Instructions (Signed)
Hypertension Hypertension, commonly called high blood pressure, is when the force of blood pumping through your arteries is too strong. Your arteries are the blood vessels that carry blood from your heart throughout your body. A blood pressure reading consists of a higher number over a lower number, such as 110/72. The higher number (systolic) is the pressure inside your arteries when your heart pumps. The lower number (diastolic) is the pressure inside your arteries when your heart relaxes. Ideally you want your blood pressure below 120/80. Hypertension forces your heart to work harder to pump blood. Your arteries may become narrow or stiff. Having untreated or uncontrolled hypertension can cause heart attack, stroke, kidney disease, and other problems. RISK FACTORS Some risk factors for high blood pressure are controllable. Others are not.  Risk factors you cannot control include:   Race. You may be at higher risk if you are African American.  Age. Risk increases with age.  Gender. Men are at higher risk than women before age 45 years. After age 65, women are at higher risk than men. Risk factors you can control include:  Not getting enough exercise or physical activity.  Being overweight.  Getting too much fat, sugar, calories, or salt in your diet.  Drinking too much alcohol. SIGNS AND SYMPTOMS Hypertension does not usually cause signs or symptoms. Extremely high blood pressure (hypertensive crisis) may cause headache, anxiety, shortness of breath, and nosebleed. DIAGNOSIS To check if you have hypertension, your health care provider will measure your blood pressure while you are seated, with your arm held at the level of your heart. It should be measured at least twice using the same arm. Certain conditions can cause a difference in blood pressure between your right and left arms. A blood pressure reading that is higher than normal on one occasion does not mean that you need treatment. If  it is not clear whether you have high blood pressure, you may be asked to return on a different day to have your blood pressure checked again. Or, you may be asked to monitor your blood pressure at home for 1 or more weeks. TREATMENT Treating high blood pressure includes making lifestyle changes and possibly taking medicine. Living a healthy lifestyle can help lower high blood pressure. You may need to change some of your habits. Lifestyle changes may include:  Following the DASH diet. This diet is high in fruits, vegetables, and whole grains. It is low in salt, red meat, and added sugars.  Keep your sodium intake below 2,300 mg per day.  Getting at least 30-45 minutes of aerobic exercise at least 4 times per week.  Losing weight if necessary.  Not smoking.  Limiting alcoholic beverages.  Learning ways to reduce stress. Your health care provider may prescribe medicine if lifestyle changes are not enough to get your blood pressure under control, and if one of the following is true:  You are 18-59 years of age and your systolic blood pressure is above 140.  You are 60 years of age or older, and your systolic blood pressure is above 150.  Your diastolic blood pressure is above 90.  You have diabetes, and your systolic blood pressure is over 140 or your diastolic blood pressure is over 90.  You have kidney disease and your blood pressure is above 140/90.  You have heart disease and your blood pressure is above 140/90. Your personal target blood pressure may vary depending on your medical conditions, your age, and other factors. HOME CARE INSTRUCTIONS    Have your blood pressure rechecked as directed by your health care provider.   Take medicines only as directed by your health care provider. Follow the directions carefully. Blood pressure medicines must be taken as prescribed. The medicine does not work as well when you skip doses. Skipping doses also puts you at risk for  problems.  Do not smoke.   Monitor your blood pressure at home as directed by your health care provider. SEEK MEDICAL CARE IF:   You think you are having a reaction to medicines taken.  You have recurrent headaches or feel dizzy.  You have swelling in your ankles.  You have trouble with your vision. SEEK IMMEDIATE MEDICAL CARE IF:  You develop a severe headache or confusion.  You have unusual weakness, numbness, or feel faint.  You have severe chest or abdominal pain.  You vomit repeatedly.  You have trouble breathing. MAKE SURE YOU:   Understand these instructions.  Will watch your condition.  Will get help right away if you are not doing well or get worse.   This information is not intended to replace advice given to you by your health care provider. Make sure you discuss any questions you have with your health care provider.   Document Released: 11/07/2005 Document Revised: 03/24/2015 Document Reviewed: 08/30/2013 Elsevier Interactive Patient Education 2016 [employer] Inc.  

## 2016-03-25 NOTE — Assessment & Plan Note (Addendum)
Poorly controlled will alter medications, encouraged DASH diet, minimize caffeine and obtain adequate sleep. Report concerning symptoms and follow up as directed and as needed, increase Metoprolol Succinate

## 2016-03-25 NOTE — Assessment & Plan Note (Addendum)
Repeat the hgba1c next hgba1c next week. Refer to new endocrinology at his request, he has had some low blod sugars in the 30s when he has skipped meals, encouraged to eat protein every 4 hours

## 2016-04-06 NOTE — Telephone Encounter (Signed)
LM for pt to call back to reschedule.

## 2016-04-24 ENCOUNTER — Telehealth: Payer: Self-pay | Admitting: Family Medicine

## 2016-04-24 ENCOUNTER — Other Ambulatory Visit: Payer: Self-pay | Admitting: Endocrinology

## 2016-04-25 ENCOUNTER — Telehealth: Payer: Self-pay | Admitting: *Deleted

## 2016-04-25 ENCOUNTER — Other Ambulatory Visit: Payer: Self-pay | Admitting: Family Medicine

## 2016-04-25 ENCOUNTER — Other Ambulatory Visit: Payer: 59

## 2016-04-25 DIAGNOSIS — Z716 Tobacco abuse counseling: Secondary | ICD-10-CM

## 2016-04-25 DIAGNOSIS — E1065 Type 1 diabetes mellitus with hyperglycemia: Secondary | ICD-10-CM | POA: Diagnosis not present

## 2016-04-25 DIAGNOSIS — IMO0002 Reserved for concepts with insufficient information to code with codable children: Secondary | ICD-10-CM

## 2016-04-25 DIAGNOSIS — E0842 Diabetes mellitus due to underlying condition with diabetic polyneuropathy: Secondary | ICD-10-CM | POA: Diagnosis not present

## 2016-04-25 DIAGNOSIS — E291 Testicular hypofunction: Secondary | ICD-10-CM | POA: Diagnosis not present

## 2016-04-25 DIAGNOSIS — Z79891 Long term (current) use of opiate analgesic: Secondary | ICD-10-CM | POA: Diagnosis not present

## 2016-04-25 DIAGNOSIS — I10 Essential (primary) hypertension: Secondary | ICD-10-CM

## 2016-04-25 DIAGNOSIS — Z79899 Other long term (current) drug therapy: Secondary | ICD-10-CM | POA: Diagnosis not present

## 2016-04-25 DIAGNOSIS — R630 Anorexia: Secondary | ICD-10-CM

## 2016-04-25 DIAGNOSIS — E569 Vitamin deficiency, unspecified: Secondary | ICD-10-CM

## 2016-04-25 DIAGNOSIS — R1013 Epigastric pain: Secondary | ICD-10-CM

## 2016-04-25 DIAGNOSIS — E1040 Type 1 diabetes mellitus with diabetic neuropathy, unspecified: Secondary | ICD-10-CM

## 2016-04-25 DIAGNOSIS — E785 Hyperlipidemia, unspecified: Secondary | ICD-10-CM | POA: Diagnosis not present

## 2016-04-25 DIAGNOSIS — R7989 Other specified abnormal findings of blood chemistry: Secondary | ICD-10-CM

## 2016-04-25 LAB — CBC
HCT: 43.3 % (ref 39.0–52.0)
HEMOGLOBIN: 14.7 g/dL (ref 13.0–17.0)
MCHC: 33.9 g/dL (ref 30.0–36.0)
MCV: 93.2 fl (ref 78.0–100.0)
PLATELETS: 163 10*3/uL (ref 150.0–400.0)
RBC: 4.65 Mil/uL (ref 4.22–5.81)
RDW: 13.8 % (ref 11.5–15.5)
WBC: 5.2 10*3/uL (ref 4.0–10.5)

## 2016-04-25 LAB — COMPREHENSIVE METABOLIC PANEL
ALT: 14 U/L (ref 9–46)
AST: 13 U/L (ref 10–40)
Albumin: 4.2 g/dL (ref 3.6–5.1)
Alkaline Phosphatase: 63 U/L (ref 40–115)
BILIRUBIN TOTAL: 0.8 mg/dL (ref 0.2–1.2)
BUN: 12 mg/dL (ref 7–25)
CHLORIDE: 104 mmol/L (ref 98–110)
CO2: 21 mmol/L (ref 20–31)
CREATININE: 0.68 mg/dL (ref 0.60–1.35)
Calcium: 8.9 mg/dL (ref 8.6–10.3)
Glucose, Bld: 141 mg/dL — ABNORMAL HIGH (ref 65–99)
Potassium: 3.8 mmol/L (ref 3.5–5.3)
SODIUM: 137 mmol/L (ref 135–146)
TOTAL PROTEIN: 6.6 g/dL (ref 6.1–8.1)

## 2016-04-25 LAB — H. PYLORI ANTIBODY, IGG: H Pylori IgG: NEGATIVE

## 2016-04-25 LAB — LIPID PANEL
CHOL/HDL RATIO: 2.8 ratio (ref <=5.0)
Cholesterol: 104 mg/dL — ABNORMAL LOW (ref 125–200)
HDL: 37 mg/dL — ABNORMAL LOW (ref >=40)
LDL Cholesterol: 52 mg/dL (ref <130)
Triglycerides: 75 mg/dL (ref <150)
VLDL: 15 mg/dL (ref <30)

## 2016-04-25 LAB — AMYLASE: AMYLASE: 19 U/L (ref 0–105)

## 2016-04-25 LAB — LIPASE: Lipase: 9 U/L (ref 7–60)

## 2016-04-25 MED ORDER — GLUCOSE BLOOD VI STRP: ORAL_STRIP | Status: DC

## 2016-04-25 MED ORDER — TRAMADOL HCL 50 MG PO TABS: ORAL_TABLET | ORAL | Status: DC

## 2016-04-25 MED FILL — diazePAM 5 MG TABS: 5 | 30 days supply | Qty: 60 | Fill #1

## 2016-04-25 MED FILL — GABAPENTIN 300 MG CAPSULE: 300 | 30 days supply | Qty: 270 | Fill #1

## 2016-04-25 MED FILL — CONTOUR NEXT STRIPS: 31 days supply | Qty: 250 | Fill #0

## 2016-04-25 MED FILL — DULoxetine HCL 60 MG CPEP: 60 | 90 days supply | Qty: 90 | Fill #0

## 2016-04-25 MED FILL — traMADol HCL 50 MG TABS: 50 | 30 days supply | Qty: 90 | Fill #0

## 2016-04-25 MED FILL — CIALIS 5 MG TABLET: 5 | 30 days supply | Qty: 30 | Fill #1

## 2016-04-25 NOTE — Telephone Encounter (Signed)
Refill done, proceed with UDS

## 2016-04-25 NOTE — Telephone Encounter (Signed)
.   Pt states has difficulties traveling wants to know if he can do his UDS today for his tramadol refill if needed . Please advise.

## 2016-04-25 NOTE — Telephone Encounter (Signed)
Pt came in office also stating needing traMADol (ULTRAM) 50 MG tablet, if possible for today since pt is here so that way he does not have to travel back to office. Please advise ASAP. Tel [phone]

## 2016-04-25 NOTE — Telephone Encounter (Signed)
Patient left a UDS this am, faxed hardcopy to [facility]

## 2016-04-25 NOTE — Addendum Note (Signed)
Addended by: Peggyann Shoals on: 04/25/2016 11:39 AM   Modules accepted: Orders

## 2016-04-25 NOTE — Telephone Encounter (Signed)
Faxed hardcopy for Tramdol to [facility]. Patient left a UDS this am in the lab.  Patient aware hardcopy faxed in.

## 2016-04-25 NOTE — Addendum Note (Signed)
Addended by: Peggyann Shoals on: 04/25/2016 11:41 AM   Modules accepted: Orders

## 2016-04-26 LAB — TSH: TSH: 0.75 m[IU]/L (ref 0.40–4.50)

## 2016-04-27 LAB — TESTOS,TOTAL,FREE AND SHBG (FEMALE)
Sex Hormone Binding Glob.: 37 nmol/L (ref 10–50)
Testosterone, Free: 71.5 pg/mL (ref 35.0–155.0)
Testosterone,Total,LC/MS/MS: 411 ng/dL (ref 250–1100)

## 2016-05-06 ENCOUNTER — Encounter: Payer: Self-pay | Admitting: Pharmacist

## 2016-05-06 ENCOUNTER — Other Ambulatory Visit: Payer: Self-pay | Admitting: Pharmacist

## 2016-05-06 NOTE — Progress Notes (Signed)
Subjective:  Patient presents today for 3 month diabetes follow-up as part of the employer-sponsored [employer] to Well[employer] program.  Current diabetes regimen includes Novolog via pump.  Patient also continues on daily ACE Inhibitor and statin.  Most recent MD follow-up was 04/23/16. No med changes or major health changes at this time.    Assessment:  Diabetes: Most recent A1C was 8.1% which is exceeding goal of less than 7%. Weight is stable from last visit with me.   Lifestyle improvements:  Physical Activity- Physical activity continues to be around 50 minutes per week of light activity (walking). He is uninterested in increasing this at this time.  Nutrition- Has recently lost some weight then regained a portion. His weight loss was attributed to not feeling like eating and his gain is attributable to feeling like he should eat.  Of note, patient has not seen endocrinology in 9 months and I have explained that he MUST see an endocrinologist Dwyane Dee or other) before I will see him again and before any more insulin will be refilled. He voices understanding and reports that he will schedule it today or Monday.    Plan/Goals for Next Visit:  1. Schedule and visit Endo - Mandatory 2. Schedule and visit eye exam 3. Follow up with me after seeing endo se we can schedule a follow up.

## 2016-05-11 ENCOUNTER — Telehealth: Payer: Self-pay | Admitting: Endocrinology

## 2016-05-11 ENCOUNTER — Other Ambulatory Visit: Payer: Self-pay | Admitting: Family Medicine

## 2016-05-11 ENCOUNTER — Telehealth: Payer: Self-pay | Admitting: Family Medicine

## 2016-05-11 DIAGNOSIS — R1013 Epigastric pain: Secondary | ICD-10-CM

## 2016-05-11 DIAGNOSIS — E569 Vitamin deficiency, unspecified: Secondary | ICD-10-CM

## 2016-05-11 DIAGNOSIS — E1065 Type 1 diabetes mellitus with hyperglycemia: Secondary | ICD-10-CM

## 2016-05-11 DIAGNOSIS — E785 Hyperlipidemia, unspecified: Secondary | ICD-10-CM

## 2016-05-11 DIAGNOSIS — IMO0002 Reserved for concepts with insufficient information to code with codable children: Secondary | ICD-10-CM

## 2016-05-11 DIAGNOSIS — Z716 Tobacco abuse counseling: Secondary | ICD-10-CM

## 2016-05-11 DIAGNOSIS — E0842 Diabetes mellitus due to underlying condition with diabetic polyneuropathy: Secondary | ICD-10-CM

## 2016-05-11 DIAGNOSIS — I10 Essential (primary) hypertension: Secondary | ICD-10-CM

## 2016-05-11 DIAGNOSIS — E1040 Type 1 diabetes mellitus with diabetic neuropathy, unspecified: Secondary | ICD-10-CM

## 2016-05-11 DIAGNOSIS — R7989 Other specified abnormal findings of blood chemistry: Secondary | ICD-10-CM

## 2016-05-11 DIAGNOSIS — R630 Anorexia: Secondary | ICD-10-CM

## 2016-05-11 MED ORDER — INSULIN ASPART 100 UNIT/ML ~~LOC~~ SOLN: SUBCUTANEOUS | Status: DC

## 2016-05-11 MED FILL — ATORVASTATIN 80 MG TABLET: 80 | 90 days supply | Qty: 90 | Fill #1

## 2016-05-11 MED FILL — PANTOPRAZOLE SOD DR 40 MG T: 40 | 90 days supply | Qty: 90 | Fill #1

## 2016-05-11 MED FILL — NovoLOG 100 UNIT/ML SOLN: 100 | 28 days supply | Qty: 40 | Fill #0

## 2016-05-11 NOTE — Telephone Encounter (Signed)
°  Relationship to patient: Self  Can be reached: [phone]  Reason for call: Patient states he does not have enough Insulin to last him until his appointment on Friday and his refill was denied because he needed an appointment.

## 2016-05-11 NOTE — Telephone Encounter (Signed)
error 

## 2016-05-11 NOTE — Telephone Encounter (Signed)
Rx filled by Dr. Lorelei Pont DOD. Left message on answering machine that Rx for Novolog faxed to pharmacy.

## 2016-05-13 ENCOUNTER — Ambulatory Visit: Payer: 59 | Admitting: Family Medicine

## 2016-05-17 ENCOUNTER — Telehealth: Payer: Self-pay | Admitting: Family Medicine

## 2016-05-17 NOTE — Telephone Encounter (Signed)
No charge. 

## 2016-05-17 NOTE — Telephone Encounter (Signed)
Patient was No Show on 6/23. Last No Show was 03/18/16. Charge or No Charge?

## 2016-05-18 ENCOUNTER — Encounter: Payer: Self-pay | Admitting: Family Medicine

## 2016-05-20 ENCOUNTER — Ambulatory Visit (INDEPENDENT_AMBULATORY_CARE_PROVIDER_SITE_OTHER): Payer: 59 | Admitting: Medical

## 2016-05-20 ENCOUNTER — Encounter: Payer: Self-pay | Admitting: Family Medicine

## 2016-05-20 ENCOUNTER — Encounter: Payer: Self-pay | Admitting: Medical

## 2016-05-20 VITALS — BP 126/82 | HR 87 | Ht 70.0 in | Wt 204.0 lb

## 2016-05-20 DIAGNOSIS — F411 Generalized anxiety disorder: Secondary | ICD-10-CM

## 2016-05-20 DIAGNOSIS — E0842 Diabetes mellitus due to underlying condition with diabetic polyneuropathy: Secondary | ICD-10-CM | POA: Diagnosis not present

## 2016-05-20 DIAGNOSIS — IMO0002 Reserved for concepts with insufficient information to code with codable children: Secondary | ICD-10-CM

## 2016-05-20 DIAGNOSIS — E1040 Type 1 diabetes mellitus with diabetic neuropathy, unspecified: Secondary | ICD-10-CM | POA: Diagnosis not present

## 2016-05-20 DIAGNOSIS — E1065 Type 1 diabetes mellitus with hyperglycemia: Secondary | ICD-10-CM

## 2016-05-20 DIAGNOSIS — M2550 Pain in unspecified joint: Secondary | ICD-10-CM

## 2016-05-20 MED ORDER — DIAZEPAM 5 MG PO TABS: ORAL_TABLET | ORAL | Status: DC

## 2016-05-20 MED ORDER — TRAMADOL HCL 50 MG PO TABS: ORAL_TABLET | ORAL | Status: DC

## 2016-05-20 MED FILL — traMADol HCL 50 MG TABS: 50 | 30 days supply | Qty: 90 | Fill #0

## 2016-05-20 MED FILL — diazePAM 5 MG TABS: 5 | 30 days supply | Qty: 60 | Fill #0
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
## 2016-05-20 NOTE — Progress Notes (Signed)
Pre visit review using our clinic review tool, if applicable. No additional management support is needed unless otherwise documented below in the visit note. 

## 2016-05-20 NOTE — Patient Instructions (Addendum)
For your diabetes continue pump and novolog as Dr. Charlett Blake recently advised. Follow up with Dr. Dwyane Dee for diabetes early August.  Your weight loss has reversed. Sometimes with high sugars rapid weight loss can happen. On prior labs no cause found.  For your anxiety will rx valium(you did not have refill per pharmacy). And for your joint pains will rx tramadol. I am going to go ahead and authorize early refill since you are here to avoid having to travel back early next week.  Follow up with Dr. Dwyane Dee and Dr. Charlett Blake as regularly scheduled or as needed

## 2016-05-31 ENCOUNTER — Encounter: Payer: Self-pay | Admitting: Family Medicine

## 2016-06-15 ENCOUNTER — Other Ambulatory Visit (INDEPENDENT_AMBULATORY_CARE_PROVIDER_SITE_OTHER): Payer: 59

## 2016-06-15 DIAGNOSIS — IMO0002 Reserved for concepts with insufficient information to code with codable children: Secondary | ICD-10-CM

## 2016-06-15 DIAGNOSIS — E1065 Type 1 diabetes mellitus with hyperglycemia: Secondary | ICD-10-CM

## 2016-06-15 DIAGNOSIS — E1049 Type 1 diabetes mellitus with other diabetic neurological complication: Secondary | ICD-10-CM | POA: Diagnosis not present

## 2016-06-15 LAB — COMPREHENSIVE METABOLIC PANEL
ALK PHOS: 75 U/L (ref 39–117)
ALT: 14 U/L (ref 0–53)
AST: 15 U/L (ref 0–37)
Albumin: 4.2 g/dL (ref 3.5–5.2)
BILIRUBIN TOTAL: 1.1 mg/dL (ref 0.2–1.2)
BUN: 12 mg/dL (ref 6–23)
CO2: 28 mEq/L (ref 19–32)
CREATININE: 0.86 mg/dL (ref 0.40–1.50)
Calcium: 9.4 mg/dL (ref 8.4–10.5)
Chloride: 105 mEq/L (ref 96–112)
GFR: 101.96 mL/min (ref >60.00)
GLUCOSE: 173 mg/dL — AB (ref 70–99)
Potassium: 4.3 mEq/L (ref 3.5–5.1)
SODIUM: 139 meq/L (ref 135–145)
TOTAL PROTEIN: 6.9 g/dL (ref 6.0–8.3)

## 2016-06-15 LAB — HEMOGLOBIN A1C: Hgb A1c MFr Bld: 8.4 % — ABNORMAL HIGH (ref 4.6–6.5)

## 2016-06-23 ENCOUNTER — Encounter: Payer: Self-pay | Admitting: Endocrinology

## 2016-06-23 ENCOUNTER — Ambulatory Visit (INDEPENDENT_AMBULATORY_CARE_PROVIDER_SITE_OTHER): Payer: 59 | Admitting: Endocrinology

## 2016-06-23 VITALS — BP 118/70 | HR 71 | Ht 70.0 in | Wt 199.0 lb

## 2016-06-23 DIAGNOSIS — E1065 Type 1 diabetes mellitus with hyperglycemia: Secondary | ICD-10-CM

## 2016-06-23 DIAGNOSIS — E291 Testicular hypofunction: Secondary | ICD-10-CM

## 2016-06-23 NOTE — Patient Instructions (Signed)
Carb ratio 1:3  More testing  Restart Androgel
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
## 2016-06-24 ENCOUNTER — Other Ambulatory Visit: Payer: Self-pay | Admitting: Medical

## 2016-06-24 ENCOUNTER — Telehealth: Payer: Self-pay | Admitting: Endocrinology

## 2016-06-24 ENCOUNTER — Other Ambulatory Visit: Payer: Self-pay | Admitting: Family Medicine

## 2016-06-24 DIAGNOSIS — R7989 Other specified abnormal findings of blood chemistry: Secondary | ICD-10-CM

## 2016-06-24 DIAGNOSIS — E1040 Type 1 diabetes mellitus with diabetic neuropathy, unspecified: Secondary | ICD-10-CM

## 2016-06-24 DIAGNOSIS — IMO0002 Reserved for concepts with insufficient information to code with codable children: Secondary | ICD-10-CM

## 2016-06-24 DIAGNOSIS — E0842 Diabetes mellitus due to underlying condition with diabetic polyneuropathy: Secondary | ICD-10-CM

## 2016-06-24 DIAGNOSIS — R1013 Epigastric pain: Secondary | ICD-10-CM

## 2016-06-24 DIAGNOSIS — R630 Anorexia: Secondary | ICD-10-CM

## 2016-06-24 DIAGNOSIS — E1065 Type 1 diabetes mellitus with hyperglycemia: Principal | ICD-10-CM

## 2016-06-24 DIAGNOSIS — E785 Hyperlipidemia, unspecified: Secondary | ICD-10-CM

## 2016-06-24 DIAGNOSIS — I10 Essential (primary) hypertension: Secondary | ICD-10-CM

## 2016-06-24 DIAGNOSIS — E569 Vitamin deficiency, unspecified: Secondary | ICD-10-CM

## 2016-06-24 DIAGNOSIS — Z716 Tobacco abuse counseling: Secondary | ICD-10-CM

## 2016-06-24 MED ORDER — INSULIN ASPART 100 UNIT/ML ~~LOC~~ SOLN: SUBCUTANEOUS | 2 refills | Status: DC

## 2016-06-24 MED ORDER — BENAZEPRIL HCL 40 MG PO TABS: 40.0000 mg | ORAL_TABLET | Freq: Every day | ORAL | 0 refills | Status: DC

## 2016-06-24 MED ORDER — GNP ALCOHOL SWABS 70 % PADS: 1.0000 | MEDICATED_PAD | 11 refills | Status: AC | PRN

## 2016-06-24 MED ORDER — AMLODIPINE BESYLATE 10 MG PO TABS: 10.0000 mg | ORAL_TABLET | Freq: Every day | ORAL | 0 refills | Status: DC

## 2016-06-24 MED ORDER — GNP ALCOHOL SWABS 70 % PADS: 1.0000 | MEDICATED_PAD | 11 refills | Status: DC | PRN

## 2016-06-24 MED FILL — NovoLOG 100 UNIT/ML SOLN: 100 | 28 days supply | Qty: 40 | Fill #0 | Status: TO

## 2016-06-24 MED FILL — AMLODIPINE BESYLATE 10 MG T: 10 | 90 days supply | Qty: 90 | Fill #0

## 2016-06-24 MED FILL — SM ALCOHOL 70% PREP PADS: 70 | 90 days supply | Qty: 700 | Fill #0

## 2016-06-24 MED FILL — BENAZEPRIL HCL 40 MG TABLET: 40 | 90 days supply | Qty: 90 | Fill #0

## 2016-06-24 NOTE — Telephone Encounter (Signed)
Rx submitted

## 2016-06-24 NOTE — Telephone Encounter (Signed)
Patient need Novalog and alcohol pad sent to [facility].

## 2016-06-26 ENCOUNTER — Encounter: Payer: Self-pay | Admitting: Family Medicine

## 2016-06-27 ENCOUNTER — Other Ambulatory Visit: Payer: Self-pay | Admitting: Medical

## 2016-06-27 DIAGNOSIS — E1040 Type 1 diabetes mellitus with diabetic neuropathy, unspecified: Secondary | ICD-10-CM

## 2016-06-27 DIAGNOSIS — E1065 Type 1 diabetes mellitus with hyperglycemia: Principal | ICD-10-CM

## 2016-06-27 DIAGNOSIS — IMO0002 Reserved for concepts with insufficient information to code with codable children: Secondary | ICD-10-CM

## 2016-06-27 DIAGNOSIS — E0842 Diabetes mellitus due to underlying condition with diabetic polyneuropathy: Secondary | ICD-10-CM

## 2016-06-27 MED ORDER — OMEPRAZOLE 20 MG PO CPDR: 20.0000 mg | DELAYED_RELEASE_CAPSULE | Freq: Every day | ORAL | 6 refills | Status: DC

## 2016-06-27 MED FILL — OMEPRAZOLE DR 20 MG CAPSULE: 20 | 30 days supply | Qty: 30 | Fill #0

## 2016-06-27 NOTE — Addendum Note (Signed)
Addended by: Lamar Blinks C on: 06/27/2016 01:13 PM   Modules accepted: Orders

## 2016-06-29 ENCOUNTER — Other Ambulatory Visit: Payer: Self-pay | Admitting: Pharmacist

## 2016-06-29 ENCOUNTER — Emergency Department (HOSPITAL_BASED_OUTPATIENT_CLINIC_OR_DEPARTMENT_OTHER)
Admission: EM | Admit: 2016-06-29 | Discharge: 2016-06-29 | Disposition: A | Discharge status: Home / Self Care | Payer: 59 | Attending: Emergency Medicine | Admitting: Emergency Medicine

## 2016-06-29 ENCOUNTER — Encounter (HOSPITAL_BASED_OUTPATIENT_CLINIC_OR_DEPARTMENT_OTHER): Payer: Self-pay | Admitting: Emergency Medicine

## 2016-06-29 ENCOUNTER — Encounter: Payer: Self-pay | Admitting: Pharmacist

## 2016-06-29 DIAGNOSIS — Z794 Long term (current) use of insulin: Secondary | ICD-10-CM | POA: Insufficient documentation

## 2016-06-29 DIAGNOSIS — I10 Essential (primary) hypertension: Secondary | ICD-10-CM | POA: Insufficient documentation

## 2016-06-29 DIAGNOSIS — F17210 Nicotine dependence, cigarettes, uncomplicated: Secondary | ICD-10-CM | POA: Insufficient documentation

## 2016-06-29 DIAGNOSIS — E109 Type 1 diabetes mellitus without complications: Secondary | ICD-10-CM | POA: Diagnosis not present

## 2016-06-29 DIAGNOSIS — M25562 Pain in left knee: Secondary | ICD-10-CM | POA: Diagnosis present

## 2016-06-29 DIAGNOSIS — B9689 Other specified bacterial agents as the cause of diseases classified elsewhere: Secondary | ICD-10-CM

## 2016-06-29 DIAGNOSIS — L089 Local infection of the skin and subcutaneous tissue, unspecified: Secondary | ICD-10-CM | POA: Diagnosis not present

## 2016-06-29 MED ORDER — CLINDAMYCIN HCL 150 MG PO CAPS: 300.0000 mg | ORAL_CAPSULE | Freq: Three times a day (TID) | ORAL | 0 refills | Status: DC

## 2016-06-29 MED FILL — CONTOUR NEXT STRIPS: 31 days supply | Qty: 250 | Fill #1

## 2016-06-29 MED FILL — CLINDAMYCIN HCL 150 MG CAP: 150 | 5 days supply | Qty: 30 | Fill #0

## 2016-06-29 MED FILL — VIT D2 1.25 MG (50,000 UNIT: 1.25 MG | 55 days supply | Qty: 8 | Fill #1

## 2016-06-29 MED FILL — CIALIS 5 MG TABLET: 5 | 30 days supply | Qty: 30 | Fill #2
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
## 2016-06-29 NOTE — ED Notes (Addendum)
Pt states he scraped his left lower leg on a car approx 3 weeks-I month ago. Area will not heal. Reddened around edges and dark in the middle. Pain radiates up into knee. FSBS 175 this a.m.

## 2016-06-29 NOTE — Patient Outreach (Signed)
[facility]) Care Management  06/29/2016  [name] 14-Mar-1971 [id number]   Subjective:  Patient presents today for 3 month diabetes follow-up as part of the employer-sponsored Link to Wellness program.  Current diabetes regimen includes Novolog via pump.  Patient also continues on daily ARB and statin. Most recent endo followup was 6 days ago (06/23/16) and patient is to follow up with primary care per my recommendation today or tomorrow. No significant med or health changes at this time.   Assessment:  Diabetes: Most recent A1C was 8.4% which is exceeding goal of less than 7%. Weight is stable from last visit with me.   Lifestyle improvements:  Physical Activity- [name] continues to exercise fairly minimally; he walks a few days a week for about 20 minutes.  Nutrition- [name] continues on a similar diet as the past with a significant amount of carbohydrates consumed in the evening.  Pump management continues to be a significant topic of concern. His local site reaction to his sensor has led to him stopping using the sensor altogether, which significantly impedes his compliance. He continues to seek an efficient way to control his blood glucose which will require the least work possible. Dr. Dwyane Dee has recommended a change to the new 670 pump, which I wholly agree with. My (and Dr. Ronnie Derby) hope is that the new sensor and pump will allow for significantly better compliance due to better compliance with a new sensor and the new technology offered by the new pump. [name] showed me a small wound on his left leg which he says has persisted for weeks. He also has a handful of other small abrasions on arms/hands/etc. Which he states have persisted for weeks. I have recommended a trip to PCP to consider antibiotics.  Follow up with me in 3 months.    Plan/Goals for Next Visit:  1. Work on getting 670 approved. Contact Dr. Dwyane Dee and [employer] today. 2. Schedule an eye  exam 3. See [name] about your leg wound. 4. Increase rate of blood glucose checking 5. Call me back to schedule our next appointment in late September (when you have [name]'s schedule available)

## 2016-06-29 NOTE — ED Triage Notes (Signed)
Pt states he has a scratch on left lower leg that he thinks is infected and it's causing pain into his knee

## 2016-07-18 ENCOUNTER — Other Ambulatory Visit: Payer: Self-pay | Admitting: Family Medicine

## 2016-07-18 DIAGNOSIS — E1040 Type 1 diabetes mellitus with diabetic neuropathy, unspecified: Secondary | ICD-10-CM

## 2016-07-18 DIAGNOSIS — E1065 Type 1 diabetes mellitus with hyperglycemia: Principal | ICD-10-CM

## 2016-07-18 DIAGNOSIS — IMO0002 Reserved for concepts with insufficient information to code with codable children: Secondary | ICD-10-CM

## 2016-07-18 DIAGNOSIS — E0842 Diabetes mellitus due to underlying condition with diabetic polyneuropathy: Secondary | ICD-10-CM

## 2016-07-18 MED ORDER — TRAMADOL HCL 50 MG PO TABS: ORAL_TABLET | ORAL | 0 refills | Status: DC

## 2016-07-18 MED ORDER — DIAZEPAM 5 MG PO TABS: ORAL_TABLET | ORAL | 0 refills | Status: DC

## 2016-07-18 MED FILL — traMADol HCL 50 MG TABS: 50 | 30 days supply | Qty: 90 | Fill #0

## 2016-07-28 MED FILL — diazePAM 5 MG TABS: 5 | 30 days supply | Qty: 60 | Fill #0

## 2016-08-04 DIAGNOSIS — H5213 Myopia, bilateral: Secondary | ICD-10-CM | POA: Diagnosis not present

## 2016-08-04 DIAGNOSIS — E1036 Type 1 diabetes mellitus with diabetic cataract: Secondary | ICD-10-CM | POA: Diagnosis not present

## 2016-08-07 IMAGING — CR DG SHOULDER 2+V*R*
3 series · 3 of 3 positions shown · non-contrast
Comparison: None.

CLINICAL DATA: Fall down stairs today with injury and pain of
proximal humerus and shoulder. Initial encounter.

EXAM:
RIGHT SHOULDER - 2+ VIEW

[w shoulder ap internal righ *]
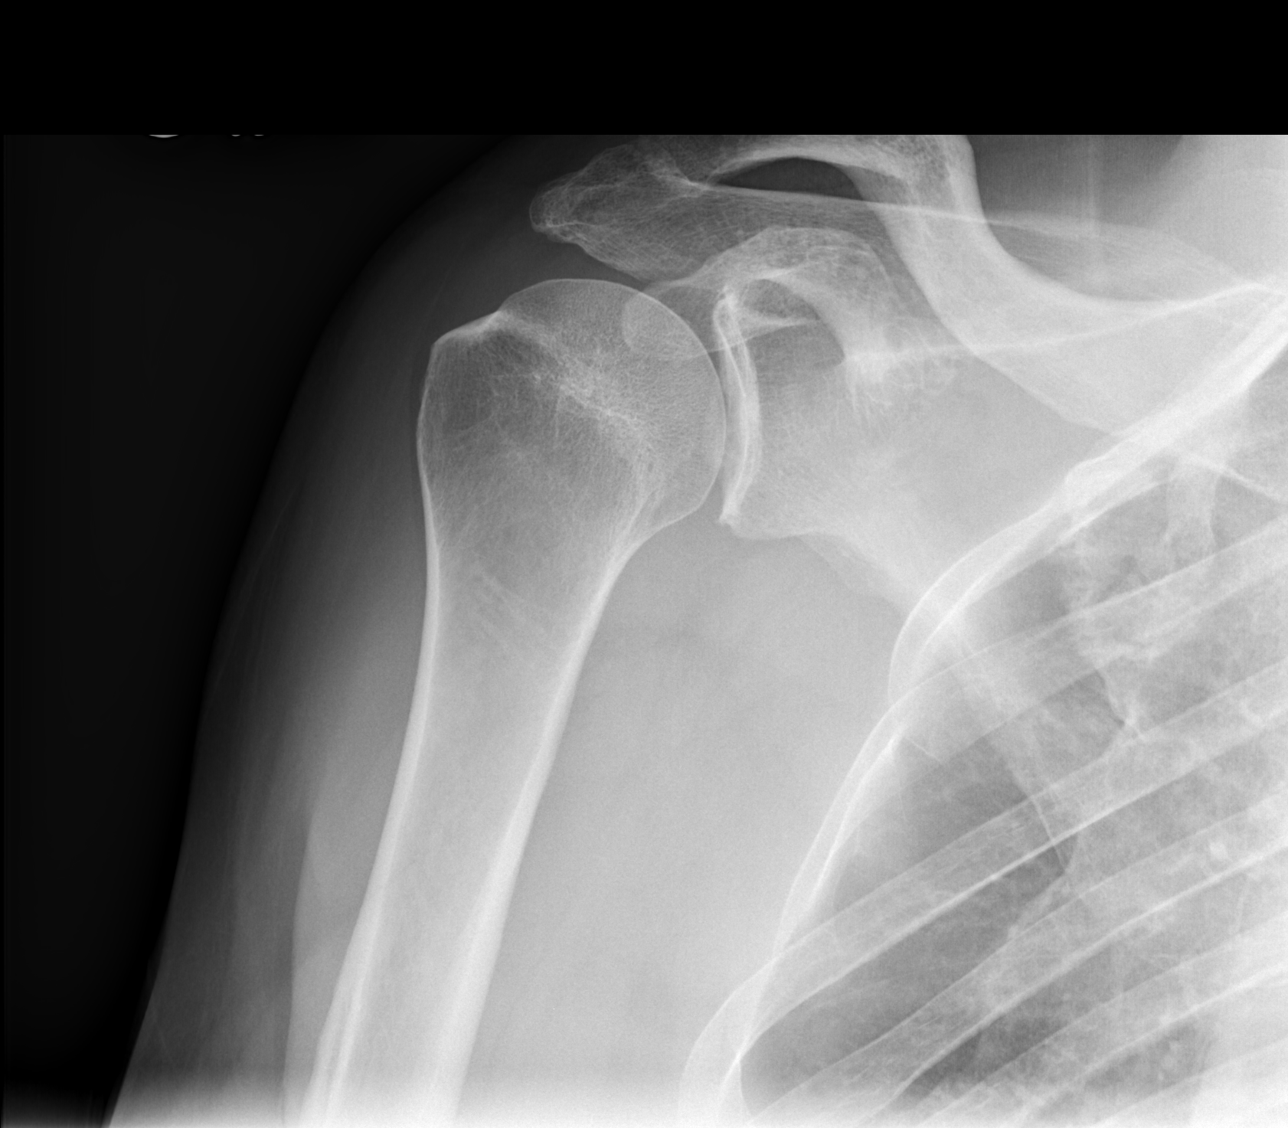

[w shoulder y view right]
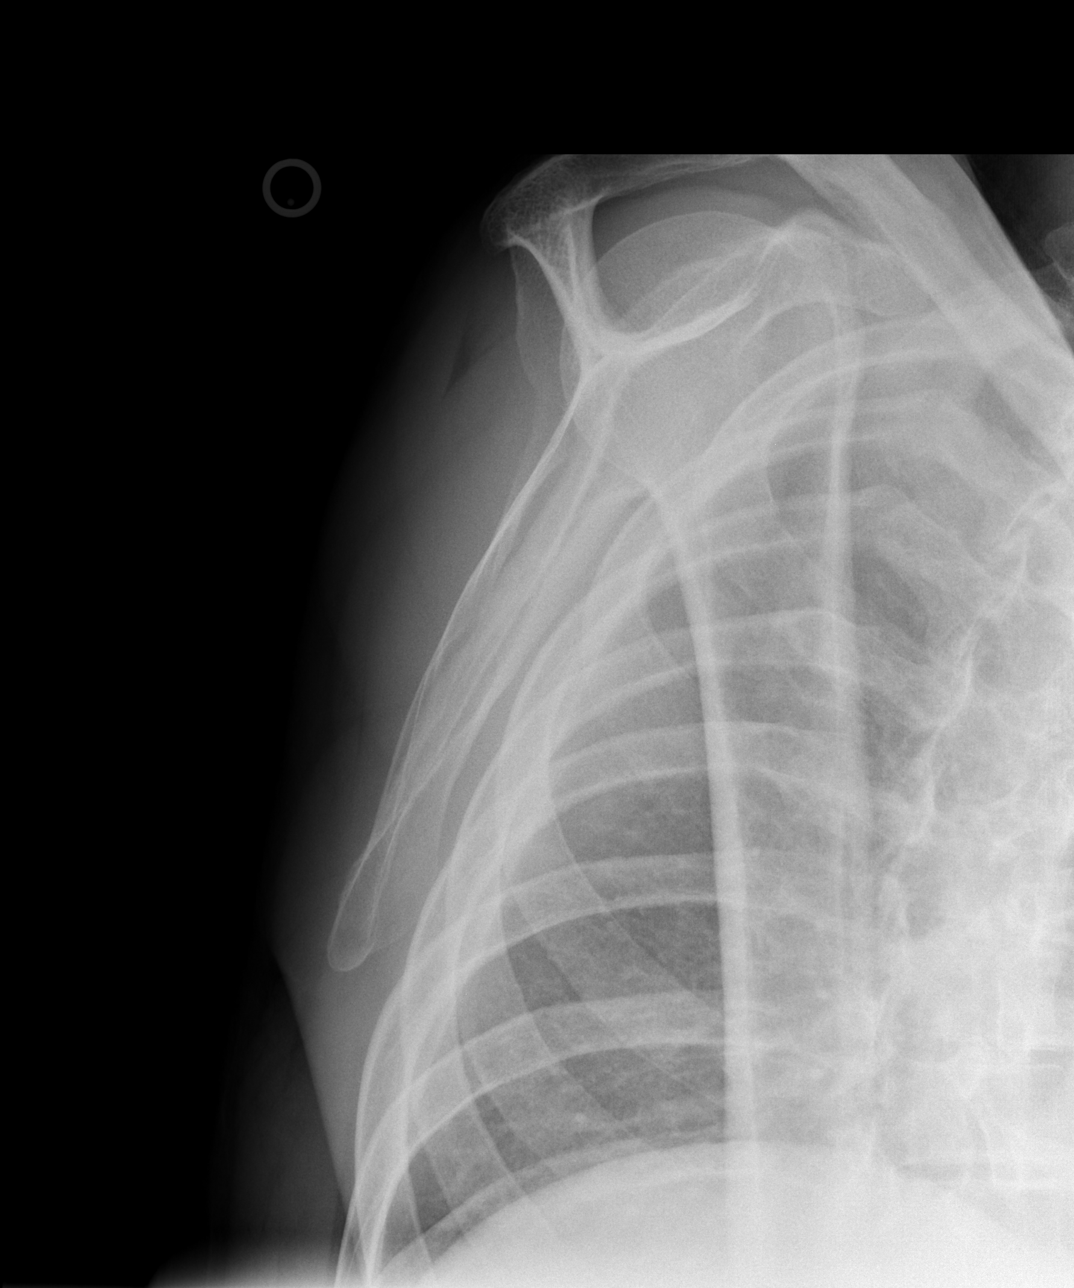

[x shoulder axillary right]
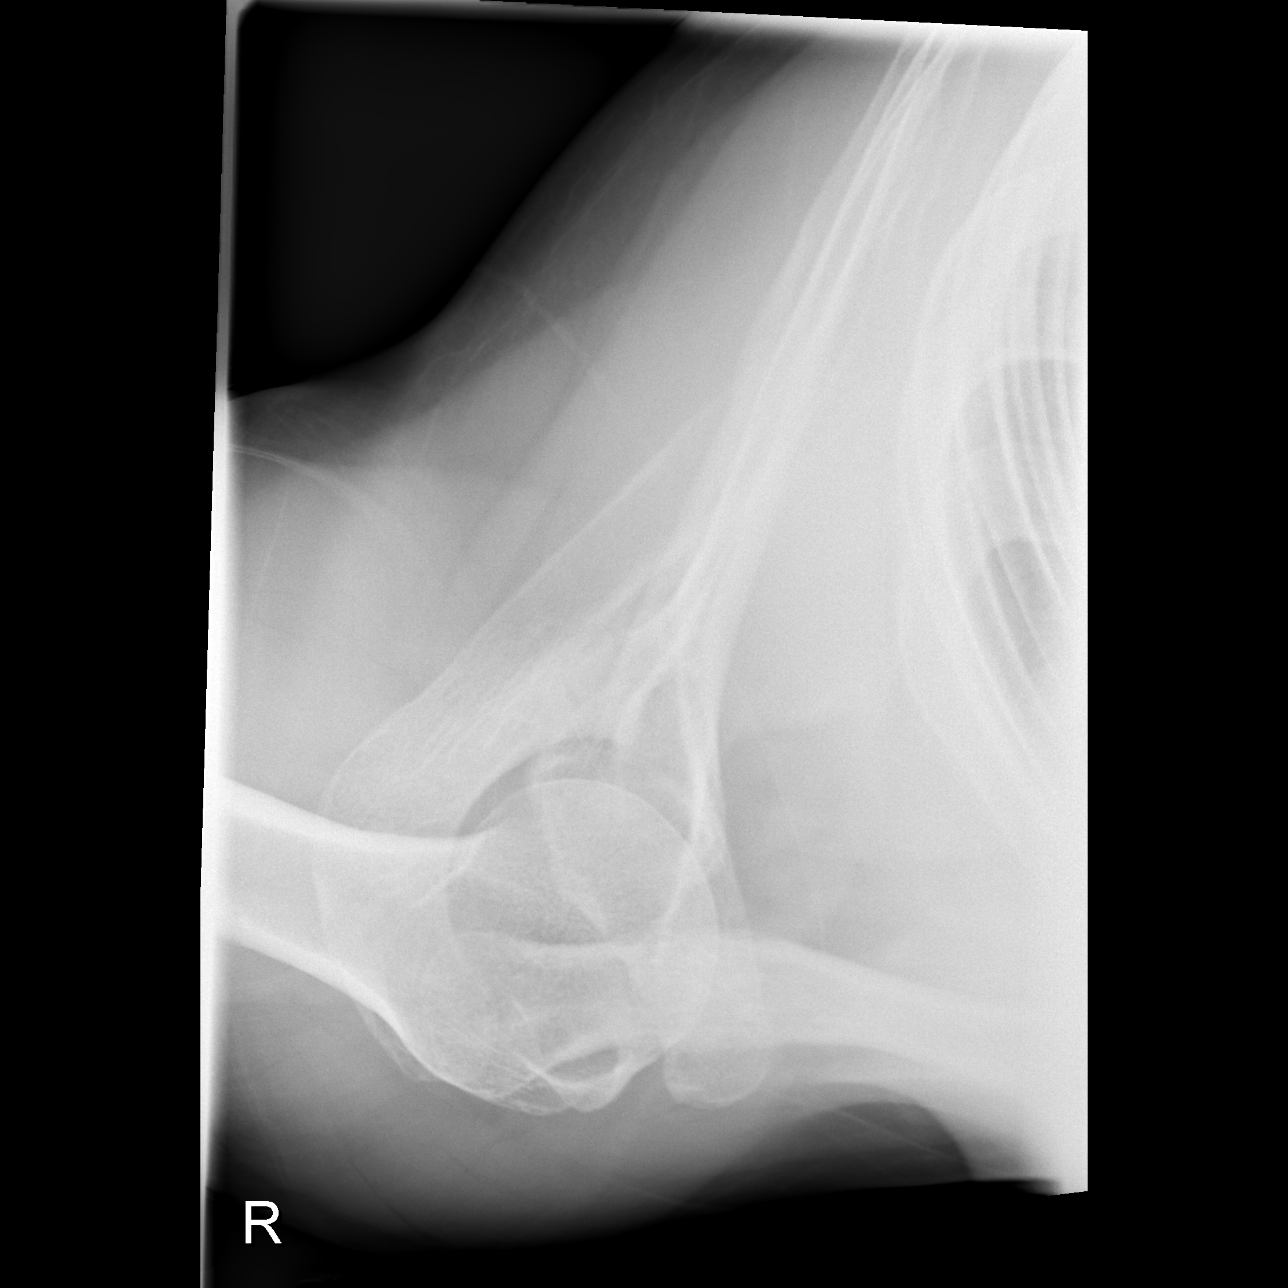

[3 of 3 positions shown; findings below may reference images not displayed]

FINDINGS: No acute fracture or dislocation is identified. Mild proliferative
changes of the acromion. The AC joint shows normal alignment. No
bony lesions. No soft tissue abnormalities.
IMPRESSION: No acute fracture identified.

## 2016-08-12 MED FILL — NovoLOG 100 UNIT/ML SOLN: 100 | 28 days supply | Qty: 40 | Fill #0

## 2016-08-12 MED FILL — METOPROLOL SUCC ER 50 MG TA: 50 | 90 days supply | Qty: 90 | Fill #0 | Status: TO

## 2016-08-27 ENCOUNTER — Other Ambulatory Visit: Payer: Self-pay | Admitting: Family Medicine

## 2016-08-27 DIAGNOSIS — IMO0002 Reserved for concepts with insufficient information to code with codable children: Secondary | ICD-10-CM

## 2016-08-27 DIAGNOSIS — E1065 Type 1 diabetes mellitus with hyperglycemia: Principal | ICD-10-CM

## 2016-08-27 DIAGNOSIS — E0842 Diabetes mellitus due to underlying condition with diabetic polyneuropathy: Secondary | ICD-10-CM

## 2016-08-27 DIAGNOSIS — E1040 Type 1 diabetes mellitus with diabetic neuropathy, unspecified: Secondary | ICD-10-CM

## 2016-08-29 ENCOUNTER — Other Ambulatory Visit: Payer: Self-pay | Admitting: Family Medicine

## 2016-08-29 DIAGNOSIS — IMO0002 Reserved for concepts with insufficient information to code with codable children: Secondary | ICD-10-CM

## 2016-08-29 DIAGNOSIS — E1065 Type 1 diabetes mellitus with hyperglycemia: Principal | ICD-10-CM

## 2016-08-29 DIAGNOSIS — E1040 Type 1 diabetes mellitus with diabetic neuropathy, unspecified: Secondary | ICD-10-CM

## 2016-08-29 DIAGNOSIS — E0842 Diabetes mellitus due to underlying condition with diabetic polyneuropathy: Secondary | ICD-10-CM

## 2016-08-29 MED ORDER — TRAMADOL HCL 50 MG PO TABS: ORAL_TABLET | ORAL | 0 refills | Status: DC

## 2016-08-29 MED ORDER — DIAZEPAM 5 MG PO TABS: ORAL_TABLET | ORAL | 0 refills | Status: DC

## 2016-08-29 MED FILL — diazePAM 5 MG TABS: 5 | 30 days supply | Qty: 60 | Fill #0

## 2016-08-29 MED FILL — traMADol HCL 50 MG TABS: 50 | 30 days supply | Qty: 90 | Fill #0

## 2016-08-29 NOTE — Telephone Encounter (Signed)
Hardcopys for both Diazepam and tramadol faxed to the [facility] pharmacy Patient aware through [facility].

## 2016-09-07 ENCOUNTER — Encounter: Payer: Self-pay | Admitting: Physician Assistant

## 2016-09-07 ENCOUNTER — Ambulatory Visit (INDEPENDENT_AMBULATORY_CARE_PROVIDER_SITE_OTHER): Payer: 59 | Admitting: Physician Assistant

## 2016-09-07 VITALS — BP 128/102 | HR 83 | Temp 97.6°F | Resp 16 | Ht 70.0 in | Wt 197.2 lb

## 2016-09-07 DIAGNOSIS — T2015XA Burn of first degree of scalp [any part], initial encounter: Secondary | ICD-10-CM

## 2016-09-07 DIAGNOSIS — Z23 Encounter for immunization: Secondary | ICD-10-CM

## 2016-09-07 DIAGNOSIS — T148XXD Other injury of unspecified body region, subsequent encounter: Secondary | ICD-10-CM

## 2016-09-07 LAB — CBC
HEMATOCRIT: 50.3 % (ref 39.0–52.0)
HEMOGLOBIN: 17.3 g/dL — AB (ref 13.0–17.0)
MCHC: 34.3 g/dL (ref 30.0–36.0)
MCV: 93.4 fl (ref 78.0–100.0)
PLATELETS: 221 10*3/uL (ref 150.0–400.0)
RBC: 5.38 Mil/uL (ref 4.22–5.81)
RDW: 13.4 % (ref 11.5–15.5)
WBC: 11.1 10*3/uL — ABNORMAL HIGH (ref 4.0–10.5)

## 2016-09-07 LAB — SEDIMENTATION RATE: Sed Rate: 2 mm/hr (ref 0–15)

## 2016-09-07 MED ORDER — SILVER SULFADIAZINE 1 % EX CREA: 1.0000 "application " | TOPICAL_CREAM | Freq: Every day | CUTANEOUS | 0 refills | Status: DC

## 2016-09-07 MED FILL — SSD 1% CREAM: 1 | 30 days supply | Qty: 50 | Fill #0
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
## 2016-09-07 NOTE — Patient Instructions (Addendum)
Please go to the lab for blood work. I will call with results.  You will be contacted for an appointment with Dermatology and Wound Care. Call to schedule a follow-up with your Endocrinology.  Use the Silvadene cream as directed. I want you to start a boost or glucerna shake to help give you nutrients. Your body will not heal without nutrition and you are not eating regularly. Work on this even if you are not hungry. Start a daily multivitamin.  Keep skin clean and dry. Wear supportive and protective footwear.

## 2016-09-08 LAB — IGG, IGA, IGM
IGA: 220 mg/dL (ref 81–463)
IGG (IMMUNOGLOBIN G), SERUM: 907 mg/dL (ref 694–1618)
IGM, SERUM: 36 mg/dL — AB (ref 48–271)

## 2016-09-12 ENCOUNTER — Encounter: Payer: Self-pay | Admitting: Family Medicine

## 2016-09-20 ENCOUNTER — Other Ambulatory Visit: Payer: Self-pay

## 2016-09-21 ENCOUNTER — Other Ambulatory Visit: Payer: Self-pay | Admitting: Family Medicine

## 2016-09-21 MED FILL — PANTOPRAZOLE SOD DR 40 MG T: 40 | 90 days supply | Qty: 90 | Fill #2

## 2016-09-21 MED FILL — DULoxetine HCL 60 MG CPEP: 60 | 90 days supply | Qty: 90 | Fill #1

## 2016-09-21 MED FILL — NovoLOG 100 UNIT/ML SOLN: 100 | 28 days supply | Qty: 40 | Fill #1

## 2016-09-21 MED FILL — ATORVASTATIN 80 MG TABLET: 80 | 90 days supply | Qty: 90 | Fill #0 | Status: TO

## 2016-09-22 ENCOUNTER — Encounter (HOSPITAL_BASED_OUTPATIENT_CLINIC_OR_DEPARTMENT_OTHER): Payer: 59

## 2016-09-23 ENCOUNTER — Ambulatory Visit: Payer: Self-pay | Admitting: Endocrinology

## 2016-09-27 ENCOUNTER — Encounter: Payer: Self-pay | Admitting: Family Medicine

## 2016-09-27 ENCOUNTER — Other Ambulatory Visit: Payer: Self-pay | Admitting: Family Medicine

## 2016-09-27 MED FILL — GABAPENTIN 300 MG CAPSULE: 300 | 30 days supply | Qty: 270 | Fill #0

## 2016-09-27 MED FILL — CIALIS 5 MG TABLET: 5 | 30 days supply | Qty: 30 | Fill #0

## 2016-09-27 NOTE — Telephone Encounter (Signed)
I have refilled requested meds but he needs an appt in next couple of months

## 2016-09-29 ENCOUNTER — Other Ambulatory Visit: Payer: Self-pay | Admitting: Family Medicine

## 2016-09-29 DIAGNOSIS — E1040 Type 1 diabetes mellitus with diabetic neuropathy, unspecified: Secondary | ICD-10-CM

## 2016-09-29 DIAGNOSIS — E0842 Diabetes mellitus due to underlying condition with diabetic polyneuropathy: Secondary | ICD-10-CM

## 2016-09-29 DIAGNOSIS — E1065 Type 1 diabetes mellitus with hyperglycemia: Principal | ICD-10-CM

## 2016-09-29 DIAGNOSIS — IMO0002 Reserved for concepts with insufficient information to code with codable children: Secondary | ICD-10-CM

## 2016-09-29 MED ORDER — TRAMADOL HCL 50 MG PO TABS: ORAL_TABLET | ORAL | 0 refills | Status: DC

## 2016-09-29 MED ORDER — DIAZEPAM 5 MG PO TABS: ORAL_TABLET | ORAL | 0 refills | Status: DC

## 2016-09-29 MED FILL — traMADol HCL 50 MG TABS: 50 | 30 days supply | Qty: 90 | Fill #0

## 2016-09-29 MED FILL — diazePAM 5 MG TABS: 5 | 30 days supply | Qty: 60 | Fill #0

## 2016-10-18 ENCOUNTER — Other Ambulatory Visit: Payer: Self-pay | Admitting: Family Medicine

## 2016-10-18 MED FILL — AMLODIPINE BESYLATE 10 MG T: 10 | 90 days supply | Qty: 90 | Fill #0

## 2016-10-18 MED FILL — BENAZEPRIL HCL 40 MG TABLET: 40 | 90 days supply | Qty: 90 | Fill #0

## 2016-10-18 NOTE — Telephone Encounter (Signed)
Called the patient informed of PCP instructions regarding appt.  He was in his car and will schedule appt. On mychart asap.

## 2016-10-18 NOTE — Telephone Encounter (Signed)
I have allowed one refill of both meds for 90 days supply but he has not been in office since June or seen me since May needs an appt for further refills before this runs out

## 2016-10-27 ENCOUNTER — Other Ambulatory Visit: Payer: Self-pay | Admitting: Family Medicine

## 2016-10-27 DIAGNOSIS — E1065 Type 1 diabetes mellitus with hyperglycemia: Principal | ICD-10-CM

## 2016-10-27 DIAGNOSIS — E1040 Type 1 diabetes mellitus with diabetic neuropathy, unspecified: Secondary | ICD-10-CM

## 2016-10-27 DIAGNOSIS — E0842 Diabetes mellitus due to underlying condition with diabetic polyneuropathy: Secondary | ICD-10-CM

## 2016-10-27 DIAGNOSIS — IMO0002 Reserved for concepts with insufficient information to code with codable children: Secondary | ICD-10-CM

## 2016-10-27 MED FILL — traMADol HCL 50 MG TABS: 50 | 30 days supply | Qty: 90 | Fill #0

## 2016-10-27 MED FILL — CONTOUR NEXT STRIPS: 32 days supply | Qty: 250 | Fill #0

## 2016-10-27 MED FILL — diazePAM 5 MG TABS: 5 | 30 days supply | Qty: 60 | Fill #0

## 2016-10-27 NOTE — Telephone Encounter (Signed)
Requesting:   Tramadol and Diazepam Contract    12/10/2014 UDS     Moderate Last OV     03/25/2016 Last Refill   Tramadol--#90 with 0 refills 09/29/2016                     Diazepam--#60 with 0 refills on 09/29/2016  Please Advise

## 2016-10-27 NOTE — Telephone Encounter (Signed)
Faxed hardcopy for Tramadol and Diazepam to [place]

## 2016-11-06 DIAGNOSIS — M545 Low back pain: Secondary | ICD-10-CM | POA: Diagnosis not present

## 2016-11-06 DIAGNOSIS — E1165 Type 2 diabetes mellitus with hyperglycemia: Secondary | ICD-10-CM | POA: Diagnosis not present

## 2016-11-06 DIAGNOSIS — Z9641 Presence of insulin pump (external) (internal): Secondary | ICD-10-CM | POA: Diagnosis not present

## 2016-11-06 DIAGNOSIS — E1365 Other specified diabetes mellitus with hyperglycemia: Secondary | ICD-10-CM | POA: Diagnosis not present

## 2016-11-06 DIAGNOSIS — F17210 Nicotine dependence, cigarettes, uncomplicated: Secondary | ICD-10-CM | POA: Diagnosis not present

## 2016-11-06 DIAGNOSIS — G8929 Other chronic pain: Secondary | ICD-10-CM | POA: Diagnosis not present

## 2016-11-06 DIAGNOSIS — R7309 Other abnormal glucose: Secondary | ICD-10-CM | POA: Diagnosis not present

## 2016-11-06 DIAGNOSIS — F1990 Other psychoactive substance use, unspecified, uncomplicated: Secondary | ICD-10-CM | POA: Diagnosis not present

## 2016-11-06 DIAGNOSIS — E860 Dehydration: Secondary | ICD-10-CM | POA: Diagnosis not present

## 2016-11-09 ENCOUNTER — Other Ambulatory Visit: Payer: Self-pay | Admitting: Endocrinology

## 2016-11-09 DIAGNOSIS — R1013 Epigastric pain: Secondary | ICD-10-CM

## 2016-11-09 DIAGNOSIS — I10 Essential (primary) hypertension: Secondary | ICD-10-CM

## 2016-11-09 DIAGNOSIS — Z716 Tobacco abuse counseling: Secondary | ICD-10-CM

## 2016-11-09 DIAGNOSIS — R7989 Other specified abnormal findings of blood chemistry: Secondary | ICD-10-CM

## 2016-11-09 DIAGNOSIS — E0842 Diabetes mellitus due to underlying condition with diabetic polyneuropathy: Secondary | ICD-10-CM

## 2016-11-09 DIAGNOSIS — E1065 Type 1 diabetes mellitus with hyperglycemia: Secondary | ICD-10-CM

## 2016-11-09 DIAGNOSIS — E785 Hyperlipidemia, unspecified: Secondary | ICD-10-CM

## 2016-11-09 DIAGNOSIS — E569 Vitamin deficiency, unspecified: Secondary | ICD-10-CM

## 2016-11-09 DIAGNOSIS — R630 Anorexia: Secondary | ICD-10-CM

## 2016-11-09 DIAGNOSIS — IMO0002 Reserved for concepts with insufficient information to code with codable children: Secondary | ICD-10-CM

## 2016-11-09 DIAGNOSIS — E1040 Type 1 diabetes mellitus with diabetic neuropathy, unspecified: Secondary | ICD-10-CM

## 2016-11-09 MED FILL — NovoLOG 100 UNIT/ML SOLN: 100 | 28 days supply | Qty: 40 | Fill #0

## 2016-11-09 NOTE — Telephone Encounter (Signed)
Patient need refill of  insulin aspart (NOVOLOG) 100 UNIT/ML injection  Patient is out of insulin, do we have samples??  [place], [place] - [address] [phone] (Phone) [phone] (Fax)

## 2016-11-18 ENCOUNTER — Ambulatory Visit: Payer: Self-pay | Admitting: Family Medicine

## 2016-12-19 ENCOUNTER — Other Ambulatory Visit: Payer: Self-pay | Admitting: Family Medicine

## 2016-12-19 ENCOUNTER — Telehealth: Payer: Self-pay | Admitting: Family Medicine

## 2016-12-19 DIAGNOSIS — IMO0002 Reserved for concepts with insufficient information to code with codable children: Secondary | ICD-10-CM

## 2016-12-19 DIAGNOSIS — E1040 Type 1 diabetes mellitus with diabetic neuropathy, unspecified: Secondary | ICD-10-CM

## 2016-12-19 DIAGNOSIS — E1065 Type 1 diabetes mellitus with hyperglycemia: Principal | ICD-10-CM

## 2016-12-19 DIAGNOSIS — E0842 Diabetes mellitus due to underlying condition with diabetic polyneuropathy: Secondary | ICD-10-CM

## 2016-12-19 MED FILL — METOPROLOL SUCC ER 50 MG TA: 50 | 90 days supply | Qty: 90 | Fill #0

## 2016-12-19 MED FILL — DULoxetine HCL 60 MG CPEP: 60 | 90 days supply | Qty: 90 | Fill #0

## 2016-12-19 MED FILL — OMEPRAZOLE DR 20 MG CAPSULE: 20 | 30 days supply | Qty: 30 | Fill #1

## 2016-12-19 NOTE — Telephone Encounter (Addendum)
Relation to [id number] Call back number:[phone]   Reason for call:  Patient requesting a refill traMADol (ULTRAM) 50 MG tablet and diazepam (VALIUM) 5 MG tablet, patient states 12/20/16 appointment cancelled due to provider, patient [facility] to 01/10/2017 and states will run out. Please advise

## 2016-12-20 ENCOUNTER — Telehealth: Payer: Self-pay | Admitting: Endocrinology

## 2016-12-20 ENCOUNTER — Ambulatory Visit: Payer: Self-pay | Admitting: Family Medicine

## 2016-12-20 ENCOUNTER — Telehealth: Payer: Self-pay | Admitting: Family Medicine

## 2016-12-20 DIAGNOSIS — R630 Anorexia: Secondary | ICD-10-CM

## 2016-12-20 DIAGNOSIS — E1040 Type 1 diabetes mellitus with diabetic neuropathy, unspecified: Secondary | ICD-10-CM

## 2016-12-20 DIAGNOSIS — R7989 Other specified abnormal findings of blood chemistry: Secondary | ICD-10-CM

## 2016-12-20 DIAGNOSIS — E569 Vitamin deficiency, unspecified: Secondary | ICD-10-CM

## 2016-12-20 DIAGNOSIS — E1065 Type 1 diabetes mellitus with hyperglycemia: Secondary | ICD-10-CM

## 2016-12-20 DIAGNOSIS — E0842 Diabetes mellitus due to underlying condition with diabetic polyneuropathy: Secondary | ICD-10-CM

## 2016-12-20 DIAGNOSIS — I10 Essential (primary) hypertension: Secondary | ICD-10-CM

## 2016-12-20 DIAGNOSIS — Z716 Tobacco abuse counseling: Secondary | ICD-10-CM

## 2016-12-20 DIAGNOSIS — E785 Hyperlipidemia, unspecified: Secondary | ICD-10-CM

## 2016-12-20 DIAGNOSIS — IMO0002 Reserved for concepts with insufficient information to code with codable children: Secondary | ICD-10-CM

## 2016-12-20 DIAGNOSIS — R1013 Epigastric pain: Secondary | ICD-10-CM

## 2016-12-20 MED ORDER — TRAMADOL HCL 50 MG PO TABS: 50.0000 mg | ORAL_TABLET | Freq: Three times a day (TID) | ORAL | 0 refills | Status: DC | PRN

## 2016-12-20 MED ORDER — DIAZEPAM 5 MG PO TABS: 5.0000 mg | ORAL_TABLET | Freq: Two times a day (BID) | ORAL | 0 refills | Status: DC

## 2016-12-20 MED ORDER — INSULIN LISPRO 100 UNIT/ML ~~LOC~~ SOLN: SUBCUTANEOUS | 0 refills | Status: DC

## 2016-12-20 MED FILL — HumaLOG 100 UNIT/ML SOLN: 100 | 29 days supply | Qty: 40 | Fill #0

## 2016-12-20 MED FILL — traMADol HCL 50 MG TABS: 50 | 30 days supply | Qty: 90 | Fill #0

## 2016-12-20 MED FILL — diazePAM 5 MG TABS: 5 | 30 days supply | Qty: 60 | Fill #0

## 2016-12-20 NOTE — Telephone Encounter (Signed)
Pt called in and said that he needs his Novolog switched to Humulog ASAP, he is about to run out and the Humulog is the preferred through his insurance.

## 2016-12-20 NOTE — Telephone Encounter (Signed)
OK to give refill on both meds but no further rx if he does not make his appt next month

## 2016-12-20 NOTE — Telephone Encounter (Signed)
Requesting:   Tramadol and Valium Contract    12/10/2014 UDS    Moderate--03/11/2015 Last OV   03/25/2016---NEXT SCHEDULED APPT   IS   01/10/2017 Last Refill   Tramadol   #90   10/27/2016                     Valium    #60    10/27/2016  Please Advise

## 2016-12-20 NOTE — Telephone Encounter (Signed)
Called the patient and insurance will no longer pay for novolog.  Needs to be changed to Humalog. Informed looks like Dr. Dwyane Dee fills, but patient stated PCP filled the last time and he has not been in to see Dr. Dwyane Dee due to transportation problems.  He would like to know if PCP would refill/also change to humalog?? [facility] pharmacy

## 2016-12-20 NOTE — Telephone Encounter (Signed)
Patient informed of PCP instructions regarding insulin. Sent in #30 day supply of humalog Patient had contacted his endo's office today as well.

## 2016-12-20 NOTE — Telephone Encounter (Signed)
Printed both and patient informed of PCP instructions. Faxed both to [employer]

## 2016-12-20 NOTE — Telephone Encounter (Signed)
I cannot manage his insulin since he is on a pump. He can have a one month supply of the Humalog same sig disp 40 ml

## 2016-12-20 NOTE — Telephone Encounter (Signed)
Relation to [id number] Call back [place]:  Reason for call:  Patient will run out of insulin Rx, requesting refill please send to   [place], [place] - [address] [phone] (Phone) [phone] (Fax)

## 2016-12-21 ENCOUNTER — Other Ambulatory Visit: Payer: Self-pay

## 2016-12-21 ENCOUNTER — Telehealth: Payer: Self-pay | Admitting: Endocrinology

## 2016-12-21 MED ORDER — INSULIN LISPRO 100 UNIT/ML ~~LOC~~ SOLN: SUBCUTANEOUS | 3 refills | Status: DC

## 2016-12-21 NOTE — Telephone Encounter (Signed)
ORDERED 12/21/16

## 2016-12-21 NOTE — Telephone Encounter (Signed)
[employer] told patient to call and let Dwyane Dee know he did not sign form to recieve diabetic testing supplies,  They are faxing paper back over.

## 2016-12-23 ENCOUNTER — Telehealth: Payer: Self-pay | Admitting: Family Medicine

## 2016-12-23 NOTE — Telephone Encounter (Signed)
Have received the fax and given to Dr. Dwyane Dee to sign

## 2016-12-23 NOTE — Telephone Encounter (Signed)
[place] pharmacy fax to inform Cialis has a $100 copay.  Please consider using Viagra to save the patient money.

## 2016-12-25 NOTE — Telephone Encounter (Signed)
Am happy to change him to Viagra if he agrees if so check with [facility] and see what dose pill is the cheapest and what number

## 2016-12-26 MED ORDER — SILDENAFIL CITRATE 50 MG PO TABS: ORAL_TABLET | ORAL | 2 refills | Status: DC

## 2016-12-26 NOTE — Telephone Encounter (Signed)
Sent in as instructed 

## 2016-12-26 NOTE — Telephone Encounter (Signed)
OK if Ok with patient then send in Sildenafil 50 mg tabs 1/2 to 2 tabs po daily prn ED, disp #20 with 2 rf and then he can play around with which dose works best for him

## 2016-12-26 NOTE — Telephone Encounter (Signed)
Called the pharmacy and they stated all the doses should be the same, since now it is generic .

## 2017-01-02 ENCOUNTER — Telehealth: Payer: Self-pay | Admitting: Family Medicine

## 2017-01-02 NOTE — Telephone Encounter (Signed)
Per pharmacy fax,  Insurance prefers humalog instead of novolog.

## 2017-01-05 MED FILL — VIAGRA 50 MG TABLET: 50 | 90 days supply | Qty: 18 | Fill #0 | Status: TO

## 2017-01-05 MED FILL — SM ALCOHOL 70% PREP PADS: 70 | 90 days supply | Qty: 700 | Fill #1

## 2017-01-05 MED FILL — ATORVASTATIN 80 MG TABLET: 80 | 90 days supply | Qty: 90 | Fill #0 | Status: TO

## 2017-01-06 ENCOUNTER — Other Ambulatory Visit: Payer: Self-pay

## 2017-01-06 MED ORDER — INSULIN LISPRO 100 UNIT/ML ~~LOC~~ SOLN: SUBCUTANEOUS | 3 refills | Status: DC

## 2017-01-06 NOTE — Telephone Encounter (Signed)
Ordered

## 2017-01-10 ENCOUNTER — Encounter: Payer: Self-pay | Admitting: Family Medicine

## 2017-01-10 ENCOUNTER — Ambulatory Visit (INDEPENDENT_AMBULATORY_CARE_PROVIDER_SITE_OTHER): Payer: 59 | Admitting: Family Medicine

## 2017-01-10 VITALS — BP 141/95 | HR 76 | Temp 97.9°F | Wt 202.8 lb

## 2017-01-10 DIAGNOSIS — I10 Essential (primary) hypertension: Secondary | ICD-10-CM | POA: Diagnosis not present

## 2017-01-10 DIAGNOSIS — E1040 Type 1 diabetes mellitus with diabetic neuropathy, unspecified: Secondary | ICD-10-CM

## 2017-01-10 DIAGNOSIS — E1065 Type 1 diabetes mellitus with hyperglycemia: Secondary | ICD-10-CM | POA: Diagnosis not present

## 2017-01-10 DIAGNOSIS — E349 Endocrine disorder, unspecified: Secondary | ICD-10-CM

## 2017-01-10 DIAGNOSIS — R7989 Other specified abnormal findings of blood chemistry: Secondary | ICD-10-CM

## 2017-01-10 DIAGNOSIS — E785 Hyperlipidemia, unspecified: Secondary | ICD-10-CM

## 2017-01-10 DIAGNOSIS — J40 Bronchitis, not specified as acute or chronic: Secondary | ICD-10-CM

## 2017-01-10 DIAGNOSIS — Z716 Tobacco abuse counseling: Secondary | ICD-10-CM

## 2017-01-10 DIAGNOSIS — IMO0002 Reserved for concepts with insufficient information to code with codable children: Secondary | ICD-10-CM

## 2017-01-10 MED ORDER — CEFDINIR 300 MG PO CAPS: 300.0000 mg | ORAL_CAPSULE | Freq: Two times a day (BID) | ORAL | 0 refills | Status: AC

## 2017-01-10 MED ORDER — ALBUTEROL SULFATE HFA 108 (90 BASE) MCG/ACT IN AERS: 2.0000 | INHALATION_SPRAY | Freq: Four times a day (QID) | RESPIRATORY_TRACT | 0 refills | Status: DC | PRN

## 2017-01-10 MED ORDER — HYDROCODONE-HOMATROPINE 5-1.5 MG/5ML PO SYRP: 5.0000 mL | ORAL_SOLUTION | Freq: Three times a day (TID) | ORAL | 0 refills | Status: DC | PRN

## 2017-01-10 NOTE — Assessment & Plan Note (Signed)
hgba1c unacceptable, minimize simple carbs. Increase exercise as tolerated. Continue current meds, check hgba1c with next blood

## 2017-01-10 NOTE — Patient Instructions (Signed)
Hypertension Hypertension is another name for high blood pressure. High blood pressure forces your heart to work harder to pump blood. A blood pressure reading has two numbers, which includes a higher number over a lower number (example: 110/72). Follow these instructions at home:  Have your blood pressure rechecked by your doctor.  Only take medicine as told by your doctor. Follow the directions carefully. The medicine does not work as well if you skip doses. Skipping doses also puts you at risk for problems.  Do not smoke.  Monitor your blood pressure at home as told by your doctor. Contact a doctor if:  You think you are having a reaction to the medicine you are taking.  You have repeat headaches or feel dizzy.  You have puffiness (swelling) in your ankles.  You have trouble with your vision. Get help right away if:  You get a very bad headache and are confused.  You feel weak, numb, or faint.  You get chest or belly (abdominal) pain.  You throw up (vomit).  You cannot breathe very well. This information is not intended to replace advice given to you by your health care provider. Make sure you discuss any questions you have with your health care provider. Document Released: 04/25/2008 Document Revised: 04/14/2016 Document Reviewed: 08/30/2013 Elsevier Interactive Patient Education  2017 [employer].  

## 2017-01-10 NOTE — Assessment & Plan Note (Signed)
Poorly controlled will alter medications, encouraged DASH diet, minimize caffeine and obtain adequate sleep. Report concerning symptoms and follow up as directed and as needed. Had sudafed today, try to avoid
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
## 2017-01-10 NOTE — Progress Notes (Signed)
Pre visit review using our clinic review tool, if applicable. No additional management support is needed unless otherwise documented below in the visit note. 

## 2017-01-12 ENCOUNTER — Encounter: Payer: Self-pay | Admitting: Family Medicine

## 2017-01-12 NOTE — Assessment & Plan Note (Signed)
Encouraged heart healthy diet, increase exercise, avoid trans fats, consider a krill oil cap daily 

## 2017-01-12 NOTE — Assessment & Plan Note (Signed)
Encouraged increased rest and hydration, add probiotics, zinc such as Coldeze or Xicam. Treat fevers as needed. Started on Mucinex, Cefdinir and probiotics

## 2017-01-19 NOTE — Telephone Encounter (Signed)
[employer] is calling on the status of diabetic supplys

## 2017-01-20 NOTE — Telephone Encounter (Signed)
Patient needs to be seen per Dr. Dwyane Dee

## 2017-01-23 ENCOUNTER — Other Ambulatory Visit: Payer: Self-pay | Admitting: Family Medicine

## 2017-01-23 DIAGNOSIS — E1065 Type 1 diabetes mellitus with hyperglycemia: Principal | ICD-10-CM

## 2017-01-23 DIAGNOSIS — IMO0002 Reserved for concepts with insufficient information to code with codable children: Secondary | ICD-10-CM

## 2017-01-23 DIAGNOSIS — E0842 Diabetes mellitus due to underlying condition with diabetic polyneuropathy: Secondary | ICD-10-CM

## 2017-01-23 DIAGNOSIS — E1040 Type 1 diabetes mellitus with diabetic neuropathy, unspecified: Secondary | ICD-10-CM

## 2017-01-23 NOTE — Telephone Encounter (Signed)
Received refill Rx for tramadol 50mg  (take 1 tab po TID prn for severe pain) and diazepam 5mg  (take 1 tab po BID).  Last Tf: 12/20/2016 (for both) Last Ov: 01/10/2017 Next Ov: 07/13/2017 UDS: 08/14/14  controlled substance contract signed, uds sample given, Moderate risk, next screen 11/13/14.  12/10/14 controlled substance contract signed, uds sample given, Moderate risk, next screen 03/11/15.  Forwarded to Provider for review, approval or denial.

## 2017-01-23 NOTE — Telephone Encounter (Signed)
Can have a  Refill on both meds but needs updated UDS and contract

## 2017-01-24 MED FILL — traMADol HCL 50 MG TABS: 50 | 30 days supply | Qty: 90 | Fill #0

## 2017-01-24 MED FILL — diazePAM 5 MG TABS: 5 | 30 days supply | Qty: 60 | Fill #0

## 2017-01-24 NOTE — Telephone Encounter (Signed)
Faxed hardcopy for Tramadol and diazepam to [facility].

## 2017-01-24 NOTE — Telephone Encounter (Signed)
Requesting:  Tramadol and diazepam Contract   12/10/2014 UDS   Moderated done on 03/11/2015 Last OV    01/10/2017--next scheduled appt. Is 07/13/2017 Last Refill    Diazepam  #60 no refills on 12/20/2016                      Tramadol  #90  No refills on 12/20/2016  Please Advise

## 2017-01-25 ENCOUNTER — Telehealth: Payer: Self-pay | Admitting: Endocrinology

## 2017-01-25 ENCOUNTER — Other Ambulatory Visit: Payer: Self-pay

## 2017-01-25 ENCOUNTER — Telehealth: Payer: Self-pay

## 2017-01-25 NOTE — Telephone Encounter (Signed)
Patient called and has scheduled an appointment and he is completely out of pump supplies and he stated that [facility] was told that he has not had a recent appointment and Dr. Dwyane Dee needs to sign the prescription for the supplies

## 2017-01-25 NOTE — Telephone Encounter (Signed)
Patient need diabetic supplys, he has appt

## 2017-01-25 NOTE — Telephone Encounter (Signed)
Where is the form from [employer]?

## 2017-01-26 NOTE — Telephone Encounter (Signed)
Form faxed 01/26/17

## 2017-01-27 DIAGNOSIS — E109 Type 1 diabetes mellitus without complications: Secondary | ICD-10-CM | POA: Diagnosis not present

## 2017-01-30 MED FILL — HumaLOG 100 UNIT/ML SOLN: 100 | 29 days supply | Qty: 40 | Fill #0 | Status: TO

## 2017-02-03 ENCOUNTER — Other Ambulatory Visit: Payer: Self-pay | Admitting: Family Medicine

## 2017-02-03 MED FILL — AMLODIPINE BESYLATE 10 MG T: 10 | 30 days supply | Qty: 30 | Fill #0 | Status: TO

## 2017-02-03 MED FILL — BENAZEPRIL HCL 40 MG TABLET: 40 | 30 days supply | Qty: 30 | Fill #0 | Status: TO

## 2017-02-09 ENCOUNTER — Ambulatory Visit: Payer: Self-pay | Admitting: Endocrinology

## 2017-02-20 ENCOUNTER — Telehealth: Payer: Self-pay | Admitting: Family Medicine

## 2017-02-20 DIAGNOSIS — E1065 Type 1 diabetes mellitus with hyperglycemia: Principal | ICD-10-CM

## 2017-02-20 DIAGNOSIS — E1040 Type 1 diabetes mellitus with diabetic neuropathy, unspecified: Secondary | ICD-10-CM

## 2017-02-20 DIAGNOSIS — E0842 Diabetes mellitus due to underlying condition with diabetic polyneuropathy: Secondary | ICD-10-CM

## 2017-02-20 DIAGNOSIS — IMO0002 Reserved for concepts with insufficient information to code with codable children: Secondary | ICD-10-CM

## 2017-02-20 MED ORDER — DIAZEPAM 5 MG PO TABS: 5.0000 mg | ORAL_TABLET | Freq: Two times a day (BID) | ORAL | 0 refills | Status: DC

## 2017-02-20 MED ORDER — TRAMADOL HCL 50 MG PO TABS: ORAL_TABLET | ORAL | 0 refills | Status: DC

## 2017-02-20 MED FILL — OMEPRAZOLE DR 20 MG CAPSULE: 20 | 30 days supply | Qty: 30 | Fill #2

## 2017-02-20 MED FILL — CONTOUR NEXT STRIPS: 30 days supply | Qty: 200 | Fill #1

## 2017-02-20 NOTE — Telephone Encounter (Signed)
Requesting:  Diazepam and Tramadol Contract  Signed on 12/10/14 UDS  Moderate was due on 03/11/15 Last OV    01/10/17-----future appt. 07/13/17 Last Refill   Diazepam  #60 no refills on 01/24/17                    Tramadol  #90 no refills on 01/24/17  Please Advise

## 2017-02-22 ENCOUNTER — Ambulatory Visit (INDEPENDENT_AMBULATORY_CARE_PROVIDER_SITE_OTHER): Payer: 59 | Admitting: Endocrinology

## 2017-02-22 ENCOUNTER — Encounter: Payer: Self-pay | Admitting: Endocrinology

## 2017-02-22 VITALS — BP 124/82 | HR 81 | Ht 70.0 in | Wt 201.0 lb

## 2017-02-22 DIAGNOSIS — E7800 Pure hypercholesterolemia, unspecified: Secondary | ICD-10-CM | POA: Diagnosis not present

## 2017-02-22 DIAGNOSIS — E291 Testicular hypofunction: Secondary | ICD-10-CM

## 2017-02-22 DIAGNOSIS — E1065 Type 1 diabetes mellitus with hyperglycemia: Secondary | ICD-10-CM

## 2017-02-22 DIAGNOSIS — IMO0002 Reserved for concepts with insufficient information to code with codable children: Secondary | ICD-10-CM

## 2017-02-22 DIAGNOSIS — E1040 Type 1 diabetes mellitus with diabetic neuropathy, unspecified: Secondary | ICD-10-CM | POA: Diagnosis not present

## 2017-02-22 LAB — CBC WITH DIFFERENTIAL/PLATELET
Basophils Absolute: 0.1 10*3/uL (ref 0.0–0.1)
Basophils Relative: 1.2 % (ref 0.0–3.0)
Eosinophils Absolute: 0.2 10*3/uL (ref 0.0–0.7)
Eosinophils Relative: 2.5 % (ref 0.0–5.0)
HCT: 40.3 % (ref 39.0–52.0)
HEMOGLOBIN: 14.2 g/dL (ref 13.0–17.0)
LYMPHS PCT: 28 % (ref 12.0–46.0)
Lymphs Abs: 1.9 10*3/uL (ref 0.7–4.0)
MCHC: 35.1 g/dL (ref 30.0–36.0)
MCV: 91.1 fl (ref 78.0–100.0)
MONOS PCT: 10.8 % (ref 3.0–12.0)
Monocytes Absolute: 0.7 10*3/uL (ref 0.1–1.0)
Neutro Abs: 3.9 10*3/uL (ref 1.4–7.7)
Neutrophils Relative %: 57.5 % (ref 43.0–77.0)
Platelets: 171 10*3/uL (ref 150.0–400.0)
RBC: 4.43 Mil/uL (ref 4.22–5.81)
RDW: 13.8 % (ref 11.5–15.5)
WBC: 6.8 10*3/uL (ref 4.0–10.5)

## 2017-02-22 LAB — COMPREHENSIVE METABOLIC PANEL
ALK PHOS: 83 U/L (ref 39–117)
ALT: 16 U/L (ref 0–53)
AST: 19 U/L (ref 0–37)
Albumin: 4.2 g/dL (ref 3.5–5.2)
BILIRUBIN TOTAL: 1.2 mg/dL (ref 0.2–1.2)
BUN: 14 mg/dL (ref 6–23)
CALCIUM: 9.3 mg/dL (ref 8.4–10.5)
CO2: 32 meq/L (ref 19–32)
Chloride: 102 mEq/L (ref 96–112)
Creatinine, Ser: 0.87 mg/dL (ref 0.40–1.50)
GFR: 100.3 mL/min (ref >60.00)
Glucose, Bld: 93 mg/dL (ref 70–99)
Potassium: 4 mEq/L (ref 3.5–5.1)
Sodium: 138 mEq/L (ref 135–145)
Total Protein: 6.8 g/dL (ref 6.0–8.3)

## 2017-02-22 LAB — LIPID PANEL
CHOLESTEROL: 84 mg/dL (ref 0–200)
HDL: 35.4 mg/dL — ABNORMAL LOW (ref >39.00)
LDL Cholesterol: 31 mg/dL (ref 0–99)
NonHDL: 48.24
Total CHOL/HDL Ratio: 2
Triglycerides: 84 mg/dL (ref 0.0–149.0)
VLDL: 16.8 mg/dL (ref 0.0–40.0)

## 2017-02-22 LAB — TSH: TSH: 1.66 u[IU]/mL (ref 0.35–4.50)

## 2017-02-22 LAB — TESTOSTERONE: Testosterone: 176.69 ng/dL — ABNORMAL LOW (ref 300.00–890.00)

## 2017-02-22 LAB — POCT GLYCOSYLATED HEMOGLOBIN (HGB A1C): Hemoglobin A1C: 7.8

## 2017-02-22 LAB — LUTEINIZING HORMONE: LH: 5.31 m[IU]/mL (ref 1.50–9.30)

## 2017-02-22 MED FILL — traMADol HCL 50 MG TABS: 50 | 30 days supply | Qty: 90 | Fill #0 | Status: TO

## 2017-02-22 MED FILL — diazePAM 5 MG TABS: 5 | 30 days supply | Qty: 60 | Fill #0
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
## 2017-02-22 NOTE — Patient Instructions (Signed)
Test sugar 4-6x daily including after meals and fasting

## 2017-03-13 MED FILL — AMLODIPINE BESYLATE 10 MG T: 10 | 60 days supply | Qty: 60 | Fill #0

## 2017-03-13 MED FILL — BENAZEPRIL HCL 40 MG TABLET: 40 | 90 days supply | Qty: 90 | Fill #0

## 2017-03-13 MED FILL — HumaLOG 100 UNIT/ML SOLN: 100 | 29 days supply | Qty: 40 | Fill #0

## 2017-03-28 MED FILL — OMEPRAZOLE DR 20 MG CAPSULE: 20 | 30 days supply | Qty: 30 | Fill #3

## 2017-03-28 NOTE — Progress Notes (Signed)
Please call to let patient know that the testosterone level is still very low, if he wants to use injection instead of skin preparations will need to send prescription for Depo testosterone, inject 100 mg weekly in the buttock area with a 10 mL multidose vial, he will also need 1 mL syringes with 25-gauge needle.  May need to do his first injection in the office preferably

## 2017-03-29 ENCOUNTER — Other Ambulatory Visit: Payer: Self-pay | Admitting: Emergency Medicine

## 2017-03-29 DIAGNOSIS — IMO0002 Reserved for concepts with insufficient information to code with codable children: Secondary | ICD-10-CM

## 2017-03-29 DIAGNOSIS — E1040 Type 1 diabetes mellitus with diabetic neuropathy, unspecified: Secondary | ICD-10-CM

## 2017-03-29 DIAGNOSIS — E1065 Type 1 diabetes mellitus with hyperglycemia: Principal | ICD-10-CM

## 2017-03-29 DIAGNOSIS — E0842 Diabetes mellitus due to underlying condition with diabetic polyneuropathy: Secondary | ICD-10-CM

## 2017-03-29 MED ORDER — DIAZEPAM 5 MG PO TABS: 5.0000 mg | ORAL_TABLET | Freq: Two times a day (BID) | ORAL | 0 refills | Status: DC

## 2017-03-29 MED FILL — diazePAM 5 MG TABS: 5 | 30 days supply | Qty: 60 | Fill #0

## 2017-03-29 NOTE — Telephone Encounter (Signed)
Reviewed N[facility]SR: last fill of diazepam 4/4, 3/6.    Dr. Charlett Blake is not in today so I will refill for pt Called into [facility]

## 2017-03-29 NOTE — Telephone Encounter (Deleted)
Requesting: diazepam (VALIUM) 5 MG tablet  Contract UDS Last OV:  Last Refill  Please Advise

## 2017-03-31 ENCOUNTER — Other Ambulatory Visit: Payer: Self-pay | Admitting: Emergency Medicine

## 2017-03-31 DIAGNOSIS — E1065 Type 1 diabetes mellitus with hyperglycemia: Principal | ICD-10-CM

## 2017-03-31 DIAGNOSIS — IMO0002 Reserved for concepts with insufficient information to code with codable children: Secondary | ICD-10-CM

## 2017-03-31 DIAGNOSIS — E0842 Diabetes mellitus due to underlying condition with diabetic polyneuropathy: Secondary | ICD-10-CM

## 2017-03-31 DIAGNOSIS — E1040 Type 1 diabetes mellitus with diabetic neuropathy, unspecified: Secondary | ICD-10-CM

## 2017-03-31 NOTE — Telephone Encounter (Signed)
Received refill request for traMADol (ULTRAM) 50 MG tablet. Last office visit 01/10/17 and last refill 02/20/17.  Please advise.

## 2017-04-02 MED ORDER — TRAMADOL HCL 50 MG PO TABS: ORAL_TABLET | ORAL | 0 refills | Status: DC

## 2017-04-03 MED FILL — traMADol HCL 50 MG TABS: 50 | 30 days supply | Qty: 90 | Fill #0

## 2017-04-03 NOTE — Telephone Encounter (Signed)
Faxed hardcopy to [facility]

## 2017-04-05 MED FILL — SILDENAFIL 50 MG TABLET: 50 | 90 days supply | Qty: 18 | Fill #0

## 2017-04-06 ENCOUNTER — Other Ambulatory Visit: Payer: Self-pay

## 2017-04-06 ENCOUNTER — Telehealth: Payer: Self-pay | Admitting: Endocrinology

## 2017-04-06 MED ORDER — TESTOSTERONE CYPIONATE 100 MG/ML IM SOLN: INTRAMUSCULAR | 4 refills | Status: DC

## 2017-04-06 MED ORDER — "INSULIN SYRINGE-NEEDLE U-100 25G X 1"" 1 ML MISC": 0 refills | Status: DC

## 2017-04-06 NOTE — Telephone Encounter (Signed)
Called patient to let him know that his testosterone and syringes with needles have been sent to the [facility]. I was not able to leave a message. Please advise if he calls back. Thank you!

## 2017-04-06 NOTE — Telephone Encounter (Signed)
Pt called in and said that he did receive our letter, he does want [place] to go ahead and send in the injections for the testosterone along with the 25 gauge syringes to the [facility] in [facility].

## 2017-04-08 ENCOUNTER — Emergency Department (HOSPITAL_BASED_OUTPATIENT_CLINIC_OR_DEPARTMENT_OTHER)
Admission: EM | Admit: 2017-04-08 | Discharge: 2017-04-08 | Disposition: A | Discharge status: Home / Self Care | Payer: 59 | Attending: Emergency Medicine | Admitting: Emergency Medicine

## 2017-04-08 ENCOUNTER — Encounter (HOSPITAL_BASED_OUTPATIENT_CLINIC_OR_DEPARTMENT_OTHER): Payer: Self-pay

## 2017-04-08 DIAGNOSIS — L98499 Non-pressure chronic ulcer of skin of other sites with unspecified severity: Secondary | ICD-10-CM | POA: Insufficient documentation

## 2017-04-08 DIAGNOSIS — T07XXXA Unspecified multiple injuries, initial encounter: Secondary | ICD-10-CM

## 2017-04-08 DIAGNOSIS — E11622 Type 2 diabetes mellitus with other skin ulcer: Secondary | ICD-10-CM | POA: Insufficient documentation

## 2017-04-08 DIAGNOSIS — F17210 Nicotine dependence, cigarettes, uncomplicated: Secondary | ICD-10-CM | POA: Insufficient documentation

## 2017-04-08 DIAGNOSIS — E119 Type 2 diabetes mellitus without complications: Secondary | ICD-10-CM | POA: Insufficient documentation

## 2017-04-08 DIAGNOSIS — I10 Essential (primary) hypertension: Secondary | ICD-10-CM | POA: Diagnosis not present

## 2017-04-08 DIAGNOSIS — Z8614 Personal history of Methicillin resistant Staphylococcus aureus infection: Secondary | ICD-10-CM | POA: Diagnosis not present

## 2017-04-08 DIAGNOSIS — S81802A Unspecified open wound, left lower leg, initial encounter: Secondary | ICD-10-CM | POA: Diagnosis not present

## 2017-04-08 DIAGNOSIS — Z794 Long term (current) use of insulin: Secondary | ICD-10-CM | POA: Insufficient documentation

## 2017-04-08 DIAGNOSIS — S71102A Unspecified open wound, left thigh, initial encounter: Secondary | ICD-10-CM | POA: Diagnosis not present

## 2017-04-08 DIAGNOSIS — S71101A Unspecified open wound, right thigh, initial encounter: Secondary | ICD-10-CM | POA: Diagnosis not present

## 2017-04-08 MED ORDER — SULFAMETHOXAZOLE-TRIMETHOPRIM 800-160 MG PO TABS: 1.0000 | ORAL_TABLET | Freq: Two times a day (BID) | ORAL | 0 refills | Status: AC

## 2017-04-08 MED ORDER — DICLOFENAC SODIUM 1 % TD GEL: TRANSDERMAL | 0 refills | Status: DC
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
## 2017-04-08 NOTE — ED Triage Notes (Signed)
Pt c/o multiple "diabetic ulcers" all over his body for the last year.  He came in tonight because he "couldn't take the pain" and it was waking him up.  Pt denies fevers, states he has "tried everything" for pain.  None of the wounds are open, no visible drainage, but there is a large, half dollar sized wound to outer right lower leg that is not healing.  Per chart, pt apparently does testosterone injections.  Most all of the wounds are in areas of injection sites.

## 2017-04-12 ENCOUNTER — Telehealth: Payer: Self-pay | Admitting: Endocrinology

## 2017-04-12 NOTE — Telephone Encounter (Signed)
Pharmacy trying to check on prior-authorization for patient's medication.

## 2017-04-19 ENCOUNTER — Telehealth: Payer: Self-pay

## 2017-04-19 DIAGNOSIS — E109 Type 1 diabetes mellitus without complications: Secondary | ICD-10-CM | POA: Diagnosis not present

## 2017-04-19 NOTE — Patient Outreach (Signed)
Called [facility] member to schedule a follow up appointment, there was no answer, left message to call me back. 

## 2017-04-20 ENCOUNTER — Other Ambulatory Visit: Payer: Self-pay | Admitting: Family Medicine

## 2017-04-20 MED FILL — METOPROLOL SUCC ER 50 MG TA: 50 | 90 days supply | Qty: 90 | Fill #0 | Status: TO

## 2017-04-20 MED FILL — DULoxetine HCL 60 MG CPEP: 60 | 90 days supply | Qty: 90 | Fill #1

## 2017-04-20 MED FILL — HumaLOG 100 UNIT/ML SOLN: 100 | 29 days supply | Qty: 40 | Fill #1

## 2017-04-20 MED FILL — ATORVASTATIN 80 MG TABLET: 80 | 90 days supply | Qty: 90 | Fill #0

## 2017-04-26 NOTE — Telephone Encounter (Signed)
Calling to check on PA for testosterone.  Thank you,  -LL

## 2017-05-01 MED FILL — OMEPRAZOLE DR 20 MG CAPSULE: 20 | 30 days supply | Qty: 30 | Fill #4

## 2017-05-01 NOTE — Telephone Encounter (Signed)
PA approved and patient has been notified

## 2017-05-02 ENCOUNTER — Telehealth: Payer: Self-pay | Admitting: Family Medicine

## 2017-05-02 ENCOUNTER — Encounter: Payer: Self-pay | Admitting: Family Medicine

## 2017-05-02 DIAGNOSIS — E0842 Diabetes mellitus due to underlying condition with diabetic polyneuropathy: Secondary | ICD-10-CM

## 2017-05-02 DIAGNOSIS — E1065 Type 1 diabetes mellitus with hyperglycemia: Principal | ICD-10-CM

## 2017-05-02 DIAGNOSIS — IMO0002 Reserved for concepts with insufficient information to code with codable children: Secondary | ICD-10-CM

## 2017-05-02 DIAGNOSIS — E1040 Type 1 diabetes mellitus with diabetic neuropathy, unspecified: Secondary | ICD-10-CM

## 2017-05-02 MED ORDER — DIAZEPAM 5 MG PO TABS: 5.0000 mg | ORAL_TABLET | Freq: Two times a day (BID) | ORAL | 0 refills | Status: DC

## 2017-05-02 MED ORDER — TRAMADOL HCL 50 MG PO TABS: ORAL_TABLET | ORAL | 0 refills | Status: DC

## 2017-05-02 MED FILL — diazePAM 5 MG TABS: 5 | 30 days supply | Qty: 60 | Fill #0

## 2017-05-02 MED FILL — traMADol HCL 50 MG TABS: 50 | 30 days supply | Qty: 90 | Fill #0

## 2017-05-02 NOTE — Telephone Encounter (Signed)
Printed and PCP signed. Called the patient informed to pickup hardcopy's of both tramadol/valiium at the front desk/complete uds and contract He did verbalize understanding and agreed to do.

## 2017-05-02 NOTE — Telephone Encounter (Signed)
Requesting:   Diazepam and tramadol Contract   12/10/14 UDS    Moderate last done on 07/26/16 Last OV    01/10/17 Last Refill     #60 on 03/29/17 (diazepam)                       Tramadol  #90 no refills on 04/02/2017  Please Advise----[facility] [phone]

## 2017-05-02 NOTE — Telephone Encounter (Signed)
He needs contract updated and UDS then can have refills

## 2017-05-04 ENCOUNTER — Encounter: Payer: Self-pay | Admitting: Family Medicine

## 2017-05-04 DIAGNOSIS — Z79899 Other long term (current) drug therapy: Secondary | ICD-10-CM | POA: Diagnosis not present

## 2017-05-04 DIAGNOSIS — Z79891 Long term (current) use of opiate analgesic: Secondary | ICD-10-CM | POA: Diagnosis not present

## 2017-05-05 MED FILL — TESTOSTERON CYP 1,000 MG/10: 100 | 30 days supply | Qty: 10 | Fill #0 | Status: TO

## 2017-05-05 NOTE — Telephone Encounter (Signed)
Called but was unable to leave vm will try again later- PA has been approved patient may need to contact his insurance company

## 2017-05-05 NOTE — Telephone Encounter (Signed)
Patient says PA approved but still having issues getting it filled. Insurance is kicking back at pharmacy and does not know why.  Thank you,  -LL

## 2017-05-09 ENCOUNTER — Telehealth: Payer: Self-pay | Admitting: Endocrinology

## 2017-05-09 ENCOUNTER — Telehealth: Payer: Self-pay

## 2017-05-09 NOTE — Telephone Encounter (Signed)
Per [place] testing failed Drug screen.  There was no Tramadol in his system and there was Morphine present.   Tramadol was prescribed to patient, Morphine has not been prescribed to patient.    Per Dr. Charlett Blake no more controlled substances are to be filled at this office.   I will call patient to notify.    PC

## 2017-05-09 NOTE — Telephone Encounter (Signed)
Patient called to advise [name] that he was able to get the medication that was needing the prior authorization. Patient forgot to let her know the day everything was trying to get cleared up. No further action required.

## 2017-05-09 NOTE — Telephone Encounter (Signed)
Left a vm requesting the patient call me back if there is still an issue getting his medication

## 2017-05-09 NOTE — Telephone Encounter (Signed)
Called patient left message for patient to call the office back.    PC

## 2017-05-30 ENCOUNTER — Ambulatory Visit: Payer: Self-pay

## 2017-06-02 ENCOUNTER — Other Ambulatory Visit: Payer: Self-pay | Admitting: Family Medicine

## 2017-06-02 MED FILL — HumaLOG 100 UNIT/ML SOLN: 100 | 29 days supply | Qty: 40 | Fill #2

## 2017-06-02 MED FILL — CONTOUR NEXT STRIPS: 8 days supply | Qty: 50 | Fill #2

## 2017-06-21 ENCOUNTER — Other Ambulatory Visit: Payer: Self-pay | Admitting: Family Medicine

## 2017-06-22 ENCOUNTER — Other Ambulatory Visit: Payer: Self-pay

## 2017-06-22 MED ORDER — GABAPENTIN 300 MG PO CAPS: ORAL_CAPSULE | ORAL | 0 refills | Status: DC

## 2017-06-27 MED FILL — AMLODIPINE BESYLATE 10 MG T: 10 | 60 days supply | Qty: 60 | Fill #1

## 2017-06-27 MED FILL — OMEPRAZOLE DR 20 MG CAPSULE: 20 | 30 days supply | Qty: 30 | Fill #5

## 2017-06-27 MED FILL — BENAZEPRIL HCL 40 MG TABLET: 40 | 90 days supply | Qty: 90 | Fill #1

## 2017-06-28 MED FILL — GABAPENTIN 300 MG CAPSULE: 300 | 30 days supply | Qty: 270 | Fill #0

## 2017-07-03 IMAGING — CT CT ANGIO CHEST
2 of 9 series · 17 of 46 positions shown · IV contrast (OMNI)
Comparison: Chest radiographs obtained earlier today.

CLINICAL DATA: Left chest pain since yesterday morning. Pain
radiates into the left arm, jaw and shoulder. Clinical concern for
aortic dissection.

EXAM:
CT ANGIOGRAPHY CHEST, ABDOMEN AND PELVIS
TECHNIQUE: Multidetector CT imaging through the chest, abdomen and pelvis was
performed using the standard protocol during bolus administration of
intravenous contrast. Multiplanar reconstructed images and MIPs were
obtained and reviewed to evaluate the vascular anatomy.
CONTRAST:  100mL OMNIPAQUE IOHEXOL 350 MG/ML SOLN

[Series 6: dissection 3.0 i30f 3 · axial · 0.87mm/px · z∈[+794,+1408]mm · 14 of 233 slices shown]
[im 14/233  lung]
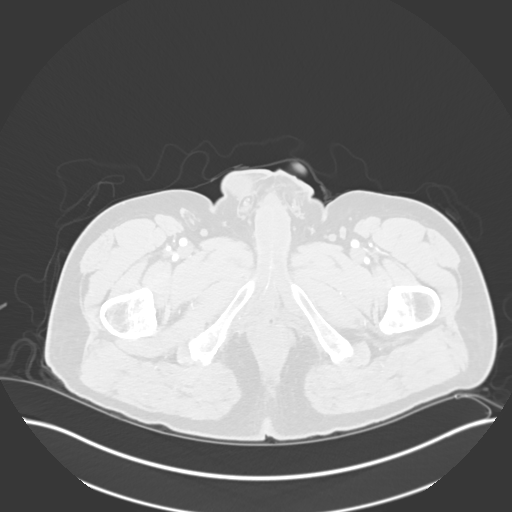
[im 28/233  soft-tissue]
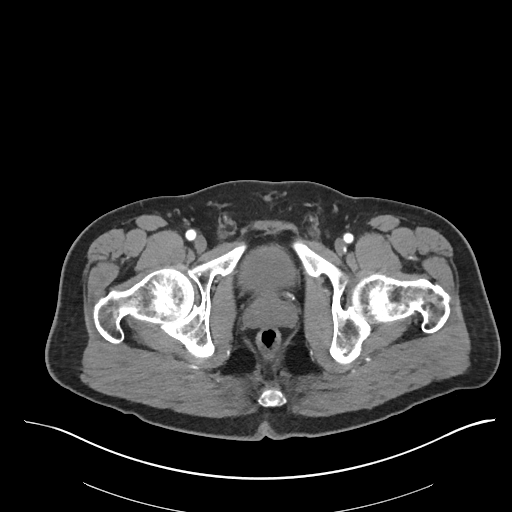
[im 41/233  lung]
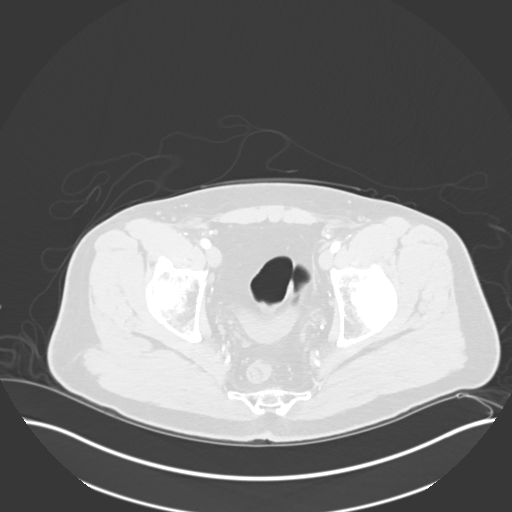
[im 69/233  soft-tissue]
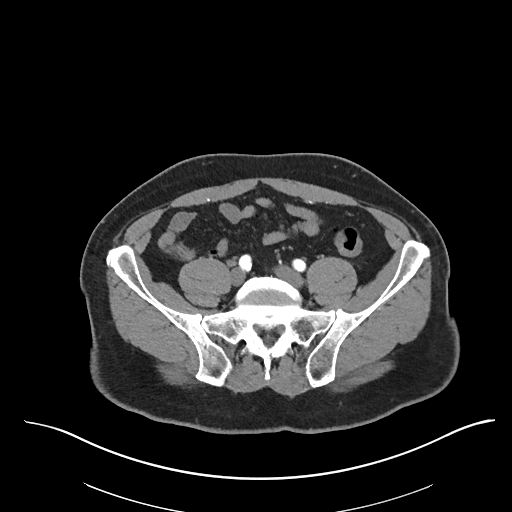
[im 82/233  lung]
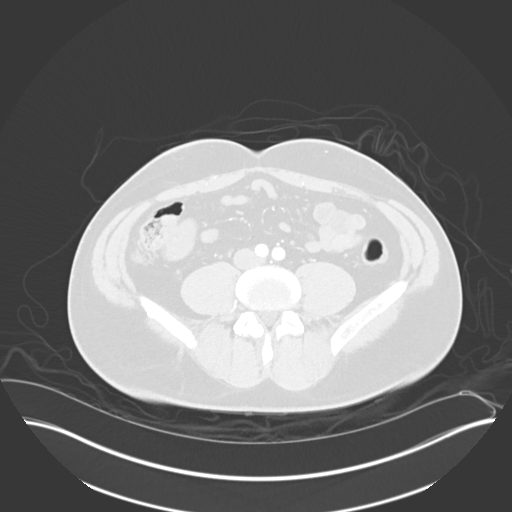
[im 96/233  soft-tissue]
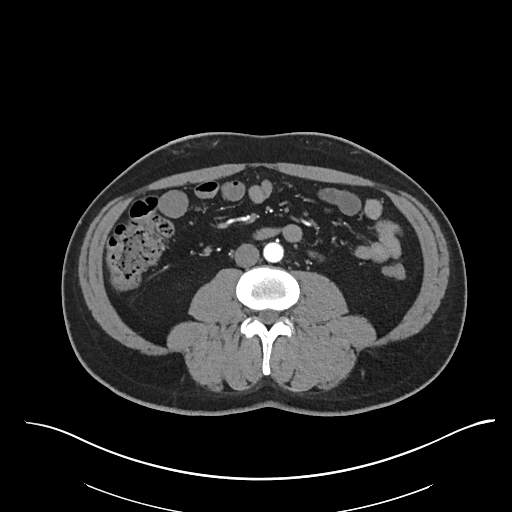
[im 110/233  lung]
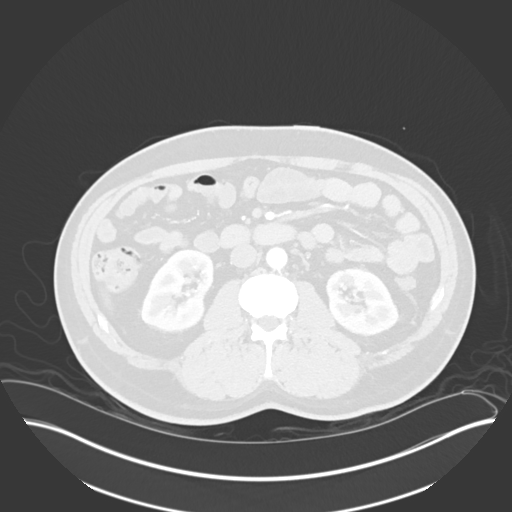
[im 123/233  soft-tissue]
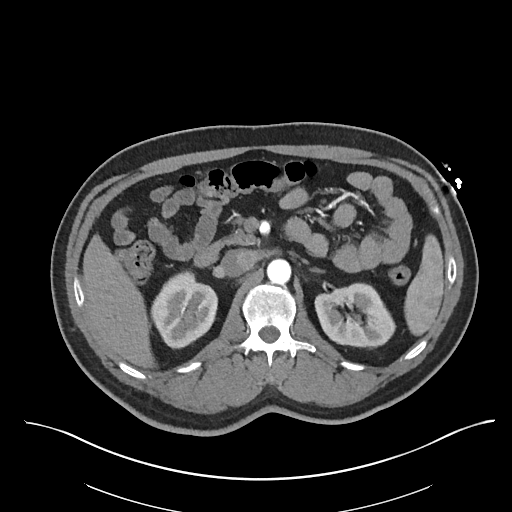
[im 137/233  lung]
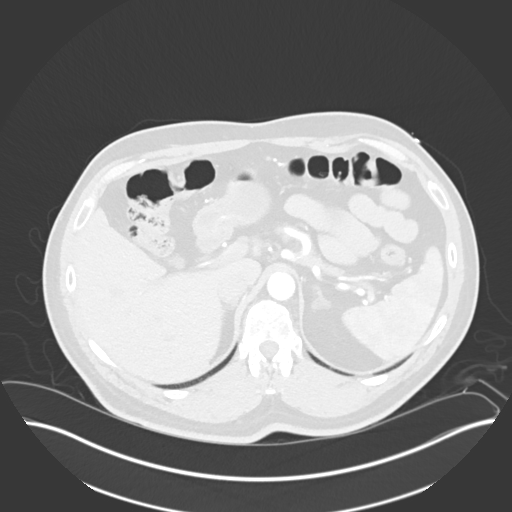
[im 151/233  soft-tissue]
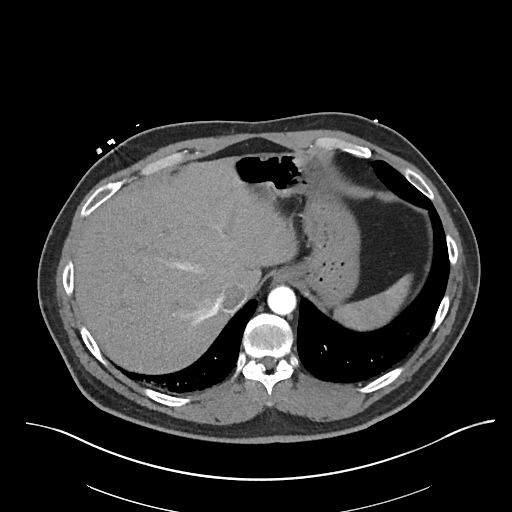
[im 178/233  lung]
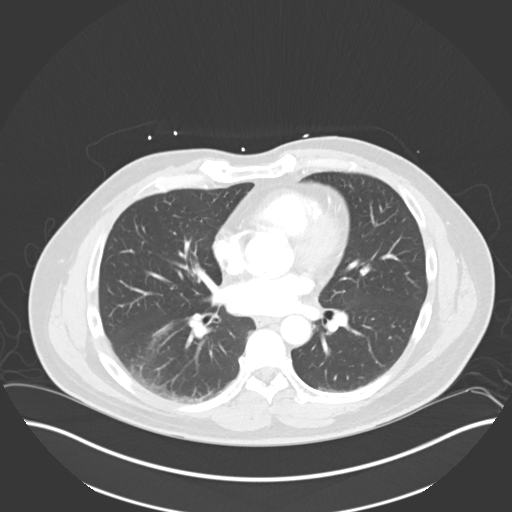
[im 192/233  soft-tissue]
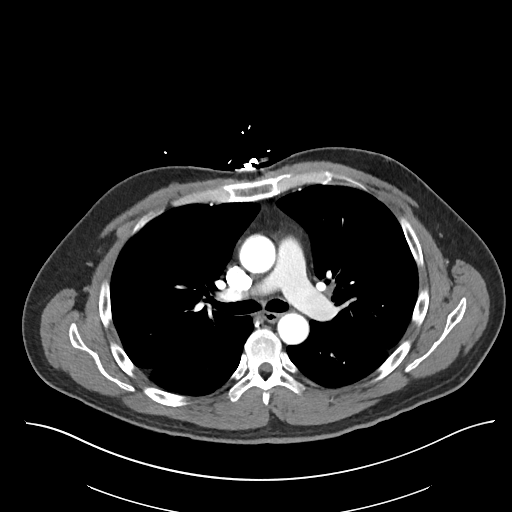
[im 205/233  lung]
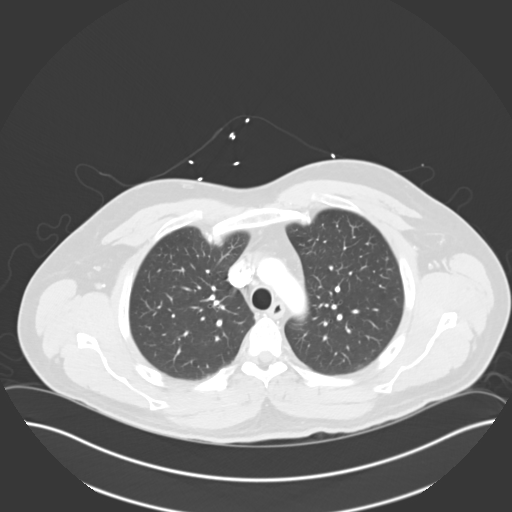
[im 219/233  soft-tissue]
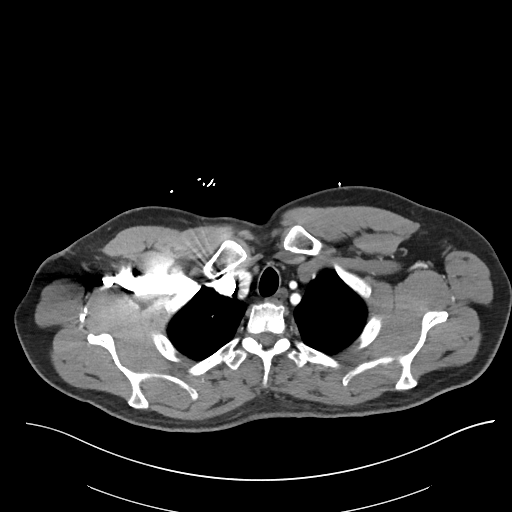

[Series 9: coronals · coronal · 0.82mm/px · 3 of 150 slices shown]
[im 38/150  soft-tissue]
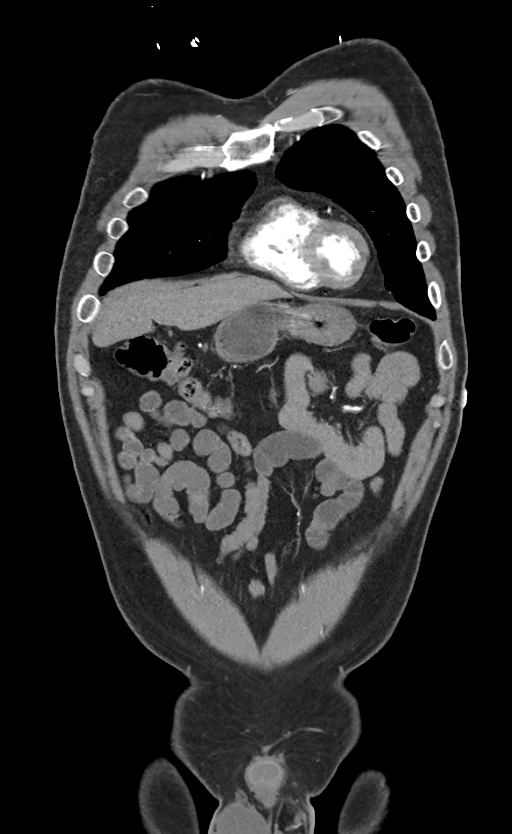
[im 75/150  soft-tissue]
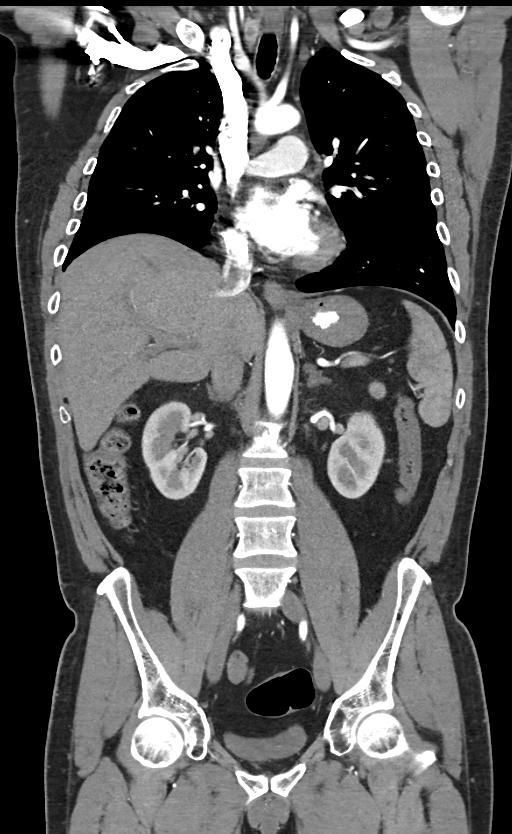
[im 112/150  soft-tissue]
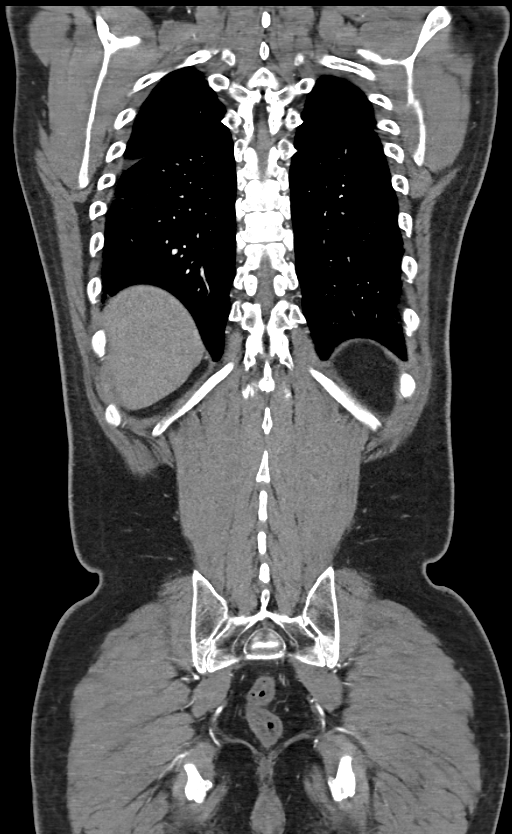

[17 of 46 positions shown; findings below may reference images not displayed]

FINDINGS: CTA CHEST FINDINGS

Proximal coronary artery calcifications. Normal appearing thoracic
aorta without aneurysm or dissection. The pulmonary arteries are
normally opacified with no pulmonary arterial filling defects seen.
Mild diffuse peribronchial thickening. Minimal dependent atelectasis
bilaterally. Small amount of airspace opacity in the superior aspect
of the right lower lobe. No lung nodules or enlarged lymph nodes. No
pleural fluid. 7 mm right lobe thyroid nodule. Additional tiny
nodules in the thyroid gland bilaterally. Thoracic spine
degenerative changes.

Review of the MIP images confirms the above findings.

CTA ABDOMEN AND PELVIS FINDINGS

Bilateral iliac artery atheromatous calcifications. Minimal aortic
calcification. The proximal celiac axis his tortuous with mild focal
narrowing. Otherwise, the arteries are normal in caliber. No
aneurysm or dissection. Normal appearing liver, spleen, gallbladder,
adrenal glands, kidneys, ureters, urinary bladder and prostate
gland. The pancreas is small. No gastrointestinal abnormalities or
enlarged lymph nodes. Normal appearing appendix. Lumbar spine
degenerative changes.

Review of the MIP images confirms the above findings.
IMPRESSION: 1. No aortic aneurysm or dissection.
2. No pulmonary emboli seen.
3. Diffuse pancreatic atrophy.
4. Normal sized, multinodular thyroid gland, none most likely benign
in the absence of known clinical risk factors for thyroid carcinoma.
5. Small amount of probable patchy atelectasis in the superior
aspect of the right lower lobe. Pneumonia is less likely.
6. Mild bronchitic changes.

## 2017-07-13 ENCOUNTER — Encounter: Payer: Self-pay | Admitting: Family Medicine

## 2017-07-13 MED FILL — HumaLOG 100 UNIT/ML SOLN: 100 | 28 days supply | Qty: 40 | Fill #0

## 2017-07-19 DIAGNOSIS — E109 Type 1 diabetes mellitus without complications: Secondary | ICD-10-CM | POA: Diagnosis not present

## 2017-08-11 MED FILL — CONTOUR NEXT STRIPS: 30 days supply | Qty: 200 | Fill #0

## 2017-08-14 ENCOUNTER — Other Ambulatory Visit: Payer: Self-pay | Admitting: Endocrinology

## 2017-08-14 MED FILL — SM ALCOHOL 70% PREP PADS: 70 | 85 days supply | Qty: 600 | Fill #0 | Status: TO

## 2017-08-16 ENCOUNTER — Encounter: Payer: Self-pay | Admitting: Family Medicine

## 2017-08-16 ENCOUNTER — Other Ambulatory Visit: Payer: Self-pay | Admitting: Family Medicine

## 2017-08-16 DIAGNOSIS — E1040 Type 1 diabetes mellitus with diabetic neuropathy, unspecified: Secondary | ICD-10-CM

## 2017-08-16 DIAGNOSIS — E1065 Type 1 diabetes mellitus with hyperglycemia: Principal | ICD-10-CM

## 2017-08-16 DIAGNOSIS — IMO0002 Reserved for concepts with insufficient information to code with codable children: Secondary | ICD-10-CM

## 2017-08-16 DIAGNOSIS — E0842 Diabetes mellitus due to underlying condition with diabetic polyneuropathy: Secondary | ICD-10-CM

## 2017-08-16 MED FILL — METOPROLOL SUCC ER 50 MG TA: 50 | 90 days supply | Qty: 90 | Fill #0

## 2017-08-16 MED FILL — TESTOSTERON CYP 1,000 MG/10: 100 | 70 days supply | Qty: 10 | Fill #0

## 2017-08-16 MED FILL — HumaLOG 100 UNIT/ML SOLN: 100 | 28 days supply | Qty: 40 | Fill #1 | Status: TO

## 2017-08-16 NOTE — Telephone Encounter (Signed)
At [place] [facility] advised to F/U 6 months/pt N/S on 8.23.18/asked pharm to advise pt to sched appt first/thx dmf

## 2017-08-18 MED FILL — OMEPRAZOLE 20 MG CAP: 20 | 30 days supply | Qty: 30 | Fill #0 | Status: TO

## 2017-08-24 ENCOUNTER — Ambulatory Visit: Payer: Self-pay | Admitting: Endocrinology

## 2017-09-08 ENCOUNTER — Other Ambulatory Visit: Payer: Self-pay | Admitting: Family Medicine

## 2017-09-22 MED FILL — DULoxetine HCL 60 MG CPEP: 60 | 90 days supply | Qty: 90 | Fill #0

## 2017-09-22 MED FILL — HumaLOG 100 UNIT/ML SOLN: 100 | 28 days supply | Qty: 40 | Fill #0 | Status: TO

## 2017-09-22 MED FILL — SILDENAFIL CITRATE 50 MG TA: 50 | 90 days supply | Qty: 18 | Fill #1 | Status: TO

## 2017-09-22 MED FILL — OMEPRAZOLE 20 MG CAP: 20 | 30 days supply | Qty: 30 | Fill #0

## 2017-09-26 ENCOUNTER — Ambulatory Visit: Payer: Self-pay | Admitting: Endocrinology

## 2017-09-26 DIAGNOSIS — Z0289 Encounter for other administrative examinations: Secondary | ICD-10-CM

## 2017-09-27 ENCOUNTER — Telehealth: Payer: Self-pay

## 2017-09-27 NOTE — Telephone Encounter (Signed)
Attempted to reach patient to get him scheduled for a f/u with Dr. Dwyane Dee- [employer] faxed some paperwork over to [place] but Dr. Dwyane Dee is requesting patient be scheduled for an appointment before the paperwork is filled out- patient did not have a working vm so I could not leave a message on his house or cell phone

## 2017-10-02 ENCOUNTER — Ambulatory Visit: Payer: Self-pay | Admitting: Pharmacist

## 2017-10-12 DIAGNOSIS — S8991XA Unspecified injury of right lower leg, initial encounter: Secondary | ICD-10-CM | POA: Diagnosis not present

## 2017-10-12 DIAGNOSIS — M25461 Effusion, right knee: Secondary | ICD-10-CM | POA: Diagnosis not present

## 2017-10-12 DIAGNOSIS — M25561 Pain in right knee: Secondary | ICD-10-CM | POA: Diagnosis not present

## 2017-10-12 DIAGNOSIS — W228XXA Striking against or struck by other objects, initial encounter: Secondary | ICD-10-CM | POA: Diagnosis not present

## 2017-10-12 DIAGNOSIS — Y999 Unspecified external cause status: Secondary | ICD-10-CM | POA: Diagnosis not present

## 2017-10-20 DIAGNOSIS — E109 Type 1 diabetes mellitus without complications: Secondary | ICD-10-CM | POA: Diagnosis not present

## 2017-10-26 DIAGNOSIS — F1520 Other stimulant dependence, uncomplicated: Secondary | ICD-10-CM | POA: Diagnosis not present

## 2017-10-27 DIAGNOSIS — F419 Anxiety disorder, unspecified: Secondary | ICD-10-CM | POA: Diagnosis not present

## 2017-10-27 DIAGNOSIS — M2550 Pain in unspecified joint: Secondary | ICD-10-CM | POA: Diagnosis not present

## 2017-10-27 DIAGNOSIS — F1520 Other stimulant dependence, uncomplicated: Secondary | ICD-10-CM | POA: Diagnosis not present

## 2017-10-27 DIAGNOSIS — F1120 Opioid dependence, uncomplicated: Secondary | ICD-10-CM | POA: Diagnosis not present

## 2017-10-28 DIAGNOSIS — F1520 Other stimulant dependence, uncomplicated: Secondary | ICD-10-CM | POA: Diagnosis not present

## 2017-10-28 DIAGNOSIS — F419 Anxiety disorder, unspecified: Secondary | ICD-10-CM | POA: Diagnosis not present

## 2017-10-28 DIAGNOSIS — F1120 Opioid dependence, uncomplicated: Secondary | ICD-10-CM | POA: Diagnosis not present

## 2017-10-28 DIAGNOSIS — M2550 Pain in unspecified joint: Secondary | ICD-10-CM | POA: Diagnosis not present

## 2017-10-29 DIAGNOSIS — F1520 Other stimulant dependence, uncomplicated: Secondary | ICD-10-CM | POA: Diagnosis not present

## 2017-10-29 DIAGNOSIS — F1120 Opioid dependence, uncomplicated: Secondary | ICD-10-CM | POA: Diagnosis not present

## 2017-10-29 DIAGNOSIS — F419 Anxiety disorder, unspecified: Secondary | ICD-10-CM | POA: Diagnosis not present

## 2017-10-29 DIAGNOSIS — M2550 Pain in unspecified joint: Secondary | ICD-10-CM | POA: Diagnosis not present

## 2017-10-30 DIAGNOSIS — M2550 Pain in unspecified joint: Secondary | ICD-10-CM | POA: Diagnosis not present

## 2017-10-30 DIAGNOSIS — F1520 Other stimulant dependence, uncomplicated: Secondary | ICD-10-CM | POA: Diagnosis not present

## 2017-10-30 DIAGNOSIS — F419 Anxiety disorder, unspecified: Secondary | ICD-10-CM | POA: Diagnosis not present

## 2017-10-30 DIAGNOSIS — F1120 Opioid dependence, uncomplicated: Secondary | ICD-10-CM | POA: Diagnosis not present

## 2017-10-31 ENCOUNTER — Ambulatory Visit: Payer: Self-pay | Admitting: Family Medicine

## 2017-10-31 DIAGNOSIS — F1120 Opioid dependence, uncomplicated: Secondary | ICD-10-CM | POA: Diagnosis not present

## 2017-10-31 DIAGNOSIS — F419 Anxiety disorder, unspecified: Secondary | ICD-10-CM | POA: Diagnosis not present

## 2017-10-31 DIAGNOSIS — F1520 Other stimulant dependence, uncomplicated: Secondary | ICD-10-CM | POA: Diagnosis not present

## 2017-10-31 DIAGNOSIS — M2550 Pain in unspecified joint: Secondary | ICD-10-CM | POA: Diagnosis not present

## 2017-11-01 DIAGNOSIS — F1120 Opioid dependence, uncomplicated: Secondary | ICD-10-CM | POA: Diagnosis not present

## 2017-11-01 DIAGNOSIS — F1520 Other stimulant dependence, uncomplicated: Secondary | ICD-10-CM | POA: Diagnosis not present

## 2017-11-01 DIAGNOSIS — M2550 Pain in unspecified joint: Secondary | ICD-10-CM | POA: Diagnosis not present

## 2017-11-01 DIAGNOSIS — F419 Anxiety disorder, unspecified: Secondary | ICD-10-CM | POA: Diagnosis not present

## 2017-11-02 ENCOUNTER — Telehealth: Payer: Self-pay

## 2017-11-02 NOTE — Telephone Encounter (Signed)
Called patient today to get f/u scheduled but phone was disconnected- if patient calls back he is over-due for f/u

## 2017-11-02 NOTE — Telephone Encounter (Signed)
PA for testosterone has been approved from 10/30/17 and is good as long as patient is enrolled in current health plan

## 2017-11-03 DIAGNOSIS — F1120 Opioid dependence, uncomplicated: Secondary | ICD-10-CM | POA: Diagnosis not present

## 2017-11-03 DIAGNOSIS — F1520 Other stimulant dependence, uncomplicated: Secondary | ICD-10-CM | POA: Diagnosis not present

## 2017-11-04 DIAGNOSIS — F1520 Other stimulant dependence, uncomplicated: Secondary | ICD-10-CM | POA: Diagnosis not present

## 2017-11-04 DIAGNOSIS — F1120 Opioid dependence, uncomplicated: Secondary | ICD-10-CM | POA: Diagnosis not present

## 2017-11-06 DIAGNOSIS — F1520 Other stimulant dependence, uncomplicated: Secondary | ICD-10-CM | POA: Diagnosis not present

## 2017-11-06 DIAGNOSIS — F1120 Opioid dependence, uncomplicated: Secondary | ICD-10-CM | POA: Diagnosis not present

## 2017-11-07 DIAGNOSIS — F1520 Other stimulant dependence, uncomplicated: Secondary | ICD-10-CM | POA: Diagnosis not present

## 2017-11-07 DIAGNOSIS — F1120 Opioid dependence, uncomplicated: Secondary | ICD-10-CM | POA: Diagnosis not present

## 2017-11-08 DIAGNOSIS — F1520 Other stimulant dependence, uncomplicated: Secondary | ICD-10-CM | POA: Diagnosis not present

## 2017-11-08 DIAGNOSIS — F1120 Opioid dependence, uncomplicated: Secondary | ICD-10-CM | POA: Diagnosis not present

## 2017-11-09 DIAGNOSIS — F1520 Other stimulant dependence, uncomplicated: Secondary | ICD-10-CM | POA: Diagnosis not present

## 2017-11-09 DIAGNOSIS — F1120 Opioid dependence, uncomplicated: Secondary | ICD-10-CM | POA: Diagnosis not present

## 2017-11-10 DIAGNOSIS — F1520 Other stimulant dependence, uncomplicated: Secondary | ICD-10-CM | POA: Diagnosis not present

## 2017-11-10 DIAGNOSIS — F1120 Opioid dependence, uncomplicated: Secondary | ICD-10-CM | POA: Diagnosis not present

## 2017-11-11 DIAGNOSIS — F1120 Opioid dependence, uncomplicated: Secondary | ICD-10-CM | POA: Diagnosis not present

## 2017-11-11 DIAGNOSIS — F1520 Other stimulant dependence, uncomplicated: Secondary | ICD-10-CM | POA: Diagnosis not present

## 2017-11-13 DIAGNOSIS — F1520 Other stimulant dependence, uncomplicated: Secondary | ICD-10-CM | POA: Diagnosis not present

## 2017-11-13 DIAGNOSIS — F1120 Opioid dependence, uncomplicated: Secondary | ICD-10-CM | POA: Diagnosis not present

## 2017-11-15 DIAGNOSIS — F1120 Opioid dependence, uncomplicated: Secondary | ICD-10-CM | POA: Diagnosis not present

## 2017-11-15 DIAGNOSIS — F1520 Other stimulant dependence, uncomplicated: Secondary | ICD-10-CM | POA: Diagnosis not present

## 2017-11-16 DIAGNOSIS — F1520 Other stimulant dependence, uncomplicated: Secondary | ICD-10-CM | POA: Diagnosis not present

## 2017-11-16 DIAGNOSIS — F1120 Opioid dependence, uncomplicated: Secondary | ICD-10-CM | POA: Diagnosis not present

## 2017-11-17 DIAGNOSIS — F1120 Opioid dependence, uncomplicated: Secondary | ICD-10-CM | POA: Diagnosis not present

## 2017-11-17 DIAGNOSIS — F1520 Other stimulant dependence, uncomplicated: Secondary | ICD-10-CM | POA: Diagnosis not present

## 2017-11-18 DIAGNOSIS — F1520 Other stimulant dependence, uncomplicated: Secondary | ICD-10-CM | POA: Diagnosis not present

## 2017-11-18 DIAGNOSIS — F1120 Opioid dependence, uncomplicated: Secondary | ICD-10-CM | POA: Diagnosis not present

## 2017-11-20 DIAGNOSIS — F1120 Opioid dependence, uncomplicated: Secondary | ICD-10-CM | POA: Diagnosis not present

## 2017-11-20 DIAGNOSIS — F1520 Other stimulant dependence, uncomplicated: Secondary | ICD-10-CM | POA: Diagnosis not present

## 2017-11-30 ENCOUNTER — Other Ambulatory Visit: Payer: Self-pay | Admitting: Family Medicine

## 2017-11-30 MED FILL — HumaLOG 100 UNIT/ML SOLN: 100 | 28 days supply | Qty: 40 | Fill #0 | Status: TO

## 2017-11-30 MED FILL — METOPROLOL SUCC ER 50 MG TA: 50 | 90 days supply | Qty: 90 | Fill #0

## 2017-11-30 MED FILL — CONTOUR NEXT STRIPS: 15 days supply | Qty: 100 | Fill #1 | Status: TO

## 2017-11-30 MED FILL — SILDENAFIL CITRATE 50 MG TA: 50 | 30 days supply | Qty: 6 | Fill #0

## 2017-12-01 ENCOUNTER — Inpatient Hospital Stay: Payer: Self-pay | Admitting: Medical

## 2017-12-01 DIAGNOSIS — Z0289 Encounter for other administrative examinations: Secondary | ICD-10-CM

## 2017-12-06 MED FILL — SUBOXONE 8 MG-2 MG SL FILM: 8-2 | 6 days supply | Qty: 10 | Fill #0

## 2017-12-13 MED FILL — BENAZEPRIL HCL 40 MG TABLET: 40 | 60 days supply | Qty: 60 | Fill #0

## 2017-12-13 MED FILL — AMLODIPINE BESYLATE 10 MG T: 10 | 60 days supply | Qty: 60 | Fill #0

## 2017-12-13 MED FILL — OMEPRAZOLE 20 MG CAP: 20 | 60 days supply | Qty: 60 | Fill #0

## 2017-12-13 MED FILL — SUBOXONE 8 MG-2 MG SL FILM: 8-2 | 7 days supply | Qty: 11 | Fill #0

## 2017-12-20 MED FILL — BUPRENORPHINE HCL-NALOXONE: 8-2 | 8 days supply | Qty: 13 | Fill #0

## 2017-12-29 MED FILL — SUBOXONE 8 MG-2 MG SL FILM: 8-2 | 10 days supply | Qty: 15 | Fill #0

## 2018-01-08 MED FILL — SUBOXONE 8 MG-2 MG SL FILM: 8-2 | 14 days supply | Qty: 21 | Fill #0

## 2018-01-15 MED FILL — HumaLOG 100 UNIT/ML SOLN: 100 | 29 days supply | Qty: 40 | Fill #0

## 2018-01-15 MED FILL — DULoxetine HCL 60 MG CPEP: 60 | 90 days supply | Qty: 90 | Fill #1

## 2018-01-22 MED FILL — SUBOXONE 8 MG-2 MG SL FILM: 8-2 | 16 days supply | Qty: 24 | Fill #0

## 2018-02-07 MED FILL — SUBOXONE 8 MG-2 MG SL FILM: 8-2 | 30 days supply | Qty: 45 | Fill #0

## 2018-02-22 ENCOUNTER — Other Ambulatory Visit: Payer: Self-pay | Admitting: Family Medicine

## 2018-02-22 ENCOUNTER — Other Ambulatory Visit: Payer: Self-pay | Admitting: Endocrinology

## 2018-02-22 MED FILL — BENAZEPRIL HCL 40 MG TABLET: 40 | 90 days supply | Qty: 90 | Fill #0

## 2018-02-22 MED FILL — CONTOUR NEXT STRIPS: 15 days supply | Qty: 100 | Fill #0

## 2018-02-22 MED FILL — BD 3 ML SYRINGE 25GX1": 25G X 1" | 84 days supply | Qty: 12 | Fill #0

## 2018-02-22 MED FILL — ULTICARE ALCOHOL SWABS PADS: 85 days supply | Qty: 600 | Fill #0 | Status: TO

## 2018-02-22 MED FILL — OMEPRAZOLE 20 MG CAP: 20 | 30 days supply | Qty: 30 | Fill #1

## 2018-02-22 MED FILL — BD 3 ML SYRINGE 25GX1: 25G X 1" | 84 days supply | Qty: 12 | Fill #0

## 2018-02-22 MED FILL — HumaLOG 100 UNIT/ML SOLN: 100 | 28 days supply | Qty: 40 | Fill #0

## 2018-03-07 MED FILL — BUPRENORPHINE HCL-NALOXONE: 8-2 | 18 days supply | Qty: 27 | Fill #1

## 2018-03-29 ENCOUNTER — Encounter (HOSPITAL_BASED_OUTPATIENT_CLINIC_OR_DEPARTMENT_OTHER): Payer: Self-pay | Admitting: Adult Health

## 2018-03-29 ENCOUNTER — Emergency Department (HOSPITAL_BASED_OUTPATIENT_CLINIC_OR_DEPARTMENT_OTHER)
Admission: EM | Admit: 2018-03-29 | Discharge: 2018-03-29 | Disposition: A | Discharge status: Home / Self Care | Payer: BLUE CROSS/BLUE SHIELD | Attending: Emergency Medicine | Admitting: Emergency Medicine

## 2018-03-29 ENCOUNTER — Other Ambulatory Visit: Payer: Self-pay

## 2018-03-29 ENCOUNTER — Emergency Department (HOSPITAL_BASED_OUTPATIENT_CLINIC_OR_DEPARTMENT_OTHER): Payer: BLUE CROSS/BLUE SHIELD

## 2018-03-29 DIAGNOSIS — I10 Essential (primary) hypertension: Secondary | ICD-10-CM | POA: Insufficient documentation

## 2018-03-29 DIAGNOSIS — R110 Nausea: Secondary | ICD-10-CM | POA: Insufficient documentation

## 2018-03-29 DIAGNOSIS — R0602 Shortness of breath: Secondary | ICD-10-CM | POA: Insufficient documentation

## 2018-03-29 DIAGNOSIS — F17210 Nicotine dependence, cigarettes, uncomplicated: Secondary | ICD-10-CM | POA: Diagnosis not present

## 2018-03-29 DIAGNOSIS — R002 Palpitations: Secondary | ICD-10-CM | POA: Diagnosis present

## 2018-03-29 DIAGNOSIS — R0789 Other chest pain: Secondary | ICD-10-CM | POA: Insufficient documentation

## 2018-03-29 DIAGNOSIS — R42 Dizziness and giddiness: Secondary | ICD-10-CM | POA: Insufficient documentation

## 2018-03-29 DIAGNOSIS — Z79899 Other long term (current) drug therapy: Secondary | ICD-10-CM | POA: Diagnosis not present

## 2018-03-29 DIAGNOSIS — E876 Hypokalemia: Secondary | ICD-10-CM | POA: Insufficient documentation

## 2018-03-29 DIAGNOSIS — E109 Type 1 diabetes mellitus without complications: Secondary | ICD-10-CM | POA: Insufficient documentation

## 2018-03-29 DIAGNOSIS — E860 Dehydration: Secondary | ICD-10-CM | POA: Insufficient documentation

## 2018-03-29 LAB — CBC WITH DIFFERENTIAL/PLATELET
BASOS ABS: 0 10*3/uL (ref 0.0–0.1)
BASOS PCT: 0 %
EOS PCT: 1 %
Eosinophils Absolute: 0.1 10*3/uL (ref 0.0–0.7)
HEMATOCRIT: 40.5 % (ref 39.0–52.0)
Hemoglobin: 15 g/dL (ref 13.0–17.0)
LYMPHS PCT: 14 %
Lymphs Abs: 1.4 10*3/uL (ref 0.7–4.0)
MCH: 30.1 pg (ref 26.0–34.0)
MCHC: 37 g/dL — ABNORMAL HIGH (ref 30.0–36.0)
MCV: 81.2 fL (ref 78.0–100.0)
MONOS PCT: 7 %
Monocytes Absolute: 0.7 10*3/uL (ref 0.1–1.0)
NEUTROS ABS: 7.7 10*3/uL (ref 1.7–7.7)
Neutrophils Relative %: 78 %
Platelets: 185 10*3/uL (ref 150–400)
RBC: 4.99 MIL/uL (ref 4.22–5.81)
RDW: 14.3 % (ref 11.5–15.5)
WBC: 9.9 10*3/uL (ref 4.0–10.5)

## 2018-03-29 LAB — COMPREHENSIVE METABOLIC PANEL
ALT: 11 U/L — AB (ref 17–63)
AST: 16 U/L (ref 15–41)
Albumin: 4.1 g/dL (ref 3.5–5.0)
Alkaline Phosphatase: 71 U/L (ref 38–126)
Anion gap: 11 (ref 5–15)
BILIRUBIN TOTAL: 1.2 mg/dL (ref 0.3–1.2)
BUN: 12 mg/dL (ref 6–20)
CO2: 22 mmol/L (ref 22–32)
CREATININE: 0.78 mg/dL (ref 0.61–1.24)
Calcium: 8.8 mg/dL — ABNORMAL LOW (ref 8.9–10.3)
Chloride: 104 mmol/L (ref 101–111)
Glucose, Bld: 242 mg/dL — ABNORMAL HIGH (ref 65–99)
POTASSIUM: 3.1 mmol/L — AB (ref 3.5–5.1)
Sodium: 137 mmol/L (ref 135–145)
TOTAL PROTEIN: 7 g/dL (ref 6.5–8.1)

## 2018-03-29 LAB — URINALYSIS, ROUTINE W REFLEX MICROSCOPIC
Bilirubin Urine: NEGATIVE
GLUCOSE, UA: 250 mg/dL — AB
HGB URINE DIPSTICK: NEGATIVE
Ketones, ur: NEGATIVE mg/dL
Leukocytes, UA: NEGATIVE
Nitrite: NEGATIVE
Protein, ur: NEGATIVE mg/dL
pH: 6 (ref 5.0–8.0)

## 2018-03-29 LAB — T4, FREE: Free T4: 1.22 ng/dL (ref 0.82–1.77)

## 2018-03-29 LAB — CBG MONITORING, ED: Glucose-Capillary: 214 mg/dL — ABNORMAL HIGH (ref 65–99)

## 2018-03-29 LAB — TROPONIN I
Troponin I: <0.03 ng/mL (ref <0.03)
Troponin I: <0.03 ng/mL (ref <0.03)

## 2018-03-29 LAB — BRAIN NATRIURETIC PEPTIDE: B NATRIURETIC PEPTIDE 5: 9.6 pg/mL (ref 0.0–100.0)

## 2018-03-29 LAB — D-DIMER, QUANTITATIVE: D-Dimer, Quant: 0.33 ug/mL-FEU (ref 0.00–0.50)

## 2018-03-29 LAB — TSH: TSH: 1.135 u[IU]/mL (ref 0.350–4.500)

## 2018-03-29 MED ORDER — METOPROLOL SUCCINATE ER 50 MG PO TB24: 50.0000 mg | ORAL_TABLET | Freq: Every day | ORAL | 0 refills | Status: DC

## 2018-03-29 MED ORDER — SODIUM CHLORIDE 0.9 % IV BOLUS
1000.0000 mL | Freq: Once | INTRAVENOUS | Status: AC
  Administered 2018-03-29: 1000 mL via INTRAVENOUS

## 2018-03-29 MED ORDER — POTASSIUM CHLORIDE CRYS ER 20 MEQ PO TBCR
40.0000 meq | EXTENDED_RELEASE_TABLET | Freq: Once | ORAL | Status: AC
Start: 2018-03-29 — End: 2018-03-29
  Administered 2018-03-29: 40 meq via ORAL
  Filled 2018-03-29: qty 2

## 2018-03-29 MED ORDER — BENAZEPRIL HCL 40 MG PO TABS: 40.0000 mg | ORAL_TABLET | Freq: Every day | ORAL | 0 refills | Status: DC

## 2018-03-29 MED ORDER — AMLODIPINE BESYLATE 10 MG PO TABS: 10.0000 mg | ORAL_TABLET | Freq: Every day | ORAL | 0 refills | Status: DC

## 2018-03-29 MED ORDER — METOPROLOL TARTRATE 5 MG/5ML IV SOLN
5.0000 mg | Freq: Once | INTRAVENOUS | Status: AC
  Administered 2018-03-29: 5 mg via INTRAVENOUS
  Filled 2018-03-29: qty 5

## 2018-03-29 MED ORDER — SODIUM CHLORIDE 0.9 % IV BOLUS
500.0000 mL | Freq: Once | INTRAVENOUS | Status: AC
  Administered 2018-03-29: 500 mL via INTRAVENOUS

## 2018-03-29 NOTE — ED Notes (Signed)
Patient transported to X-ray 

## 2018-03-29 NOTE — ED Notes (Signed)
Pt and family understood dc material. NAD noted. Scripts given at dc 

## 2018-03-29 NOTE — ED Triage Notes (Signed)
Presents with one hour of feeling palpitations, left shoulder discomfort and lightheadedness associated with nausea, SOB and diaphoresis. HR at this time 118.
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
## 2018-05-23 MED FILL — BUPRENORPHINE HCL-NALOXONE: 8-2 | 30 days supply | Qty: 60 | Fill #0

## 2018-06-21 MED FILL — BUPRENORPHINE HCL-NALOXONE: 8-2 | 30 days supply | Qty: 60 | Fill #0

## 2018-06-21 MED FILL — DULoxetine HCL 30 MG CPEP: 30 | 30 days supply | Qty: 30 | Fill #0

## 2018-07-19 MED FILL — SUBOXONE 8 MG-2 MG SL FILM: 8-2 | 30 days supply | Qty: 90 | Fill #0

## 2018-07-19 MED FILL — DULoxetine HCL 30 MG CPEP: 30 | 30 days supply | Qty: 90 | Fill #0

## 2021-08-25 ENCOUNTER — Ambulatory Visit: Payer: BLUE CROSS/BLUE SHIELD | Admitting: Orthopedic Surgery

## 2021-09-08 ENCOUNTER — Telehealth: Payer: Self-pay | Admitting: Orthopedic Surgery

## 2021-09-08 ENCOUNTER — Ambulatory Visit: Payer: Self-pay | Admitting: Orthopedic Surgery

## 2021-09-15 ENCOUNTER — Encounter (HOSPITAL_COMMUNITY): Payer: Self-pay | Admitting: Internal Medicine

## 2021-09-15 ENCOUNTER — Emergency Department (HOSPITAL_COMMUNITY): Payer: Medicaid Other

## 2021-09-15 ENCOUNTER — Inpatient Hospital Stay (HOSPITAL_COMMUNITY)
Admission: EM | Admit: 2021-09-15 | Discharge: 2021-09-17 | DRG: 919 | Disposition: A | Discharge status: Home / Self Care | Payer: Medicaid Other | Attending: Internal Medicine | Admitting: Internal Medicine

## 2021-09-15 DIAGNOSIS — F410 Panic disorder [episodic paroxysmal anxiety] without agoraphobia: Secondary | ICD-10-CM | POA: Diagnosis present

## 2021-09-15 DIAGNOSIS — Z6831 Body mass index (BMI) 31.0-31.9, adult: Secondary | ICD-10-CM

## 2021-09-15 DIAGNOSIS — Z823 Family history of stroke: Secondary | ICD-10-CM

## 2021-09-15 DIAGNOSIS — N179 Acute kidney failure, unspecified: Secondary | ICD-10-CM | POA: Diagnosis present

## 2021-09-15 DIAGNOSIS — F32A Depression, unspecified: Secondary | ICD-10-CM | POA: Diagnosis present

## 2021-09-15 DIAGNOSIS — F17210 Nicotine dependence, cigarettes, uncomplicated: Secondary | ICD-10-CM | POA: Diagnosis present

## 2021-09-15 DIAGNOSIS — G894 Chronic pain syndrome: Secondary | ICD-10-CM | POA: Diagnosis present

## 2021-09-15 DIAGNOSIS — E876 Hypokalemia: Secondary | ICD-10-CM | POA: Diagnosis present

## 2021-09-15 DIAGNOSIS — E1010 Type 1 diabetes mellitus with ketoacidosis without coma: Secondary | ICD-10-CM

## 2021-09-15 DIAGNOSIS — Z800 Family history of malignant neoplasm of digestive organs: Secondary | ICD-10-CM

## 2021-09-15 DIAGNOSIS — D72829 Elevated white blood cell count, unspecified: Secondary | ICD-10-CM | POA: Diagnosis present

## 2021-09-15 DIAGNOSIS — G2581 Restless legs syndrome: Secondary | ICD-10-CM | POA: Diagnosis present

## 2021-09-15 DIAGNOSIS — Z833 Family history of diabetes mellitus: Secondary | ICD-10-CM

## 2021-09-15 DIAGNOSIS — G9341 Metabolic encephalopathy: Secondary | ICD-10-CM | POA: Diagnosis present

## 2021-09-15 DIAGNOSIS — Z20822 Contact with and (suspected) exposure to covid-19: Secondary | ICD-10-CM | POA: Diagnosis present

## 2021-09-15 DIAGNOSIS — Z79899 Other long term (current) drug therapy: Secondary | ICD-10-CM

## 2021-09-15 DIAGNOSIS — E860 Dehydration: Secondary | ICD-10-CM | POA: Diagnosis present

## 2021-09-15 DIAGNOSIS — I10 Essential (primary) hypertension: Secondary | ICD-10-CM | POA: Diagnosis present

## 2021-09-15 DIAGNOSIS — Z794 Long term (current) use of insulin: Secondary | ICD-10-CM | POA: Diagnosis not present

## 2021-09-15 DIAGNOSIS — Z8249 Family history of ischemic heart disease and other diseases of the circulatory system: Secondary | ICD-10-CM

## 2021-09-15 DIAGNOSIS — Z8261 Family history of arthritis: Secondary | ICD-10-CM | POA: Diagnosis not present

## 2021-09-15 DIAGNOSIS — Y848 Other medical procedures as the cause of abnormal reaction of the patient, or of later complication, without mention of misadventure at the time of the procedure: Secondary | ICD-10-CM | POA: Diagnosis present

## 2021-09-15 DIAGNOSIS — E785 Hyperlipidemia, unspecified: Secondary | ICD-10-CM | POA: Diagnosis present

## 2021-09-15 DIAGNOSIS — Z83438 Family history of other disorder of lipoprotein metabolism and other lipidemia: Secondary | ICD-10-CM

## 2021-09-15 DIAGNOSIS — Z87820 Personal history of traumatic brain injury: Secondary | ICD-10-CM

## 2021-09-15 DIAGNOSIS — E669 Obesity, unspecified: Secondary | ICD-10-CM | POA: Diagnosis present

## 2021-09-15 DIAGNOSIS — K219 Gastro-esophageal reflux disease without esophagitis: Secondary | ICD-10-CM | POA: Diagnosis present

## 2021-09-15 DIAGNOSIS — Z813 Family history of other psychoactive substance abuse and dependence: Secondary | ICD-10-CM

## 2021-09-15 DIAGNOSIS — T85694A Other mechanical complication of insulin pump, initial encounter: Principal | ICD-10-CM | POA: Diagnosis present

## 2021-09-15 DIAGNOSIS — G9340 Encephalopathy, unspecified: Secondary | ICD-10-CM | POA: Diagnosis present

## 2021-09-15 DIAGNOSIS — E1110 Type 2 diabetes mellitus with ketoacidosis without coma: Secondary | ICD-10-CM | POA: Diagnosis present

## 2021-09-15 LAB — BASIC METABOLIC PANEL
Anion gap: 29 — ABNORMAL HIGH (ref 5–15)
BUN: 36 mg/dL — ABNORMAL HIGH (ref 6–20)
CO2: 12 mmol/L — ABNORMAL LOW (ref 22–32)
Calcium: 10 mg/dL (ref 8.9–10.3)
Chloride: 92 mmol/L — ABNORMAL LOW (ref 98–111)
Creatinine, Ser: 1.8 mg/dL — ABNORMAL HIGH (ref 0.61–1.24)
GFR, Estimated: 45 mL/min — ABNORMAL LOW (ref >60)
Glucose, Bld: 730 mg/dL (ref 70–99)
Potassium: 5.2 mmol/L — ABNORMAL HIGH (ref 3.5–5.1)
Sodium: 133 mmol/L — ABNORMAL LOW (ref 135–145)

## 2021-09-15 LAB — I-STAT VENOUS BLOOD GAS, ED
Acid-base deficit: 13 mmol/L — ABNORMAL HIGH (ref 0.0–2.0)
Bicarbonate: 11.2 mmol/L — ABNORMAL LOW (ref 20.0–28.0)
Calcium, Ion: 1.04 mmol/L — ABNORMAL LOW (ref 1.15–1.40)
HCT: 49 % (ref 39.0–52.0)
Hemoglobin: 16.7 g/dL (ref 13.0–17.0)
O2 Saturation: 94 %
Potassium: 5.1 mmol/L (ref 3.5–5.1)
Sodium: 131 mmol/L — ABNORMAL LOW (ref 135–145)
TCO2: 12 mmol/L — ABNORMAL LOW (ref 22–32)
pCO2, Ven: 23.5 mmHg — ABNORMAL LOW (ref 44.0–60.0)
pH, Ven: 7.284 (ref 7.250–7.430)
pO2, Ven: 79 mmHg — ABNORMAL HIGH (ref 32.0–45.0)

## 2021-09-15 LAB — CBG MONITORING, ED
Glucose-Capillary: 337 mg/dL — ABNORMAL HIGH (ref 70–99)
Glucose-Capillary: 376 mg/dL — ABNORMAL HIGH (ref 70–99)
Glucose-Capillary: 435 mg/dL — ABNORMAL HIGH (ref 70–99)
Glucose-Capillary: 475 mg/dL — ABNORMAL HIGH (ref 70–99)
Glucose-Capillary: 505 mg/dL (ref 70–99)
Glucose-Capillary: 512 mg/dL (ref 70–99)
Glucose-Capillary: 536 mg/dL (ref 70–99)
Glucose-Capillary: 540 mg/dL (ref 70–99)
Glucose-Capillary: >600 mg/dL (ref 70–99)

## 2021-09-15 LAB — URINALYSIS, ROUTINE W REFLEX MICROSCOPIC
Bacteria, UA: NONE SEEN
Bilirubin Urine: NEGATIVE
Glucose, UA: >=500 mg/dL — AB
Ketones, ur: 80 mg/dL — AB
Leukocytes,Ua: NEGATIVE
Nitrite: NEGATIVE
Protein, ur: NEGATIVE mg/dL
Specific Gravity, Urine: 1.02 (ref 1.005–1.030)
pH: 5 (ref 5.0–8.0)

## 2021-09-15 LAB — I-STAT CHEM 8, ED
BUN: 39 mg/dL — ABNORMAL HIGH (ref 6–20)
Calcium, Ion: 1.06 mmol/L — ABNORMAL LOW (ref 1.15–1.40)
Chloride: 102 mmol/L (ref 98–111)
Creatinine, Ser: 1 mg/dL (ref 0.61–1.24)
Glucose, Bld: >700 mg/dL (ref 70–99)
HCT: 47 % (ref 39.0–52.0)
Hemoglobin: 16 g/dL (ref 13.0–17.0)
Potassium: 5.2 mmol/L — ABNORMAL HIGH (ref 3.5–5.1)
Sodium: 132 mmol/L — ABNORMAL LOW (ref 135–145)
TCO2: 14 mmol/L — ABNORMAL LOW (ref 22–32)

## 2021-09-15 LAB — HEPATIC FUNCTION PANEL
ALT: 22 U/L (ref 0–44)
AST: 21 U/L (ref 15–41)
Albumin: 4.3 g/dL (ref 3.5–5.0)
Alkaline Phosphatase: 130 U/L — ABNORMAL HIGH (ref 38–126)
Bilirubin, Direct: 0.3 mg/dL — ABNORMAL HIGH (ref 0.0–0.2)
Indirect Bilirubin: 2.1 mg/dL — ABNORMAL HIGH (ref 0.3–0.9)
Total Bilirubin: 2.4 mg/dL — ABNORMAL HIGH (ref 0.3–1.2)
Total Protein: 7.7 g/dL (ref 6.5–8.1)

## 2021-09-15 LAB — RAPID URINE DRUG SCREEN, HOSP PERFORMED
Amphetamines: NOT DETECTED
Barbiturates: NOT DETECTED
Benzodiazepines: POSITIVE — AB
Cocaine: NOT DETECTED
Opiates: NOT DETECTED
Tetrahydrocannabinol: NOT DETECTED

## 2021-09-15 LAB — CBC
HCT: 46.3 % (ref 39.0–52.0)
Hemoglobin: 16.4 g/dL (ref 13.0–17.0)
MCH: 32.7 pg (ref 26.0–34.0)
MCHC: 35.4 g/dL (ref 30.0–36.0)
MCV: 92.2 fL (ref 80.0–100.0)
Platelets: 291 10*3/uL (ref 150–400)
RBC: 5.02 MIL/uL (ref 4.22–5.81)
RDW: 13 % (ref 11.5–15.5)
WBC: 18.2 10*3/uL — ABNORMAL HIGH (ref 4.0–10.5)
nRBC: 0 % (ref 0.0–0.2)

## 2021-09-15 LAB — RESP PANEL BY RT-PCR (FLU A&B, COVID) ARPGX2
Influenza A by PCR: NEGATIVE
Influenza B by PCR: NEGATIVE
SARS Coronavirus 2 by RT PCR: NEGATIVE

## 2021-09-15 LAB — ETHANOL: Alcohol, Ethyl (B): <10 mg/dL (ref <10)

## 2021-09-15 LAB — BETA-HYDROXYBUTYRIC ACID: Beta-Hydroxybutyric Acid: >8 mmol/L — ABNORMAL HIGH (ref 0.05–0.27)

## 2021-09-15 LAB — HEMOGLOBIN A1C
Hgb A1c MFr Bld: 9.5 % — ABNORMAL HIGH (ref 4.8–5.6)
Mean Plasma Glucose: 225.95 mg/dL

## 2021-09-15 LAB — ACETAMINOPHEN LEVEL: Acetaminophen (Tylenol), Serum: <10 ug/mL — ABNORMAL LOW (ref 10–30)

## 2021-09-15 LAB — SALICYLATE LEVEL: Salicylate Lvl: <7 mg/dL — ABNORMAL LOW (ref 7.0–30.0)

## 2021-09-15 MED ORDER — ENOXAPARIN SODIUM 40 MG/0.4ML IJ SOSY
40.0000 mg | PREFILLED_SYRINGE | INTRAMUSCULAR | Status: DC
  Administered 2021-09-16: 40 mg via SUBCUTANEOUS
  Filled 2021-09-15: qty 0.4

## 2021-09-15 MED ORDER — LABETALOL HCL 5 MG/ML IV SOLN
10.0000 mg | INTRAVENOUS | Status: DC | PRN
  Administered 2021-09-16 – 2021-09-17 (×2): 10 mg via INTRAVENOUS
  Filled 2021-09-15 (×2): qty 4

## 2021-09-15 MED ORDER — LACTATED RINGERS IV SOLN: INTRAVENOUS | Status: DC

## 2021-09-15 MED ORDER — DEXTROSE IN LACTATED RINGERS 5 % IV SOLN: INTRAVENOUS | Status: DC

## 2021-09-15 MED ORDER — INSULIN REGULAR(HUMAN) IN NACL 100-0.9 UT/100ML-% IV SOLN
INTRAVENOUS | Status: DC
  Administered 2021-09-15: 13 [IU]/h via INTRAVENOUS
  Filled 2021-09-15: qty 100

## 2021-09-15 MED ORDER — DEXTROSE 50 % IV SOLN: 0.0000 mL | INTRAVENOUS | Status: DC | PRN

## 2021-09-15 MED ORDER — INSULIN REGULAR(HUMAN) IN NACL 100-0.9 UT/100ML-% IV SOLN
INTRAVENOUS | Status: DC
  Administered 2021-09-15: 24 [IU]/h via INTRAVENOUS
  Administered 2021-09-16: 1.2 [IU]/h via INTRAVENOUS
  Filled 2021-09-15 (×2): qty 100

## 2021-09-15 MED ORDER — LACTATED RINGERS IV BOLUS
20.0000 mL/kg | Freq: Once | INTRAVENOUS | Status: AC
  Administered 2021-09-15: 1890 mL via INTRAVENOUS

## 2021-09-15 MED ORDER — INSULIN ASPART 100 UNIT/ML IV SOLN
5.0000 [IU] | Freq: Once | INTRAVENOUS | Status: AC
  Administered 2021-09-15: 5 [IU] via INTRAVENOUS

## 2021-09-15 MED ORDER — SODIUM CHLORIDE 0.9 % IV BOLUS
1000.0000 mL | Freq: Once | INTRAVENOUS | Status: AC
  Administered 2021-09-15: 1000 mL via INTRAVENOUS

## 2021-09-15 MED ORDER — LACTATED RINGERS IV BOLUS
2000.0000 mL | Freq: Once | INTRAVENOUS | Status: AC
  Administered 2021-09-15: 2000 mL via INTRAVENOUS
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
## 2021-09-15 NOTE — ED Notes (Signed)
Dr. Darl Householder EDP at [facility].
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
## 2021-09-15 NOTE — ED Triage Notes (Signed)
Patient brought to ED from an inpatient unit, patient was apparently visiting a patient when inpatient staff were alerted that this patient may be in DKA. Patient is lethargic, CBG reads "HI", was incontinent of urine, and has an insulin pump attached to left lower abdomen. Screen on insulin pump reads "Load Reservoir".

## 2021-09-15 NOTE — ED Notes (Signed)
Pt in radiology 

## 2021-09-15 NOTE — ED Provider Notes (Signed)
Emergency Medicine Provider Triage Evaluation Note  [name] , a 50 y.o. male  was evaluated in triage.  Patient was brought here from an inpatient unit, was visiting a patient on the floor when the staff was alerted that Mr. [name] may be in DKA.  History of type 1 diabetes.  On my exam patient is lethargic, was incontinent of urine.  Insulin pump attached to left lower abdomen, screen on insulin pump is reading "low reservoir".  Review of Systems  Positive: Hyperglycemia, urinary incontinence Negative:   Physical Exam  BP (!) 154/83   Pulse (!) 129   Resp 18   SpO2 97%  Gen:   Awake, no distress   Resp:  Normal effort  MSK:   Moves extremities without difficulty  Other:  Patient alert to person, unable to answer any of my questions, was incontinent of urine in triage  Medical Decision Making  Medically screening exam initiated at 5:45 PM.  Appropriate orders placed.  [name] was informed that the remainder of the evaluation will be completed by another provider, this initial triage assessment does not replace that evaluation, and the importance of remaining in the ED until their evaluation is complete.     [name] 09/15/21 1747    Mark Freeze, MD 09/15/21 2227

## 2021-09-16 ENCOUNTER — Other Ambulatory Visit: Payer: Self-pay

## 2021-09-16 ENCOUNTER — Inpatient Hospital Stay (HOSPITAL_COMMUNITY): Payer: Medicaid Other

## 2021-09-16 DIAGNOSIS — G9340 Encephalopathy, unspecified: Secondary | ICD-10-CM | POA: Diagnosis not present

## 2021-09-16 DIAGNOSIS — N179 Acute kidney failure, unspecified: Secondary | ICD-10-CM | POA: Diagnosis not present

## 2021-09-16 DIAGNOSIS — E1010 Type 1 diabetes mellitus with ketoacidosis without coma: Secondary | ICD-10-CM | POA: Diagnosis not present

## 2021-09-16 LAB — CBG MONITORING, ED
Glucose-Capillary: 142 mg/dL — ABNORMAL HIGH (ref 70–99)
Glucose-Capillary: 144 mg/dL — ABNORMAL HIGH (ref 70–99)
Glucose-Capillary: 162 mg/dL — ABNORMAL HIGH (ref 70–99)
Glucose-Capillary: 167 mg/dL — ABNORMAL HIGH (ref 70–99)
Glucose-Capillary: 196 mg/dL — ABNORMAL HIGH (ref 70–99)
Glucose-Capillary: 197 mg/dL — ABNORMAL HIGH (ref 70–99)
Glucose-Capillary: 213 mg/dL — ABNORMAL HIGH (ref 70–99)
Glucose-Capillary: 215 mg/dL — ABNORMAL HIGH (ref 70–99)
Glucose-Capillary: 223 mg/dL — ABNORMAL HIGH (ref 70–99)
Glucose-Capillary: 228 mg/dL — ABNORMAL HIGH (ref 70–99)
Glucose-Capillary: 232 mg/dL — ABNORMAL HIGH (ref 70–99)
Glucose-Capillary: 265 mg/dL — ABNORMAL HIGH (ref 70–99)
Glucose-Capillary: 296 mg/dL — ABNORMAL HIGH (ref 70–99)

## 2021-09-16 LAB — BASIC METABOLIC PANEL
Anion gap: 14 (ref 5–15)
Anion gap: 14 (ref 5–15)
Anion gap: 6 (ref 5–15)
BUN: 32 mg/dL — ABNORMAL HIGH (ref 6–20)
BUN: 32 mg/dL — ABNORMAL HIGH (ref 6–20)
BUN: 27 mg/dL — ABNORMAL HIGH (ref 6–20)
CO2: 18 mmol/L — ABNORMAL LOW (ref 22–32)
CO2: 19 mmol/L — ABNORMAL LOW (ref 22–32)
CO2: 26 mmol/L (ref 22–32)
Calcium: 9 mg/dL (ref 8.9–10.3)
Calcium: 9.2 mg/dL (ref 8.9–10.3)
Calcium: 9.1 mg/dL (ref 8.9–10.3)
Chloride: 108 mmol/L (ref 98–111)
Chloride: 106 mmol/L (ref 98–111)
Chloride: 109 mmol/L (ref 98–111)
Creatinine, Ser: 1.4 mg/dL — ABNORMAL HIGH (ref 0.61–1.24)
Creatinine, Ser: 1.41 mg/dL — ABNORMAL HIGH (ref 0.61–1.24)
Creatinine, Ser: 0.87 mg/dL (ref 0.61–1.24)
GFR, Estimated: >60 mL/min (ref >60)
GFR, Estimated: >60 mL/min (ref >60)
GFR, Estimated: >60 mL/min (ref >60)
Glucose, Bld: 295 mg/dL — ABNORMAL HIGH (ref 70–99)
Glucose, Bld: 276 mg/dL — ABNORMAL HIGH (ref 70–99)
Glucose, Bld: 156 mg/dL — ABNORMAL HIGH (ref 70–99)
Potassium: 3.8 mmol/L (ref 3.5–5.1)
Potassium: 3.5 mmol/L (ref 3.5–5.1)
Potassium: 3.1 mmol/L — ABNORMAL LOW (ref 3.5–5.1)
Sodium: 140 mmol/L (ref 135–145)
Sodium: 139 mmol/L (ref 135–145)
Sodium: 141 mmol/L (ref 135–145)

## 2021-09-16 LAB — BETA-HYDROXYBUTYRIC ACID
Beta-Hydroxybutyric Acid: 6.52 mmol/L — ABNORMAL HIGH (ref 0.05–0.27)
Beta-Hydroxybutyric Acid: 0.9 mmol/L — ABNORMAL HIGH (ref 0.05–0.27)

## 2021-09-16 LAB — TROPONIN I (HIGH SENSITIVITY): Troponin I (High Sensitivity): 26 ng/L — ABNORMAL HIGH (ref <18)

## 2021-09-16 LAB — GLUCOSE, CAPILLARY
Glucose-Capillary: 120 mg/dL — ABNORMAL HIGH (ref 70–99)
Glucose-Capillary: 95 mg/dL (ref 70–99)
Glucose-Capillary: 156 mg/dL — ABNORMAL HIGH (ref 70–99)
Glucose-Capillary: 241 mg/dL — ABNORMAL HIGH (ref 70–99)

## 2021-09-16 LAB — LACTIC ACID, PLASMA
Lactic Acid, Venous: 2.5 mmol/L (ref 0.5–1.9)
Lactic Acid, Venous: 2.5 mmol/L (ref 0.5–1.9)

## 2021-09-16 LAB — AMMONIA: Ammonia: 53 umol/L — ABNORMAL HIGH (ref 9–35)

## 2021-09-16 MED ORDER — INSULIN ASPART 100 UNIT/ML IJ SOLN
0.0000 [IU] | Freq: Three times a day (TID) | INTRAMUSCULAR | Status: DC
  Administered 2021-09-16: 3 [IU] via SUBCUTANEOUS

## 2021-09-16 MED ORDER — POTASSIUM CHLORIDE CRYS ER 20 MEQ PO TBCR
40.0000 meq | EXTENDED_RELEASE_TABLET | Freq: Once | ORAL | Status: AC
  Administered 2021-09-16: 40 meq via ORAL
  Filled 2021-09-16: qty 2

## 2021-09-16 MED ORDER — INSULIN GLARGINE-YFGN 100 UNIT/ML ~~LOC~~ SOLN
30.0000 [IU] | Freq: Every day | SUBCUTANEOUS | Status: DC
  Administered 2021-09-16: 30 [IU] via SUBCUTANEOUS
  Filled 2021-09-16 (×2): qty 0.3

## 2021-09-16 MED ORDER — INSULIN ASPART 100 UNIT/ML IJ SOLN
4.0000 [IU] | Freq: Three times a day (TID) | INTRAMUSCULAR | Status: DC
  Administered 2021-09-16: 4 [IU] via SUBCUTANEOUS

## 2021-09-16 NOTE — ED Notes (Signed)
Patient transported to MRI 

## 2021-09-16 NOTE — Progress Notes (Signed)
Inpatient Diabetes Program Recommendations  AACE/ADA: New Consensus Statement on Inpatient Glycemic Control (2015)  Target Ranges:  Prepandial:   less than 140 mg/dL      Peak postprandial:   less than 180 mg/dL (1-2 hours)      Critically ill patients:  140 - 180 mg/dL   Lab Results  Component Value Date   GLUCAP 167 (H) 09/16/2021   HGBA1C 9.5 (H) 09/15/2021    Review of Glycemic Control Results for RAYNALDO, FALCO (MRN [id number]) as of 09/16/2021 10:00  Ref. Range 09/16/2021 04:40 09/16/2021 05:43 09/16/2021 06:50 09/16/2021 08:06 09/16/2021 09:12  Glucose-Capillary Latest Ref Range: 70 - 99 mg/dL 215 (H) 228 (H) 196 (H) 223 (H) 167 (H)   Diabetes history: DM 1 (since age 50) Outpatient Diabetes medications:  Insulin pump/630G  ICR- 1 units/5 grams of CHO ISF- 1 units drops blood sugar 30 mg/dL Total basal insulin=48.7 units/24 hours Current orders for Inpatient glycemic control:  IV insulin/DKA order set  Inpatient Diabetes Program Recommendations:    Saw patient at bedside to assess insulin pump and what might have happened to precipitate DKA.  Patient very sleepy and many of his responses do not make sense.  His abdomen has many scabs/scarring.  He states that he changes site "when it runs out".  Reminded him that site should be changed every 72 hours. I assessed insulin pump (which is currently disconnected).  Per assessment of insulin pump, it appears that the insulin pump was suspended on Tuesday October 25, at 5:03 am.  There were no boluses on Sunday October 23.  On Monday October 24, patient boluses once at 1506 of 16 units.  At this point, do not recommend restart of insulin pump.    Once acidosis is cleared, consider Semglee 35 units 2 hours prior to d/c of insulin drip.  Also consider Novolog sensitive correction q 4 hours.  Patient will also need meal coverage to cover CHO intake (consider adding Novolog 6 units tid with meals-hold if patient eats less than 50% or  NPO).   Thanks,  Adah Perl, RN, BC-ADM Inpatient Diabetes Coordinator Pager [phone]  (8a-5p)

## 2021-09-16 NOTE — ED Notes (Signed)
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     09/16/2021 10:40 AM Patient Name: [name] MRN: [id number] Epilepsy Attending: Lora Havens Referring Physician/Provider: Dr Gean Birchwood Date:09/16/2021 Duration: 22.33 mins Patient history: 50yo M with ams. EEG to evaluate for seizure Level of alertness: Awake, asleep AEDs during EEG study: None Technical aspects: This EEG study was done with scalp electrodes positioned according to the 10-20 International system of  electrode placement. Electrical activity was acquired at a sampling rate of 500Hz  and reviewed with a high frequency filter of 70Hz  and a low frequency filter of 1Hz . EEG data were recorded continuously and digitally stored. Description: No posterior dominant rhythm was seen. Sleep was characterized by vertex waves, sleep spindles (12 to 14 Hz), maximal frontocentral region. EEG showed continuous generalized polymorphic 3 to 6 Hz theta-delta slowing admixed 15 to 18 Hz beta activity with irregular morphology distributed symmetrically and diffusely. Hyperventilation and photic stimulation were not performed.   ABNORMALITY - Continuous slow, generalized - Excessive beta, generalized IMPRESSION: This study is suggestive of moderate diffuse encephalopathy, nonspecific etiology. No seizures or epileptiform discharges were seen throughout the recording. [employer] (From admission, onward)     Start     Ordered   09/22/21 0500  Creatinine, serum  (enoxaparin (LOVENOX)    CrCl >/= 30 ml/min)  Weekly,   R     Comments: while on enoxaparin therapy    09/15/21 2104   09/16/21 6283  Basic metabolic panel  (Diabetes Ketoacidosis (DKA))  Now then every 8 hours,   STAT (with TIMED occurrences)      09/16/21 1207   09/16/21 1207  Culture, blood (routine x 2)  BLOOD CULTURE X 2,   R (with TIMED occurrences)      09/16/21 1207   09/15/21 2103  Beta-hydroxybutyric acid  (Diabetes Ketoacidosis (DKA))  Now then every 8 hours,   R (with TIMED occurrences)      09/15/21 2104   09/15/21 2102  HIV Antibody (routine testing w rflx)  (HIV Antibody (Routine testing w reflex) panel)  Once,   R        09/15/21 2104   09/15/21 2102  CBC  (enoxaparin (LOVENOX)    CrCl >/= 30 ml/min)  Once,   R       Comments: Baseline for enoxaparin therapy IF NOT ALREADY DRAWN.  Notify MD if PLT < 100 K.    09/15/21 2104            Vitals/Pain Today's Vitals   09/16/21 0657 09/16/21 0700  09/16/21 0900 09/16/21 1330  BP:  (!) 148/84 (!) 154/78 (!) 135/96  Pulse:  80 (!) 101 95  Resp:  12 15 15   Temp:   98.3 F (36.8 C)   TempSrc:   Oral   SpO2:  95% 99% 97%  Weight:      Height:      PainSc: Asleep       Isolation Precautions No active isolations  Medications Medications  enoxaparin (LOVENOX) injection 40 mg (0 mg Subcutaneous Hold 09/15/21 2200)  insulin regular, human (MYXREDLIN) 100 units/ 100 mL infusion (6 Units/hr Intravenous Rate/Dose Change 09/16/21 1346)  lactated ringers infusion (0 mLs Intravenous Stopped 09/16/21 0236)  dextrose  5 % in lactated ringers infusion ( Intravenous New Bag/Given 09/16/21 1121)  dextrose 50 % solution 0-50 mL (has no administration in time range)  labetalol (NORMODYNE) injection 10 mg (has no administration in time range)  sodium chloride 0.9 % bolus 1,000 mL (0 mLs Intravenous Stopped 09/15/21 1855)  insulin aspart (novoLOG) injection 5 Units (5 Units Intravenous Given 09/15/21 1817)  lactated ringers bolus 1,890 mL (0 mLs Intravenous Stopped 09/15/21 1922)  lactated ringers bolus 2,000 mL (0 mLs Intravenous Stopped 09/16/21 0126)  potassium chloride SA (KLOR-CON) CR tablet 40 mEq (40 mEq Oral Given 09/16/21 1349)    Mobility walks with person assist High fall risk   Focused Assessments Neuro Assessment Handoff:  Swallow screen pass? Yes  Cardiac Rhythm: Sinus tachycardia       Neuro Assessment: Exceptions to WDL Neuro Checks:      Last Documented NIHSS Modified Score:   Has TPA been given? No If patient is a Neuro Trauma and patient is going to OR before floor call report to [facility] nurse: [phone] or [phone]   R Recommendations: See Admitting Provider Note  Report given to:   Additional Notes: Pt now Alert and oriented  4 net edotool check at [place]

## 2021-09-16 NOTE — Progress Notes (Signed)
EEG done

## 2021-09-16 NOTE — Progress Notes (Addendum)
PROGRESS NOTE        PATIENT DETAILS Name: [name] Age: 50 y.o. Sex: male Date of Birth: [birth year] Admit Date: 09/15/2021 Admitting Physician Rise Patience, MD NGE:[id number], Bonnita Levan, MD  Brief Narrative: Patient is a 50 y.o. male with history of DM-1-on insulin pump-presented to the ED for acute metabolic encephalopathy-he was found to have DKA.  See below for further details.    Subjective: Mentation seems to have improved-he is more awake than what was described in the H&P.  He is still somewhat confused but able to answer simple questions appropriately (told me his name, spouse's name etc.)  Objective: Vitals: Blood pressure (!) 154/78, pulse (!) 101, temperature 98.3 F (36.8 C), temperature source Oral, resp. rate 15, height 5\' 10"  (1.778 m), weight 94.5 kg, SpO2 99 %.   Exam: Gen Exam: Still slow-still somewhat confused-and not in any distress.   HEENT:atraumatic, normocephalic Chest: B/L clear to auscultation anteriorly CVS:S1S2 regular Abdomen:soft non tender, non distended Extremities:no edema Neurology: Non focal Skin: no rash  Pertinent Labs/Radiology: WBC: 18.2 Hb: 16.4 Na: 141 K: 3.1 Creatinine: 0.87 Anion gap: 6  10/26>>CXR: No PNA 10/26>> CT head: No acute intracranial abnormality 10/27>> MRI brain: No acute infarction/hemorrhage.  10/27>> EEG: No seizures  Assessment/Plan: Acute metabolic encephalopathy: Likely due to DKA-seems to be improving-he is more alert but still lethargic-continue supportive care-and follow clinical course.  CT/MRI imaging reassuring.  EEG without seizures.  DKA: Resolved-anion gap is closed with IV insulin-since still sleepy-and unable to tolerate oral intake-reasonable to keep on IV insulin until mentation improves enough to tolerate oral intake.  DM-1: On insulin pump-Per interrogation of insulin pump by diabetic coordinator-it appears that the pump was suspended on 10/25 at 5 AM.  We  will plan to transition him him to bolus/basal regimen when his encephalopathy clears further.  Do not plan to restart insulin pump at this point.  Addendum: More awake/alert-will start him on a diet and transition him off the insulin gtt.  AKI: Likely hemodynamically mediated from dehydration/DKA-improving.  Hypokalemia: Replete and recheck  Leukocytosis: Likely due to DKA-afebrile-no obvious signs of infection-check blood cultures.  Monitor off antimicrobial therapy.  HTN: BP stable-resume antihypertensives when able/mentation allows  HLD: Resume statin when able/mentation allows  Procedures: None Consults: None DVT Prophylaxis: Lovenox Code Status:Full code  Family Communication: Spouse-Renay-[phone]-updated over the phone  Time spent: 35 minutes-Greater than 50% of this time was spent in counseling, explanation of diagnosis, planning of further management, and coordination of care.  Diet: Diet Order             Diet NPO time specified Except for: Ice Chips, Sips with Meds, Other (See Comments)  Diet effective now                    Disposition Plan: Status is: Inpatient  Remains inpatient appropriate because: DKA-encephalopathy-requiring inpatient level of treatment   Barriers to Discharge: Resolving encephalopathy-DKA-on IV insulin-not yet stable for discharge.  Antimicrobial agents: Anti-infectives (From admission, onward)    None        MEDICATIONS: Scheduled Meds:  enoxaparin (LOVENOX) injection  40 mg Subcutaneous Q24H   Continuous Infusions:  dextrose 5% lactated ringers 125 mL/hr at 09/16/21 1121   insulin 3.2 Units/hr (09/16/21 1124)   lactated ringers Stopped (09/16/21 0236)   PRN Meds:.dextrose, labetalol  I have personally reviewed following labs and imaging studies  LABORATORY DATA: CBC: Recent Labs  Lab 09/15/21 1745 09/15/21 1759 09/15/21 1808  WBC 18.2*  --   --   HGB 16.4 16.7 16.0  HCT 46.3 49.0 47.0  MCV 92.2   --   --   PLT 291  --   --     Basic Metabolic Panel: Recent Labs  Lab 09/15/21 1745 09/15/21 1759 09/15/21 1808 09/16/21 0019 09/16/21 0218 09/16/21 0903  NA 133* 131* 132* 140 139 141  K 5.2* 5.1 5.2* 3.8 3.5 3.1*  CL 92*  --  102 108 106 109  CO2 12*  --   --  18* 19* 26  GLUCOSE 730*  --  >700* 295* 276* 156*  BUN 36*  --  39* 32* 32* 27*  CREATININE 1.80*  --  1.00 1.40* 1.41* 0.87  CALCIUM 10.0  --   --  9.0 9.2 9.1    GFR: Estimated Creatinine Clearance: 117.2 mL/min (by C-G formula based on SCr of 0.87 mg/dL).  Liver Function Tests: Recent Labs  Lab 09/15/21 1745  AST 21  ALT 22  ALKPHOS 130*  BILITOT 2.4*  PROT 7.7  ALBUMIN 4.3   No results for input(s): LIPASE, AMYLASE in the last 168 hours. Recent Labs  Lab 09/15/21 2245  AMMONIA 53*    Coagulation Profile: No results for input(s): INR, PROTIME in the last 168 hours.  Cardiac Enzymes: No results for input(s): CKTOTAL, CKMB, CKMBINDEX, TROPONINI in the last 168 hours.  BNP (last 3 results) No results for input(s): PROBNP in the last 8760 hours.  Lipid Profile: No results for input(s): CHOL, HDL, LDLCALC, TRIG, CHOLHDL, LDLDIRECT in the last 72 hours.  Thyroid Function Tests: No results for input(s): TSH, T4TOTAL, FREET4, T3FREE, THYROIDAB in the last 72 hours.  Anemia Panel: No results for input(s): VITAMINB12, FOLATE, FERRITIN, TIBC, IRON, RETICCTPCT in the last 72 hours.  Urine analysis:    Component Value Date/Time   COLORURINE STRAW (A) 09/15/2021 1745   APPEARANCEUR CLEAR 09/15/2021 1745   LABSPEC 1.020 09/15/2021 1745   PHURINE 5.0 09/15/2021 1745   GLUCOSEU >=500 (A) 09/15/2021 1745   HGBUR SMALL (A) 09/15/2021 1745   BILIRUBINUR NEGATIVE 09/15/2021 1745   BILIRUBINUR negative 12/07/2012 1424   KETONESUR 80 (A) 09/15/2021 1745   PROTEINUR NEGATIVE 09/15/2021 1745   UROBILINOGEN 0.2 12/07/2012 1424   NITRITE NEGATIVE 09/15/2021 1745   LEUKOCYTESUR NEGATIVE 09/15/2021 1745     Sepsis Labs: Lactic Acid, Venous    Component Value Date/Time   LATICACIDVEN 2.5 (HH) 09/16/2021 0019    MICROBIOLOGY: Recent Results (from the past 240 hour(s))  Resp Panel by RT-PCR (Flu A&B, Covid) Nasopharyngeal Swab     Status: None   Collection Time: 09/15/21  6:15 PM   Specimen: Nasopharyngeal Swab; Nasopharyngeal(NP) swabs in vial transport medium  Result Value Ref Range Status   SARS Coronavirus 2 by RT PCR NEGATIVE NEGATIVE Final    Comment: (NOTE) SARS-CoV-2 target nucleic acids are NOT DETECTED.  The SARS-CoV-2 RNA is generally detectable in upper respiratory specimens during the acute phase of infection. The lowest concentration of SARS-CoV-2 viral copies this assay can detect is 138 copies/mL. A negative result does not preclude SARS-Cov-2 infection and should not be used as the sole basis for treatment or other patient management decisions. A negative result may occur with  improper specimen collection/handling, submission of specimen other than nasopharyngeal swab, presence of viral mutation(s) within the areas targeted  by this assay, and inadequate number of viral copies(<138 copies/mL). A negative result must be combined with clinical observations, patient history, and epidemiological information. The expected result is Negative.  Fact Sheet for Patients:  EntrepreneurPulse.com.au  Fact Sheet for Healthcare Providers:  IncredibleEmployment.be  This test is no t yet approved or cleared by the [place] FDA and  has been authorized for detection and/or diagnosis of SARS-CoV-2 by FDA under an Emergency Use Authorization (EUA). This EUA will remain  in effect (meaning this test can be used) for the duration of the COVID-19 declaration under Section 564(b)(1) of the Act, 21 U.S.C.section 360bbb-3(b)(1), unless the authorization is terminated  or revoked sooner.       Influenza A by PCR NEGATIVE NEGATIVE Final    Influenza B by PCR NEGATIVE NEGATIVE Final    Comment: (NOTE) The Xpert Xpress SARS-CoV-2/FLU/RSV plus assay is intended as an aid in the diagnosis of influenza from Nasopharyngeal swab specimens and should not be used as a sole basis for treatment. Nasal washings and aspirates are unacceptable for Xpert Xpress SARS-CoV-2/FLU/RSV testing.  Fact Sheet for Patients: EntrepreneurPulse.com.au  Fact Sheet for Healthcare Providers: IncredibleEmployment.be  This test is not yet approved or cleared by the [place] FDA and has been authorized for detection and/or diagnosis of SARS-CoV-2 by FDA under an Emergency Use Authorization (EUA). This EUA will remain in effect (meaning this test can be used) for the duration of the COVID-19 declaration under Section 564(b)(1) of the Act, 21 U.S.C. section 360bbb-3(b)(1), unless the authorization is terminated or revoked.  Performed at [facility], [place] [address] [place], [place] [address]     RADIOLOGY STUDIES/RESULTS: CT HEAD WO CONTRAST (5MM)  Result Date: 09/15/2021 CLINICAL DATA:  Altered mental status. EXAM: CT HEAD WITHOUT CONTRAST TECHNIQUE: Contiguous axial images were obtained from the base of the skull through the vertex without intravenous contrast. COMPARISON:  None. FINDINGS: Brain: The ventricles and sulci appropriate size for patient's age. The gray-white matter discrimination is preserved. There is no acute intracranial hemorrhage. No mass effect or midline shift. No extra-axial fluid collection. Vascular: No hyperdense vessel or unexpected calcification. Skull: Normal. Negative for fracture or focal lesion. Sinuses/Orbits: No acute finding. Other: None IMPRESSION: Unremarkable noncontrast CT of the brain. Electronically Signed   By: Anner Crete M.D.   On: 09/15/2021 19:24   MR BRAIN WO CONTRAST  Result Date: 09/16/2021 CLINICAL DATA:  Hyperglycemia and altered mental status EXAM:  MRI HEAD WITHOUT CONTRAST TECHNIQUE: Multiplanar, multiecho pulse sequences of the brain and surrounding structures were obtained without intravenous contrast. COMPARISON:  Head CT from yesterday FINDINGS: Brain: No acute infarction, hemorrhage, hydrocephalus, extra-axial collection or mass lesion. Chronic lacune in the left paramedian pons and medial left thalamus. Vascular: Normal flow voids. Skull and upper cervical spine: Normal marrow signal. Sinuses/Orbits: Negative. IMPRESSION: 1. No acute or reversible finding. 2. 2 chronic lacunes. Electronically Signed   By: Jorje Guild M.D.   On: 09/16/2021 04:04   DG Chest Port 1 View  Result Date: 09/15/2021 CLINICAL DATA:  Altered mental status. EXAM: PORTABLE CHEST 1 VIEW COMPARISON:  Chest radiograph dated 07/18/2020. FINDINGS: Faint bilateral lower lobe densities, likely chronic or atelectasis. No focal consolidation, pleural effusion, or pneumothorax. The cardiac silhouette is within normal limits. No acute osseous pathology. IMPRESSION: No active disease. Electronically Signed   By: Anner Crete M.D.   On: 09/15/2021 19:10   EEG adult  Result Date: 09/16/2021 Lora Havens, MD  09/16/2021 10:40 AM Patient Name: [name] MRN: [id number] Epilepsy Attending: Lora Havens Referring Physician/Provider: Dr Gean Birchwood Date:09/16/2021 Duration: 22.33 mins Patient history: 50yo M with ams. EEG to evaluate for seizure Level of alertness: Awake, asleep AEDs during EEG study: None Technical aspects: This EEG study was done with scalp electrodes positioned according to the 10-20 International system of electrode placement. Electrical activity was acquired at a sampling rate of 500Hz  and reviewed with a high frequency filter of 70Hz  and a low frequency filter of 1Hz . EEG data were recorded continuously and digitally stored. Description: No posterior dominant rhythm was seen. Sleep was characterized by vertex waves, sleep spindles (12 to 14  Hz), maximal frontocentral region. EEG showed continuous generalized polymorphic 3 to 6 Hz theta-delta slowing admixed 15 to 18 Hz beta activity with irregular morphology distributed symmetrically and diffusely. Hyperventilation and photic stimulation were not performed.   ABNORMALITY - Continuous slow, generalized - Excessive beta, generalized IMPRESSION: This study is suggestive of moderate diffuse encephalopathy, nonspecific etiology. No seizures or epileptiform discharges were seen throughout the recording. [place]     LOS: 1 day   Oren Binet, MD  [facility]    To contact the attending provider between 7A-7P or the covering provider during after hours 7P-7A, please log into the web site [email or web address] and access using universal  password for that web site. If you do not have the password, please call the hospital operator.  09/16/2021, 11:56 AM

## 2021-09-16 NOTE — Procedures (Signed)
Patient Name: [name]  MRN: [id number]  Epilepsy Attending: Lora Havens  Referring Physician/Provider: Dr Gean Birchwood Date:09/16/2021 Duration: 22.33 mins  Patient history: 50yo M with ams. EEG to evaluate for seizure  Level of alertness: Awake, asleep  AEDs during EEG study: None  Technical aspects: This EEG study was done with scalp electrodes positioned according to the 10-20 International system of electrode placement. Electrical activity was acquired at a sampling rate of 500Hz  and reviewed with a high frequency filter of 70Hz  and a low frequency filter of 1Hz . EEG data were recorded continuously and digitally stored.   Description: No posterior dominant rhythm was seen. Sleep was characterized by vertex waves, sleep spindles (12 to 14 Hz), maximal frontocentral region. EEG showed continuous generalized polymorphic 3 to 6 Hz theta-delta slowing admixed 15 to 18 Hz beta activity with irregular morphology distributed symmetrically and diffusely. Hyperventilation and photic stimulation were not performed.     ABNORMALITY - Continuous slow, generalized - Excessive beta, generalized  IMPRESSION: This study is suggestive of moderate diffuse encephalopathy, nonspecific etiology. No seizures or epileptiform discharges were seen throughout the recording.  [name] Barbra Sarks

## 2021-09-16 NOTE — Progress Notes (Signed)
EEG done at bedside. Results pending. No skin breakdown from EEG leads noted.

## 2021-09-16 NOTE — ED Notes (Signed)
Pt more alert spontaneously, asking to use urinal and for something to drink. Admitting hospitalist at bedside assessing pt.

## 2021-09-17 ENCOUNTER — Other Ambulatory Visit (HOSPITAL_COMMUNITY): Payer: Self-pay

## 2021-09-17 DIAGNOSIS — E1010 Type 1 diabetes mellitus with ketoacidosis without coma: Secondary | ICD-10-CM | POA: Diagnosis not present

## 2021-09-17 DIAGNOSIS — N179 Acute kidney failure, unspecified: Secondary | ICD-10-CM | POA: Diagnosis not present

## 2021-09-17 LAB — BASIC METABOLIC PANEL
Anion gap: 9 (ref 5–15)
BUN: 16 mg/dL (ref 6–20)
CO2: 26 mmol/L (ref 22–32)
Calcium: 9.3 mg/dL (ref 8.9–10.3)
Chloride: 102 mmol/L (ref 98–111)
Creatinine, Ser: 0.69 mg/dL (ref 0.61–1.24)
GFR, Estimated: >60 mL/min (ref >60)
Glucose, Bld: 250 mg/dL — ABNORMAL HIGH (ref 70–99)
Potassium: 3.3 mmol/L — ABNORMAL LOW (ref 3.5–5.1)
Sodium: 137 mmol/L (ref 135–145)

## 2021-09-17 LAB — CBC
HCT: 36.5 % — ABNORMAL LOW (ref 39.0–52.0)
Hemoglobin: 13.3 g/dL (ref 13.0–17.0)
MCH: 33.3 pg (ref 26.0–34.0)
MCHC: 36.4 g/dL — ABNORMAL HIGH (ref 30.0–36.0)
MCV: 91.3 fL (ref 80.0–100.0)
Platelets: 177 10*3/uL (ref 150–400)
RBC: 4 MIL/uL — ABNORMAL LOW (ref 4.22–5.81)
RDW: 13.3 % (ref 11.5–15.5)
WBC: 10.8 10*3/uL — ABNORMAL HIGH (ref 4.0–10.5)
nRBC: 0 % (ref 0.0–0.2)

## 2021-09-17 LAB — GLUCOSE, CAPILLARY
Glucose-Capillary: 343 mg/dL — ABNORMAL HIGH (ref 70–99)
Glucose-Capillary: 350 mg/dL — ABNORMAL HIGH (ref 70–99)
Glucose-Capillary: 291 mg/dL — ABNORMAL HIGH (ref 70–99)
Glucose-Capillary: 267 mg/dL — ABNORMAL HIGH (ref 70–99)
Glucose-Capillary: 294 mg/dL — ABNORMAL HIGH (ref 70–99)

## 2021-09-17 MED ORDER — METHOCARBAMOL 500 MG PO TABS
500.0000 mg | ORAL_TABLET | Freq: Three times a day (TID) | ORAL | Status: DC
  Filled 2021-09-17: qty 1

## 2021-09-17 MED ORDER — ATORVASTATIN CALCIUM 80 MG PO TABS
80.0000 mg | ORAL_TABLET | Freq: Every day | ORAL | Status: DC
  Administered 2021-09-17: 80 mg via ORAL
  Filled 2021-09-17: qty 1

## 2021-09-17 MED ORDER — LORAZEPAM 2 MG/ML IJ SOLN: 1.0000 mg | INTRAMUSCULAR | Status: DC | PRN

## 2021-09-17 MED ORDER — LORAZEPAM 1 MG PO TABS
1.0000 mg | ORAL_TABLET | ORAL | Status: DC | PRN
  Administered 2021-09-17: 2 mg via ORAL
  Filled 2021-09-17: qty 2

## 2021-09-17 MED ORDER — INSULIN ASPART 100 UNIT/ML IJ SOLN
7.0000 [IU] | Freq: Once | INTRAMUSCULAR | Status: AC
  Administered 2021-09-17: 7 [IU] via SUBCUTANEOUS

## 2021-09-17 MED ORDER — HYDRALAZINE HCL 20 MG/ML IJ SOLN
20.0000 mg | INTRAMUSCULAR | Status: DC | PRN
  Administered 2021-09-17: 20 mg via INTRAVENOUS
  Filled 2021-09-17: qty 1

## 2021-09-17 MED ORDER — THIAMINE HCL 100 MG/ML IJ SOLN: 100.0000 mg | Freq: Every day | INTRAMUSCULAR | Status: DC

## 2021-09-17 MED ORDER — INSULIN ASPART 100 UNIT/ML FLEXPEN
8.0000 [IU] | PEN_INJECTOR | Freq: Three times a day (TID) | SUBCUTANEOUS | 1 refills | Status: AC
  Filled 2021-09-17: qty 9, 37d supply, fill #0

## 2021-09-17 MED ORDER — ADULT MULTIVITAMIN W/MINERALS CH
1.0000 | ORAL_TABLET | Freq: Every day | ORAL | Status: DC
  Administered 2021-09-17: 1 via ORAL
  Filled 2021-09-17: qty 1

## 2021-09-17 MED ORDER — THIAMINE HCL 100 MG PO TABS
100.0000 mg | ORAL_TABLET | Freq: Every day | ORAL | Status: DC
  Administered 2021-09-17: 100 mg via ORAL
  Filled 2021-09-17: qty 1

## 2021-09-17 MED ORDER — AMLODIPINE BESYLATE 10 MG PO TABS
10.0000 mg | ORAL_TABLET | Freq: Every day | ORAL | Status: DC
  Administered 2021-09-17: 10 mg via ORAL
  Filled 2021-09-17: qty 1

## 2021-09-17 MED ORDER — INSULIN ASPART 100 UNIT/ML IJ SOLN
10.0000 [IU] | Freq: Once | INTRAMUSCULAR | Status: AC
  Administered 2021-09-17: 10 [IU] via SUBCUTANEOUS

## 2021-09-17 MED ORDER — INSULIN PEN NEEDLE 32G X 4 MM MISC
1.0000 | 0 refills | Status: AC | PRN
  Filled 2021-09-17: qty 100, 25d supply, fill #0

## 2021-09-17 MED ORDER — CARVEDILOL 6.25 MG PO TABS
6.2500 mg | ORAL_TABLET | Freq: Two times a day (BID) | ORAL | Status: DC
  Administered 2021-09-17: 6.25 mg via ORAL
  Filled 2021-09-17: qty 1

## 2021-09-17 MED ORDER — INSULIN GLARGINE-YFGN 100 UNIT/ML ~~LOC~~ SOLN
40.0000 [IU] | Freq: Every day | SUBCUTANEOUS | Status: DC
  Administered 2021-09-17: 40 [IU] via SUBCUTANEOUS
  Filled 2021-09-17: qty 0.4

## 2021-09-17 MED ORDER — BUPRENORPHINE HCL-NALOXONE HCL 8-2 MG SL SUBL
1.0000 | SUBLINGUAL_TABLET | Freq: Three times a day (TID) | SUBLINGUAL | Status: DC
  Administered 2021-09-17 (×2): 1 via SUBLINGUAL
  Filled 2021-09-17 (×2): qty 1

## 2021-09-17 MED ORDER — PANTOPRAZOLE SODIUM 40 MG PO TBEC
40.0000 mg | DELAYED_RELEASE_TABLET | Freq: Every day | ORAL | Status: DC
  Administered 2021-09-17: 40 mg via ORAL
  Filled 2021-09-17: qty 1

## 2021-09-17 MED ORDER — INSULIN ASPART 100 UNIT/ML IJ SOLN: 0.0000 [IU] | Freq: Three times a day (TID) | INTRAMUSCULAR | Status: DC

## 2021-09-17 MED ORDER — DULOXETINE HCL 60 MG PO CPEP
60.0000 mg | ORAL_CAPSULE | Freq: Two times a day (BID) | ORAL | Status: DC
  Administered 2021-09-17: 60 mg via ORAL
  Filled 2021-09-17: qty 1

## 2021-09-17 MED ORDER — INSULIN ASPART 100 UNIT/ML IJ SOLN
0.0000 [IU] | Freq: Three times a day (TID) | INTRAMUSCULAR | Status: DC
  Administered 2021-09-17: 8 [IU] via SUBCUTANEOUS

## 2021-09-17 MED ORDER — INSULIN GLARGINE 100 UNIT/ML SOLOSTAR PEN
45.0000 [IU] | PEN_INJECTOR | Freq: Every day | SUBCUTANEOUS | 1 refills | Status: AC
  Filled 2021-09-17: qty 15, 33d supply, fill #0

## 2021-09-17 MED ORDER — BLOOD GLUCOSE MONITOR SYSTEM W/DEVICE KIT
PACK | 0 refills | Status: AC
  Filled 2021-09-17: qty 1, 30d supply, fill #0

## 2021-09-17 MED ORDER — FREESTYLE LIBRE 2 SENSOR MISC
0 refills | Status: AC
  Filled 2021-09-17: qty 30, fill #0

## 2021-09-17 MED ORDER — INSULIN ASPART 100 UNIT/ML IJ SOLN: 6.0000 [IU] | Freq: Three times a day (TID) | INTRAMUSCULAR | Status: DC

## 2021-09-17 MED ORDER — BLOOD GLUCOSE MONITOR KIT: PACK | 0 refills | Status: AC

## 2021-09-17 MED ORDER — FOLIC ACID 1 MG PO TABS
1.0000 mg | ORAL_TABLET | Freq: Every day | ORAL | Status: DC
  Administered 2021-09-17: 1 mg via ORAL
  Filled 2021-09-17: qty 1

## 2021-09-17 MED ORDER — HYDROXYZINE HCL 25 MG PO TABS: 25.0000 mg | ORAL_TABLET | Freq: Two times a day (BID) | ORAL | Status: DC | PRN

## 2021-09-17 NOTE — Progress Notes (Addendum)
Informed by RN that CBG 343.  Ordering 7u novolog x1.  PRN hydralazine also added for BP (remains high despite labetalol PRN).  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  Adding back PTA suboxone as well as PRN ativan via CIWA protocol.  Additionally despite the 7u novolog at 0218 CBG still 350.  Will order 10u novolog x1 now.

## 2021-09-17 NOTE — Progress Notes (Addendum)
Inpatient Diabetes Program Recommendations  AACE/ADA: New Consensus Statement on Inpatient Glycemic Control (2015)  Target Ranges:  Prepandial:   less than 140 mg/dL      Peak postprandial:   less than 180 mg/dL (1-2 hours)      Critically ill patients:  140 - 180 mg/dL   Lab Results  Component Value Date   GLUCAP 267 (H) 09/17/2021   HGBA1C 9.5 (H) 09/15/2021    Review of Glycemic Control Results for [name], [name] (MRN [id number]) as of 09/17/2021 09:12  Ref. Range 09/16/2021 17:56 09/17/2021 02:22 09/17/2021 03:45 09/17/2021 06:44 09/17/2021 07:39  Glucose-Capillary Latest Ref Range: 70 - 99 mg/dL 241 (H) 343 (H) 350 (H) 291 (H) 267 (H)   Diabetes history: DM 1 Outpatient Diabetes medications:  Insulin pump/630G  ICR- 1 units/5 grams of CHO ISF- 1 units drops blood sugar 30 mg/dL Total basal insulin=48.7 units/24 hours Current orders for Inpatient glycemic control:  Semglee 30 units daily, Novolog 4 units daily, Novolog sensitive tid with meals  Inpatient Diabetes Program Recommendations:    Consider increasing Semglee to 45 units daily and increase meal coverage to 8 units tid with meals.   I don't recommend restarting insulin pump until patient see's his endocrinologist for adjustments.     Thanks,  Adah Perl, RN, BC-ADM Inpatient Diabetes Coordinator Pager [phone]  (8a-5p)  Addendum 11:05 AM- MD ordered application of Freestyle CGM at discharge for patient. Education done regarding application and changing CGM sensor (alternate every 14 days on back of arms), 1 hour warm-up, use of glucometer when alert displays, how to scan CGM for glucose reading and information for PCP. Patient has also been given educational packet regarding use CGM sensor including the 1-800 toll free number for any questions, problems or needs related to the [facility] sensors or reader.    Sensor applied by patient to (L) Arm at (11:15am).  Explained that glucose readings will not be  available until 1 hour after application. Reviewed use of CGM including how to scan, changing Sensor, Vitamin C warning, arrows with glucose readings, and Freestyle app.    Discussed insulin regimen with patient and reviewed use of insulin pen with patient as well to make sure he knows how to use.  Explained that Lantus is basal insulin and Novolog is to cover his food.  He states that on his pump he was bolusing about 15 units with meals.  Explained that dose ordered is less and that it will likely need adjustment by endocrinologist.  He is excited about use of Freestyle libre and states he applied for this last week with his doctor.

## 2021-09-17 NOTE — Progress Notes (Addendum)
CSW asked by RN for transportation help.  CSW spoke with pt in room, he was able to provide address, stated wife just had surgery and cannot pick him up.  CSW spoke with wife Evorn Gong who confirms this, says she does not have anyone who can pick pt up.  She confirmed address as well and will be there when pt arrives. Lurline Idol, MSW, LCSW 10/28/202211:04 AM   CSW spoke with RN, pt has been confused earlier, better now.  Will use taxi rather than [place]. [employer] provided. Lurline Idol, MSW, LCSW 10/28/202212:00 PM

## 2021-09-17 NOTE — Discharge Summary (Signed)
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 shortness of breath, life threatening emergency, suicidal or homicidal thoughts you must seek medical attention immediately by calling 911 or calling your MD immediately  if symptoms less severe.  You must read complete instructions/literature along with all the possible adverse reactions/side effects for all the Medicines you take and that have been prescribed to you. Take any new Medicines after you have completely understood and accpet all the possible adverse reactions/side effects.  Do not drive when taking Pain medications  or sleeping medications (Benzodaizepines)  Do not take more than prescribed Pain, Sleep and Anxiety Medications. It is not advisable to combine anxiety,sleep and pain medications without talking with your primary care practitioner  Special Instructions: If you have smoked or chewed Tobacco  in the last 2 yrs please stop smoking, stop any regular Alcohol  and or any Recreational drug use.  Wear Seat belts while driving.  Please note: You were cared for by a hospitalist during your hospital stay. Once you are discharged, your primary care physician will handle any further medical issues. Please note that NO REFILLS for any discharge medications will be authorized once you are discharged, as it is imperative that you return to your primary care physician (or establish a relationship with a primary care physician if you do not have one) for your post hospital discharge needs so that they can reassess your need for medications and monitor your lab values.  Total Time spent coordinating discharge including counseling, education and face to face time equals 35 minutes.  SignedOren Binet 09/17/2021 11:03 AM

## 2021-09-17 NOTE — Progress Notes (Addendum)
Patient dcd via wc to the main entrane then via taxi [employer] > home.Instructions also reviewed with patient. He verbalized understanding. He also confirmed his wife is at home when he arrives.

## 2021-09-21 ENCOUNTER — Telehealth: Payer: Self-pay

## 2021-09-21 LAB — CULTURE, BLOOD (ROUTINE X 2)
Culture: NO GROWTH
Culture: NO GROWTH
Special Requests: ADEQUATE
Special Requests: ADEQUATE

## 2021-09-21 NOTE — Telephone Encounter (Signed)
Dr. Charlett Blake, This patient showed up on the hospital discharge list & you are listed as his PCP but it looks like you have not seen him since Feb 2018. I spoke with him today & he has been seeing Helyn Numbers with [facility] but would like to start coming here again & would like to be seen here for his hospital follow up appt. I told him I would need to speak with you before making an appt since you have not seen him since 2018.

## 2021-09-21 NOTE — Telephone Encounter (Signed)
Transition Care Management Unsuccessful Follow-up Telephone Call  Date of discharge and from where:  09/17/2021-[place]  Attempts:  1st Attempt  Reason for unsuccessful TCM follow-up call:  No answer/busy

## 2021-09-22 ENCOUNTER — Ambulatory Visit: Payer: Self-pay | Admitting: Orthopedic Surgery

## 2021-09-28 ENCOUNTER — Inpatient Hospital Stay: Payer: Medicaid Other | Admitting: Family Medicine

## 2021-12-05 ENCOUNTER — Encounter (HOSPITAL_BASED_OUTPATIENT_CLINIC_OR_DEPARTMENT_OTHER): Payer: Self-pay | Admitting: Emergency Medicine

## 2021-12-05 ENCOUNTER — Emergency Department (HOSPITAL_BASED_OUTPATIENT_CLINIC_OR_DEPARTMENT_OTHER)
Admission: EM | Admit: 2021-12-05 | Discharge: 2021-12-05 | Disposition: A | Discharge status: Home / Self Care | Payer: Medicaid Other | Attending: Emergency Medicine | Admitting: Emergency Medicine

## 2021-12-05 ENCOUNTER — Emergency Department (HOSPITAL_BASED_OUTPATIENT_CLINIC_OR_DEPARTMENT_OTHER): Payer: Medicaid Other

## 2021-12-05 ENCOUNTER — Other Ambulatory Visit: Payer: Self-pay

## 2021-12-05 DIAGNOSIS — Z79899 Other long term (current) drug therapy: Secondary | ICD-10-CM | POA: Insufficient documentation

## 2021-12-05 DIAGNOSIS — W19XXXA Unspecified fall, initial encounter: Secondary | ICD-10-CM

## 2021-12-05 DIAGNOSIS — R519 Headache, unspecified: Secondary | ICD-10-CM | POA: Diagnosis present

## 2021-12-05 DIAGNOSIS — S0993XA Unspecified injury of face, initial encounter: Secondary | ICD-10-CM | POA: Insufficient documentation

## 2021-12-05 DIAGNOSIS — Z23 Encounter for immunization: Secondary | ICD-10-CM | POA: Diagnosis not present

## 2021-12-05 DIAGNOSIS — T148XXA Other injury of unspecified body region, initial encounter: Secondary | ICD-10-CM

## 2021-12-05 DIAGNOSIS — Z794 Long term (current) use of insulin: Secondary | ICD-10-CM | POA: Diagnosis not present

## 2021-12-05 DIAGNOSIS — R030 Elevated blood-pressure reading, without diagnosis of hypertension: Secondary | ICD-10-CM | POA: Diagnosis not present

## 2021-12-05 DIAGNOSIS — M898X8 Other specified disorders of bone, other site: Secondary | ICD-10-CM

## 2021-12-05 DIAGNOSIS — R52 Pain, unspecified: Secondary | ICD-10-CM

## 2021-12-05 DIAGNOSIS — G959 Disease of spinal cord, unspecified: Secondary | ICD-10-CM | POA: Insufficient documentation

## 2021-12-05 DIAGNOSIS — W108XXA Fall (on) (from) other stairs and steps, initial encounter: Secondary | ICD-10-CM | POA: Insufficient documentation

## 2021-12-05 DIAGNOSIS — S3991XA Unspecified injury of abdomen, initial encounter: Secondary | ICD-10-CM | POA: Diagnosis not present

## 2021-12-05 LAB — COMPREHENSIVE METABOLIC PANEL
ALT: 25 U/L (ref 0–44)
AST: 30 U/L (ref 15–41)
Albumin: 4.2 g/dL (ref 3.5–5.0)
Alkaline Phosphatase: 119 U/L (ref 38–126)
Anion gap: 12 (ref 5–15)
BUN: 15 mg/dL (ref 6–20)
CO2: 26 mmol/L (ref 22–32)
Calcium: 9.3 mg/dL (ref 8.9–10.3)
Chloride: 99 mmol/L (ref 98–111)
Creatinine, Ser: 0.81 mg/dL (ref 0.61–1.24)
GFR, Estimated: >60 mL/min (ref >60)
Glucose, Bld: 274 mg/dL — ABNORMAL HIGH (ref 70–99)
Potassium: 4 mmol/L (ref 3.5–5.1)
Sodium: 137 mmol/L (ref 135–145)
Total Bilirubin: 0.7 mg/dL (ref 0.3–1.2)
Total Protein: 8 g/dL (ref 6.5–8.1)

## 2021-12-05 LAB — CBC WITH DIFFERENTIAL/PLATELET
Abs Immature Granulocytes: 0.04 10*3/uL (ref 0.00–0.07)
Basophils Absolute: 0.1 10*3/uL (ref 0.0–0.1)
Basophils Relative: 1 %
Eosinophils Absolute: 0.1 10*3/uL (ref 0.0–0.5)
Eosinophils Relative: 1 %
HCT: 44 % (ref 39.0–52.0)
Hemoglobin: 15.8 g/dL (ref 13.0–17.0)
Immature Granulocytes: 0 %
Lymphocytes Relative: 22 %
Lymphs Abs: 2.3 10*3/uL (ref 0.7–4.0)
MCH: 32 pg (ref 26.0–34.0)
MCHC: 35.9 g/dL (ref 30.0–36.0)
MCV: 89.2 fL (ref 80.0–100.0)
Monocytes Absolute: 0.7 10*3/uL (ref 0.1–1.0)
Monocytes Relative: 7 %
Neutro Abs: 7.3 10*3/uL (ref 1.7–7.7)
Neutrophils Relative %: 69 %
Platelets: 240 10*3/uL (ref 150–400)
RBC: 4.93 MIL/uL (ref 4.22–5.81)
RDW: 12.9 % (ref 11.5–15.5)
WBC: 10.6 10*3/uL — ABNORMAL HIGH (ref 4.0–10.5)
nRBC: 0 % (ref 0.0–0.2)

## 2021-12-05 LAB — RAPID URINE DRUG SCREEN, HOSP PERFORMED
Amphetamines: NOT DETECTED
Barbiturates: NOT DETECTED
Benzodiazepines: POSITIVE — AB
Cocaine: NOT DETECTED
Opiates: POSITIVE — AB
Tetrahydrocannabinol: NOT DETECTED

## 2021-12-05 LAB — CK: Total CK: 302 U/L (ref 49–397)

## 2021-12-05 LAB — CBG MONITORING, ED
Glucose-Capillary: 55 mg/dL — ABNORMAL LOW (ref 70–99)
Glucose-Capillary: 61 mg/dL — ABNORMAL LOW (ref 70–99)

## 2021-12-05 LAB — LIPASE, BLOOD: Lipase: 20 U/L (ref 11–51)

## 2021-12-05 LAB — ETHANOL: Alcohol, Ethyl (B): <10 mg/dL (ref <10)

## 2021-12-05 MED ORDER — METHOCARBAMOL 500 MG PO TABS: 500.0000 mg | ORAL_TABLET | Freq: Two times a day (BID) | ORAL | 0 refills | Status: DC

## 2021-12-05 MED ORDER — IOHEXOL 300 MG/ML  SOLN
100.0000 mL | Freq: Once | INTRAMUSCULAR | Status: AC | PRN
  Administered 2021-12-05: 100 mL via INTRAVENOUS

## 2021-12-05 MED ORDER — MORPHINE SULFATE (PF) 4 MG/ML IV SOLN
4.0000 mg | Freq: Once | INTRAVENOUS | Status: AC
  Administered 2021-12-05: 4 mg via INTRAVENOUS
  Filled 2021-12-05: qty 1

## 2021-12-05 MED ORDER — MORPHINE SULFATE (PF) 4 MG/ML IV SOLN: 4.0000 mg | Freq: Once | INTRAVENOUS | Status: DC

## 2021-12-05 MED ORDER — LIDOCAINE 5 % EX PTCH
1.0000 | MEDICATED_PATCH | CUTANEOUS | 0 refills | Status: AC
Start: 2021-12-05 — End: ?

## 2021-12-05 MED ORDER — METHOCARBAMOL 500 MG PO TABS
500.0000 mg | ORAL_TABLET | Freq: Once | ORAL | Status: AC
Start: 2021-12-05 — End: 2021-12-05
  Administered 2021-12-05: 500 mg via ORAL
  Filled 2021-12-05: qty 1

## 2021-12-05 MED ORDER — SODIUM CHLORIDE 0.9 % IV BOLUS
1000.0000 mL | Freq: Once | INTRAVENOUS | Status: AC
  Administered 2021-12-05: 1000 mL via INTRAVENOUS

## 2021-12-05 MED ORDER — TETANUS-DIPHTH-ACELL PERTUSSIS 5-2.5-18.5 LF-MCG/0.5 IM SUSY
0.5000 mL | PREFILLED_SYRINGE | Freq: Once | INTRAMUSCULAR | Status: AC
  Administered 2021-12-05: 0.5 mL via INTRAMUSCULAR
  Filled 2021-12-05: qty 0.5

## 2021-12-05 MED ORDER — HYDROMORPHONE HCL 1 MG/ML IJ SOLN
0.5000 mg | Freq: Once | INTRAMUSCULAR | Status: AC
  Administered 2021-12-05: 0.5 mg via INTRAVENOUS
  Filled 2021-12-05: qty 1

## 2021-12-05 MED ORDER — ONDANSETRON HCL 4 MG/2ML IJ SOLN
4.0000 mg | Freq: Once | INTRAMUSCULAR | Status: AC
  Administered 2021-12-05: 4 mg via INTRAVENOUS
  Filled 2021-12-05: qty 2

## 2021-12-05 NOTE — ED Provider Notes (Signed)
[facility] EMERGENCY DEPARTMENT Provider Note   CSN: [id number] Arrival date & time: 12/05/21  0954     History  Chief Complaint  Patient presents with   [name]    [name] is a 51 y.o. male here for evaluation for mechanical fall down approximately 18 stairs yesterday evening.  He has difficulty following the events.  States he was walking down and slipped.  States he thinks he lost consciousness.  Did not feel "so bad" last night so he bed.  He has multiple abrasions, superficial lacerations.  Has traumatic injuries to his head, left flank, bruising to left knee, right hand, left shoulder.  C-collar applied in triage.  He denies any illicit substance use.  He denies any anticoagulation.  He denies any preceding symptoms of headache, chest pain, shortness of breath or weakness.  Admits to generalized headache, neck pain, back pain.  No anterior abdominal pain, weakness.  HPI     Home Medications Prior to Admission medications   Medication Sig Start Date End Date Taking? Authorizing Provider  lidocaine (LIDODERM) 5 % Place 1 patch onto the skin daily. Remove & Discard patch within 12 hours or as directed by MD 12/05/21  Yes Henderly, Britni A, PA-C  methocarbamol (ROBAXIN) 500 MG tablet Take 1 tablet (500 mg total) by mouth 2 (two) times daily. 12/05/21  Yes Henderly, Britni A, PA-C  albuterol (PROVENTIL HFA;VENTOLIN HFA) 108 (90 Base) MCG/ACT inhaler Inhale 2 puffs into the lungs every 6 (six) hours as needed for wheezing or shortness of breath. Patient not taking: No sig reported 01/10/17   [name], MD  Alcohol Swabs (SM ALCOHOL PREP) 70 % PADS APPLY 1 PAD TOPICALLY AS NEEDED UP TO 7 TIMES A DAY 08/14/17   Elayne Snare, MD  amLODipine (NORVASC) 10 MG tablet Take 1 tablet (10 mg total) by mouth daily. 03/29/18   Julianne Rice, MD  atorvastatin (LIPITOR) 80 MG tablet TAKE 1 TABLET (80 MG TOTAL) BY MOUTH DAILY. 04/20/17   [name], MD  BAYER MICROLET LANCETS  lancets USE AS DIRECTED TO CHECK BLOOD SUGAR 06/10/15   Brunetta Jeans, PA-C  blood glucose meter kit and supplies KIT Dispense based on patient and insurance preference. Use up to four times daily as directed. 09/17/21   Ghimire, Henreitta Leber, MD  Blood Glucose Monitoring Suppl (BLOOD GLUCOSE MONITOR SYSTEM) w/Device KIT Use to test blood sugars 4 times daily. 09/17/21   Ghimire, Henreitta Leber, MD  Blood Glucose Monitoring Suppl (ONE TOUCH ULTRA SYSTEM KIT) W/DEVICE KIT 1 kit by Does not apply route once. Patient taking differently: 1 kit by Other route See admin instructions. Check blood sugar 4-8 times daily. 06/21/12   Burnice Logan, MD  carvedilol (COREG) 6.25 MG tablet Take 6.25 mg by mouth 2 (two) times daily. 08/10/21   [provider]  Continuous Blood Gluc Sensor (FREESTYLE LIBRE 2 SENSOR) MISC Please use as instructed 09/17/21   Ghimire, Henreitta Leber, MD  CONTOUR NEXT TEST test strip CHECK BLOOD SUGAR 4-8 TIMES DAILY 06/05/17   [name], MD  diazepam (VALIUM) 5 MG tablet Take 1 tablet (5 mg total) by mouth 2 (two) times daily. 05/02/17   [name], MD  diclofenac sodium (VOLTAREN) 1 % GEL Apply a dime size portion on affected area for pain relief. 04/08/17   Fatima Blank, MD  DULoxetine (CYMBALTA) 60 MG capsule Take 60 mg by mouth 2 (two) times daily. 11/07/18  [provider]  [facility] ALCOHOL SWABS 70 % PADS Apply 1 each topically as needed. 06/24/16   Elayne Snare, MD  hydrOXYzine (ATARAX/VISTARIL) 25 MG tablet Take 25-50 mg by mouth every 12 (twelve) hours as needed for anxiety. 08/27/21   [provider]  insulin aspart (NOVOLOG) 100 UNIT/ML FlexPen Inject 8 Units into the skin 3 (three) times daily with meals. 09/17/21   Ghimire, Henreitta Leber, MD  insulin glargine (LANTUS) 100 UNIT/ML Solostar Pen Inject 45 Units into the skin daily at 10 pm. 09/17/21   Ghimire, Henreitta Leber, MD  Insulin Pen Needle 32G X 4 MM MISC Use to inject insulin 4 times daily. 09/17/21    Ghimire, Henreitta Leber, MD  Lancet Devices (BAYER MICROLET 2 LANCING DEVIC) MISC USE AS DIRECTED TO CHECK BLOOD SUGAR 11/09/15   Elayne Snare, MD  lisinopril (ZESTRIL) 20 MG tablet Take 30 mg by mouth daily. 06/25/21   [provider]  mupirocin ointment (BACTROBAN) 2 % Apply 1 application topically 2 (two) times daily. 06/13/21   [provider]  nicotine (NICODERM CQ - DOSED IN MG/24 HOURS) 21 mg/24hr patch Place 21 mg onto the skin daily. 09/01/21   [provider]  omeprazole (PRILOSEC) 40 MG capsule Take 40 mg by mouth daily. 09/10/21   [provider]  sildenafil (VIAGRA) 50 MG tablet Take 1/2 to 2 tablets by mouth as needed 12/26/16   [name], MD  SUBOXONE 8-2 MG FILM Place 1 Film under the tongue 3 (three) times daily. 09/08/21   [provider]  SUMAtriptan (IMITREX) 100 MG tablet TAKE 1 TABLET (100 MG TOTAL) BY MOUTH EVERY 2 (TWO) HOURS AS NEEDED. FOR HEADACHE    [name], MD  testosterone cypionate (DEPO-TESTOSTERONE) 100 MG/ML injection For IM use only,  inject 100 mg weekly in the buttock area with a 10 mL multidose vial 04/06/17   Elayne Snare, MD  traMADol (ULTRAM) 50 MG tablet TAKE 1 TABLET BY MOUTH THREE TIMES A DAY AS NEEDED FOR SEVERE PAIN 05/02/17   [name], MD      Allergies    Patient has no known allergies.    Review of Systems   Review of Systems  Constitutional: Negative.   HENT: Negative.    Respiratory: Negative.    Gastrointestinal:        Posterior left chest wall pain, flank pain  Genitourinary: Negative.   Musculoskeletal:  Positive for back pain and neck pain.  Skin:  Positive for wound.  Neurological:  Positive for headaches.  All other systems reviewed and are negative.  Physical Exam Updated Vital Signs BP (!) 174/122    Pulse 94    Temp 98.1 F (36.7 C) (Oral)    Resp 10    Ht 5' 10" (1.778 m)    Wt 100.7 kg    SpO2 93%    BMI 31.85 kg/m  Physical Exam Physical Exam  Constitutional: Pt is  oriented to person, place, and time. Appears well-developed and well-nourished. No distress.  HENT:  Head: Normocephalic.  Abrasion, superficial laceration posterior scalp, forehead Nose: Nose normal.  Mouth/Throat: Uvula is midline, oropharynx is clear and moist and mucous membranes are normal.  No loose dentition has dentures Eyes: Conjunctivae and EOM are normal. Pupils are equal, round, and reactive to light.  No Traumatic hyphema Neck: C-collar present, tenderness left paraspinal cervical region Cardiovascular: Normal rate, regular rhythm and intact distal pulses.   Pulses:      Radial  pulses are 2+ on the right side, and 2+ on the left side.       Dorsalis pedis pulses are 2+ on the right side, and 2+ on the left side.       Posterior tibial pulses are 2+ on the right side, and 2+ on the left side.  Pulmonary/Chest: Effort normal and breath sounds normal. No accessory muscle usage. No respiratory distress. No decreased breath sounds. No wheezes. No rhonchi. No rales.  Tenderness left posterior ribs No flail segment, crepitus or deformity Equal chest expansion  Abdominal: Soft. Normal appearance and bowel sounds are normal. There is no rigidity, no guarding and no CVA tenderness.  Large superficial laceration, erythema and tenderness left flank Abd soft  Musculoskeletal: Normal range of motion.       Thoracic back: Exhibits normal range of motion.       Lumbar back: Exhibits normal range of motion.  Full range of motion of the T-spine and L-spine Neurolyse midline tenderness thoracic, lumbar region No crepitus, deformity or step-offs Tenderness left midshaft humerus with overlying ecchymosis able to lift bilateral shoulders overhead.  Nontender left hand.  Right hand with overlying swelling, tenderness, full range of motion at wrist.  Nontender right forearm, humerus Pelvis stable, nontender palpation No shortening or rotation of legs Nontender bilateral femur.  Diffuse tenderness left  anterior knee, midshaft, proximal tib-fib on left Lymphadenopathy:    Pt has no cervical adenopathy.  Neurological: Pt is alert and oriented to person, place, and time. Normal reflexes. No cranial nerve deficit. GCS eye subscore is 4. GCS verbal subscore is 5. GCS motor subscore is 6.  Speech is clear and goal oriented, follows commands Normal 5/5 strength in upper and lower extremities bilaterally including dorsiflexion and plantar flexion, strong and equal grip strength Sensation normal to light and sharp touch Moves extremities without ataxia, coordination intact Normal gait and balance Skin: Skin is warm and dry. No rash noted. Pt is not diaphoretic. No erythema.  Psychiatric: Normal mood and affect.  Nursing note and vitals reviewed.  ED Results / Procedures / Treatments   Labs (all labs ordered are listed, but only abnormal results are displayed) Labs Reviewed  CBC WITH DIFFERENTIAL/PLATELET - Abnormal; Notable for the following components:      Result Value   WBC 10.6 (*)    All other components within normal limits  COMPREHENSIVE METABOLIC PANEL - Abnormal; Notable for the following components:   Glucose, Bld 274 (*)    All other components within normal limits  RAPID URINE DRUG SCREEN, HOSP PERFORMED - Abnormal; Notable for the following components:   Opiates POSITIVE (*)    Benzodiazepines POSITIVE (*)    All other components within normal limits  LIPASE, BLOOD  CK  ETHANOL  CBG MONITORING, ED    EKG None  Radiology DG Chest 2 View  Result Date: 12/05/2021 CLINICAL DATA:  Trauma, fall EXAM: CHEST - 2 VIEW COMPARISON:  09/15/2021 FINDINGS: The heart size and mediastinal contours are within normal limits. Both lungs are clear. The visualized skeletal structures are unremarkable. IMPRESSION: No active cardiopulmonary disease. Electronically Signed   By: Elmer Picker M.D.   On: 12/05/2021 12:08   DG Tibia/Fibula Left  Result Date: 12/05/2021 CLINICAL DATA:   Trauma, fall EXAM: LEFT TIBIA AND FIBULA - 2 VIEW COMPARISON:  None. FINDINGS: No recent fracture or dislocation is seen in the left tibia and fibula. Degenerative changes are noted with small bony spurs in the left knee. Bony spurs  seen in the dorsal aspect of talonavicular joint. IMPRESSION: No recent fracture or dislocation is seen in the left tibia and fibula. Electronically Signed   By: Elmer Picker M.D.   On: 12/05/2021 12:12   CT Head Wo Contrast  Result Date: 12/05/2021 CLINICAL DATA:  Injury.  Facial trauma. EXAM: CT HEAD WITHOUT CONTRAST CT MAXILLOFACIAL WITHOUT CONTRAST CT CERVICAL SPINE WITHOUT CONTRAST TECHNIQUE: Multidetector CT imaging of the head, cervical spine, and maxillofacial structures were performed using the standard protocol without intravenous contrast. Multiplanar CT image reconstructions of the cervical spine and maxillofacial structures were also generated. RADIATION DOSE REDUCTION: This exam was performed according to the departmental dose-optimization program which includes automated exposure control, adjustment of the mA and/or kV according to patient size and/or use of iterative reconstruction technique. COMPARISON:  09/15/2021. FINDINGS: CT HEAD FINDINGS Brain: No evidence of acute infarction, hemorrhage, hydrocephalus, extra-axial collection or mass lesion/mass effect. Vascular: No hyperdense vessel or unexpected calcification. Skull: Normal. Negative for fracture or focal lesion. Other: None. CT MAXILLOFACIAL FINDINGS Osseous: No fracture or mandibular dislocation. No destructive process. Orbits: Negative. No traumatic or inflammatory finding. Sinuses: Clear. Soft tissues: No obvious contusion.  No evidence of hematoma. CT CERVICAL SPINE FINDINGS Alignment: Normal. Skull base and vertebrae: No acute fracture. No primary bone lesion or focal pathologic process. Soft tissues and spinal canal: No prevertebral fluid or swelling. No visible canal hematoma. Disc levels: Discs  are well maintained in height. No disc bulging or evidence of a disc herniation. Central spinal canal and neural foramina are well preserved. Upper chest: Negative. Other: None IMPRESSION: HEAD CT 1. Normal. MAXILLOFACIAL CT 1. No fracture or acute finding. CERVICAL CT 1. Normal. Electronically Signed   By: Lajean Manes M.D.   On: 12/05/2021 11:40   CT Cervical Spine Wo Contrast  Result Date: 12/05/2021 CLINICAL DATA:  Injury.  Facial trauma. EXAM: CT HEAD WITHOUT CONTRAST CT MAXILLOFACIAL WITHOUT CONTRAST CT CERVICAL SPINE WITHOUT CONTRAST TECHNIQUE: Multidetector CT imaging of the head, cervical spine, and maxillofacial structures were performed using the standard protocol without intravenous contrast. Multiplanar CT image reconstructions of the cervical spine and maxillofacial structures were also generated. RADIATION DOSE REDUCTION: This exam was performed according to the departmental dose-optimization program which includes automated exposure control, adjustment of the mA and/or kV according to patient size and/or use of iterative reconstruction technique. COMPARISON:  09/15/2021. FINDINGS: CT HEAD FINDINGS Brain: No evidence of acute infarction, hemorrhage, hydrocephalus, extra-axial collection or mass lesion/mass effect. Vascular: No hyperdense vessel or unexpected calcification. Skull: Normal. Negative for fracture or focal lesion. Other: None. CT MAXILLOFACIAL FINDINGS Osseous: No fracture or mandibular dislocation. No destructive process. Orbits: Negative. No traumatic or inflammatory finding. Sinuses: Clear. Soft tissues: No obvious contusion.  No evidence of hematoma. CT CERVICAL SPINE FINDINGS Alignment: Normal. Skull base and vertebrae: No acute fracture. No primary bone lesion or focal pathologic process. Soft tissues and spinal canal: No prevertebral fluid or swelling. No visible canal hematoma. Disc levels: Discs are well maintained in height. No disc bulging or evidence of a disc herniation.  Central spinal canal and neural foramina are well preserved. Upper chest: Negative. Other: None IMPRESSION: HEAD CT 1. Normal. MAXILLOFACIAL CT 1. No fracture or acute finding. CERVICAL CT 1. Normal. Electronically Signed   By: Lajean Manes M.D.   On: 12/05/2021 11:40   CT CHEST ABDOMEN PELVIS W CONTRAST  Result Date: 12/05/2021 CLINICAL DATA:  Trauma, fall EXAM: CT CHEST, ABDOMEN, AND PELVIS WITH CONTRAST TECHNIQUE: Multidetector CT imaging  of the chest, abdomen and pelvis was performed following the standard protocol during bolus administration of intravenous contrast. RADIATION DOSE REDUCTION: This exam was performed according to the departmental dose-optimization program which includes automated exposure control, adjustment of the mA and/or kV according to patient size and/or use of iterative reconstruction technique. CONTRAST:  116m OMNIPAQUE IOHEXOL 300 MG/ML  SOLN COMPARISON:  Previous studies including the CT abdomen done on 04/06/2018 FINDINGS: CT CHEST FINDINGS Cardiovascular: Coronary artery calcifications are seen. There is no pericardial effusion. There is homogeneous enhancement in thoracic aorta. There are no filling defects in the central pulmonary artery branches. Mediastinum/Nodes: There is no evidence of mediastinal hematoma. There is slightly inhomogeneous attenuation and coarse calcification in the thyroid. Lungs/Pleura: There is no focal pulmonary consolidation. There is no pleural effusion or pneumothorax. Musculoskeletal: Unremarkable. CT ABDOMEN PELVIS FINDINGS Hepatobiliary: There is no demonstrable laceration. In image 58 of series 2, there is a faint 5 mm low-density in the right lobe. There is no dilation of bile ducts. Gallbladder is unremarkable. Pancreas: There is atrophy in the pancreas. No new focal abnormality is seen. Spleen: Unremarkable. Adrenals/Urinary Tract: There is mild nodularity in the left adrenal which appears stable. There is no hydronephrosis. There is no  demonstrable cortical laceration. There is no perinephric fluid collection. There are no demonstrable renal or ureteral stones. Urinary bladder is unremarkable. Stomach/Bowel: Stomach is not distended. Small bowel loops are not dilated. Appendix is not dilated. There is no pericecal inflammation. There is no significant wall thickening in colon. There is no pericolic stranding or fluid collection. Vascular/Lymphatic: Scattered arterial calcifications are seen. There is no active extravasation of contrast. There is no evidence of retroperitoneal hematoma. Reproductive: Unremarkable. Other: Bilateral inguinal hernias containing fat are seen. Small umbilical hernia containing fat is seen. There is no ascites or pneumoperitoneum. Musculoskeletal: No displaced fractures are seen. IMPRESSION: There is no evidence mediastinal hematoma. There is no focal pulmonary contusion. There is no pleural effusion or pneumothorax. There is no demonstrable laceration in the solid organs. There is no abnormal bowel wall thickening. There is no ascites or pneumoperitoneum. There is no evidence of retroperitoneal hematoma. No displaced fractures are seen. There is 5 mm ill-defined low-density in the right lobe of liver, possibly a cyst or hemangioma. This finding was not seen in the previous study. Follow-up CT in 3 months may be considered. Other findings as described in the body of the report. Electronically Signed   By: PElmer PickerM.D.   On: 12/05/2021 12:53   CT T-SPINE NO CHARGE  Result Date: 12/05/2021 CLINICAL DATA:  Trauma due to fall down stairs EXAM: CT Thoracic and Lumbar spine with contrast TECHNIQUE: Multiplanar CT images of the thoracic and lumbar spine were reconstructed from contemporary CT of the Chest, Abdomen, and Pelvis. RADIATION DOSE REDUCTION: This exam was performed according to the departmental dose-optimization program which includes automated exposure control, adjustment of the mA and/or kV  according to patient size and/or use of iterative reconstruction technique. CONTRAST:  None additional COMPARISON:  None available FINDINGS: CT THORACIC SPINE FINDINGS Alignment: No traumatic malalignment Vertebrae: No acute fracture or focal pathologic process. Paraspinal and other soft tissues: Negative. Disc levels: Lower thoracic spondylosis.  No visible ureter. Dorsal epidural fat expansion with dorsal epidural mass measuring 2.1 cm in length the T7-8 level. The thecal sac is displaced anteriorly CT LUMBAR SPINE FINDINGS Segmentation: 5 lumbar type vertebrae Alignment: Normal Vertebrae: No acute fracture or focal pathologic process. Paraspinal and other soft tissues:  Reported separately Disc levels: No degenerative impingement. IMPRESSION: 1. 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 2. No evidence of thoracic or lumbar spine injury. Electronically Signed   By: Jorje Guild M.D.   On: 12/05/2021 12:43   CT L-SPINE NO CHARGE  Result Date: 12/05/2021 CLINICAL DATA:  Trauma due to fall down stairs EXAM: CT Thoracic and Lumbar spine with contrast TECHNIQUE: Multiplanar CT images of the thoracic and lumbar spine were reconstructed from contemporary CT of the Chest, Abdomen, and Pelvis. RADIATION DOSE REDUCTION: This exam was performed according to the departmental dose-optimization program which includes automated exposure control, adjustment of the mA and/or kV according to patient size and/or use of iterative reconstruction technique. CONTRAST:  None additional COMPARISON:  None available FINDINGS: CT THORACIC SPINE FINDINGS Alignment: No traumatic malalignment Vertebrae: No acute fracture or focal pathologic process. Paraspinal and other soft tissues: Negative. Disc levels: Lower thoracic spondylosis.  No visible ureter. Dorsal epidural fat expansion with dorsal epidural mass measuring 2.1 cm in length the T7-8 level. The thecal  sac is displaced anteriorly CT LUMBAR SPINE FINDINGS Segmentation: 5 lumbar type vertebrae Alignment: Normal Vertebrae: No acute fracture or focal pathologic process. Paraspinal and other soft tissues: Reported separately Disc levels: No degenerative impingement. IMPRESSION: 1. 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 2. No evidence of thoracic or lumbar spine injury. Electronically Signed   By: Jorje Guild M.D.   On: 12/05/2021 12:43   DG Knee Complete 4 Views Right  Result Date: 12/05/2021 CLINICAL DATA:  Trauma, fall EXAM: RIGHT KNEE - COMPLETE 4+ VIEW COMPARISON:  None. FINDINGS: No recent fracture or dislocation is seen. Deformity in the proximal shaft of right fibula may be residual from previous injury. There is no significant effusion. Minimal bony spurs seen in the patella. IMPRESSION: No recent fracture or dislocation is seen in the right knee. Electronically Signed   By: Elmer Picker M.D.   On: 12/05/2021 12:13   DG Humerus Left  Result Date: 12/05/2021 CLINICAL DATA:  Trauma, fall EXAM: LEFT HUMERUS - 2+ VIEW COMPARISON:  None. FINDINGS: There is no evidence of fracture or other focal bone lesions. Soft tissues are unremarkable. IMPRESSION: No fracture or dislocation is seen in the left humerus. Electronically Signed   By: Elmer Picker M.D.   On: 12/05/2021 12:10   DG Hand Complete Right  Result Date: 12/05/2021 CLINICAL DATA:  Trauma, fall EXAM: RIGHT HAND - COMPLETE 3+ VIEW COMPARISON:  None. FINDINGS: There is no evidence of fracture or dislocation. Minimal bony spurs seen in the interphalangeal joints. Soft tissues are unremarkable. IMPRESSION: No fracture or dislocation is seen in the right hand. Electronically Signed   By: Elmer Picker M.D.   On: 12/05/2021 12:10   CT Maxillofacial Wo Contrast  Result Date: 12/05/2021 CLINICAL DATA:  Injury.  Facial trauma. EXAM: CT HEAD WITHOUT  CONTRAST CT MAXILLOFACIAL WITHOUT CONTRAST CT CERVICAL SPINE WITHOUT CONTRAST TECHNIQUE: Multidetector CT imaging of the head, cervical spine, and maxillofacial structures were performed using the standard protocol without intravenous contrast. Multiplanar CT image reconstructions of the cervical spine and maxillofacial structures were also generated. RADIATION DOSE REDUCTION: This exam was performed according to the departmental dose-optimization program which includes automated exposure control, adjustment of the mA and/or kV according to patient size and/or use of iterative reconstruction technique. COMPARISON:  09/15/2021. FINDINGS: CT HEAD FINDINGS Brain: No evidence  of acute infarction, hemorrhage, hydrocephalus, extra-axial collection or mass lesion/mass effect. Vascular: No hyperdense vessel or unexpected calcification. Skull: Normal. Negative for fracture or focal lesion. Other: None. CT MAXILLOFACIAL FINDINGS Osseous: No fracture or mandibular dislocation. No destructive process. Orbits: Negative. No traumatic or inflammatory finding. Sinuses: Clear. Soft tissues: No obvious contusion.  No evidence of hematoma. CT CERVICAL SPINE FINDINGS Alignment: Normal. Skull base and vertebrae: No acute fracture. No primary bone lesion or focal pathologic process. Soft tissues and spinal canal: No prevertebral fluid or swelling. No visible canal hematoma. Disc levels: Discs are well maintained in height. No disc bulging or evidence of a disc herniation. Central spinal canal and neural foramina are well preserved. Upper chest: Negative. Other: None IMPRESSION: HEAD CT 1. Normal. MAXILLOFACIAL CT 1. No fracture or acute finding. CERVICAL CT 1. Normal. Electronically Signed   By: Lajean Manes M.D.   On: 12/05/2021 11:40    Procedures Procedures    Medications Ordered in ED Medications  Tdap (BOOSTRIX) injection 0.5 mL (0.5 mLs Intramuscular Given 12/05/21 1059)  ondansetron (ZOFRAN) injection 4 mg (4 mg  Intravenous Given 12/05/21 1054)  morphine 4 MG/ML injection 4 mg (4 mg Intravenous Given 12/05/21 1055)  sodium chloride 0.9 % bolus 1,000 mL (1,000 mLs Intravenous New Bag/Given 12/05/21 1053)  iohexol (OMNIPAQUE) 300 MG/ML solution 100 mL (100 mLs Intravenous Contrast Given 12/05/21 1206)  HYDROmorphone (DILAUDID) injection 0.5 mg (0.5 mg Intravenous Given 12/05/21 1343)  methocarbamol (ROBAXIN) tablet 500 mg (500 mg Oral Given 12/05/21 1345)    ED Course/ Medical Decision Making/ A&P    51 year old here for evaluation after mechanical fall down approximately 15-18 stairs yesterday evening.  He does not necessarily recall the entire events however denies any preceding headache, syncope, chest pain, shortness of breath.  On arrival he has multiple areas of traumatic injuries, superficial lacerations, contusions, abrasions to chest wall, flank, scalp, left humerus, left tib-fib, knee and right hand.  Reassuring he is neurovascularly intact.  Plan on labs, imaging, reassess  Labs and imaging personally reviewed and interpreted:  CBC leukocytosis 08.6 Metabolic panel glucose 578, getting IV fluids, normal anion gap, CO2, low suspicion for DKA, HHS Lipase 20 CK 302 Ethanol less than 10 CT head/ max face/cervical>> No acute findings CT thoracic/lumbar with spine mass, rec outpatient MR and Neurosurgery referral, no infectious symptoms, low suspicion for infectious process CTC/A/P without acute findings  Patient reassessed.  Discussed lab and imaging work-up.  We will follow-up outpatient with neurosurgery.  With regards to his injuries today thankfully no acute findings aside from some abrasions, superficial lacerations which do not need suturing.  He is ambulatory here, neurovascularly intact without any deficits.  He has had some hypertension however patient states this is his baseline.  He did not take his blood pressure medications this morning, I offered to give them to him here today however he  declined and is requesting discharge home.  I have low suspicion for hypertensive urgency or emergency.  I did request that he takes his blood pressure medications when he gets home.  Will DC home with symptomatic management for his pain.  Encourage close follow-up outpatient with PCP./ NRSGY  Patient agreeable.  The patient has been appropriately medically screened and/or stabilized in the ED. I have low suspicion for any other emergent medical condition which would require further screening, evaluation or treatment in the ED or require inpatient management.  Patient is hemodynamically stable and in no acute distress.  Patient able to ambulate  in department prior to ED.  Evaluation does not show acute pathology that would require ongoing or additional emergent interventions while in the emergency department or further inpatient treatment.  I have discussed the diagnosis with the patient and answered all questions.  Pain is been managed while in the emergency department and patient has no further complaints prior to discharge.  Patient is comfortable with plan discussed in room and is stable for discharge at this time.  I have discussed strict return precautions for returning to the emergency department.  Patient was encouraged to follow-up with PCP/specialist refer to at discharge.                           Medical Decision Making Amount and/or Complexity of Data Reviewed Independent Historian:     Details: family External Data Reviewed: labs, radiology and notes. Labs: ordered. Decision-making details documented in ED Course. Radiology: ordered and independent interpretation performed. Decision-making details documented in ED Course.  Risk OTC drugs. Prescription drug management. Parenteral controlled substances.          Final Clinical Impression(s) / ED Diagnoses Final diagnoses:  Fall, initial encounter  Mass of spine  Elevated blood pressure reading  Abrasion    Rx / DC  Orders ED Discharge Orders          Ordered    methocarbamol (ROBAXIN) 500 MG tablet  2 times daily        12/05/21 1408    lidocaine (LIDODERM) 5 %  Every 24 hours        12/05/21 1408              Henderly, Britni A, PA-C 12/05/21 1413    Malvin Johns, MD 12/05/21 1457

## 2021-12-05 NOTE — ED Notes (Signed)
Pt sts his BP "is always like this"; sts he takes 3 BP medications as directed

## 2021-12-05 NOTE — ED Notes (Signed)
Gave pt an orange juice and two packs of crackers for hypoglycemia. PA informed. Pt okay for discharge once repeat CBG rises.

## 2021-12-05 NOTE — ED Notes (Signed)
Assisted to pt to use urinal; pt was able to sit on side of bed w/o assistance to use urinal

## 2021-12-05 NOTE — ED Triage Notes (Signed)
Pt arrives pov, to triage in wheelchair, reports fall last night, endorses etoh last night. Pt endorses difficulty with recalling events. Pt reports fall down 15 stairs, loc, scratch abrasions noted to head, abrasions to back, right hand swelling. Pt c/o bilateral rib pain, and knee pain. C collar applied in triage

## 2021-12-05 NOTE — Discharge Instructions (Signed)
As discussed in the room your CT scan showed a possible mass on your spine.  You need to follow-up with the neurosurgeon for this.  I have listed their phone number in your discharge paperwork.  Call to schedule an appointment.

## 2021-12-05 NOTE — ED Notes (Signed)
Pt left prior to CBG recheck

## 2022-01-08 ENCOUNTER — Emergency Department (HOSPITAL_BASED_OUTPATIENT_CLINIC_OR_DEPARTMENT_OTHER): Payer: Medicaid Other

## 2022-01-08 ENCOUNTER — Other Ambulatory Visit: Payer: Self-pay

## 2022-01-08 ENCOUNTER — Encounter (HOSPITAL_BASED_OUTPATIENT_CLINIC_OR_DEPARTMENT_OTHER): Payer: Self-pay | Admitting: Emergency Medicine

## 2022-01-08 ENCOUNTER — Observation Stay (HOSPITAL_BASED_OUTPATIENT_CLINIC_OR_DEPARTMENT_OTHER)
Admission: EM | Admit: 2022-01-08 | Discharge: 2022-01-10 | Discharge status: Against Medical Advice | Payer: Medicaid Other | Attending: Internal Medicine | Admitting: Internal Medicine

## 2022-01-08 DIAGNOSIS — F17210 Nicotine dependence, cigarettes, uncomplicated: Secondary | ICD-10-CM | POA: Diagnosis not present

## 2022-01-08 DIAGNOSIS — Z79899 Other long term (current) drug therapy: Secondary | ICD-10-CM | POA: Insufficient documentation

## 2022-01-08 DIAGNOSIS — E119 Type 2 diabetes mellitus without complications: Secondary | ICD-10-CM

## 2022-01-08 DIAGNOSIS — E1165 Type 2 diabetes mellitus with hyperglycemia: Secondary | ICD-10-CM | POA: Diagnosis not present

## 2022-01-08 DIAGNOSIS — Z20822 Contact with and (suspected) exposure to covid-19: Secondary | ICD-10-CM | POA: Diagnosis not present

## 2022-01-08 DIAGNOSIS — G9340 Encephalopathy, unspecified: Secondary | ICD-10-CM | POA: Diagnosis present

## 2022-01-08 DIAGNOSIS — R4701 Aphasia: Secondary | ICD-10-CM | POA: Diagnosis present

## 2022-01-08 DIAGNOSIS — Z794 Long term (current) use of insulin: Secondary | ICD-10-CM | POA: Diagnosis not present

## 2022-01-08 DIAGNOSIS — R4182 Altered mental status, unspecified: Secondary | ICD-10-CM

## 2022-01-08 DIAGNOSIS — E785 Hyperlipidemia, unspecified: Secondary | ICD-10-CM | POA: Diagnosis present

## 2022-01-08 DIAGNOSIS — I10 Essential (primary) hypertension: Secondary | ICD-10-CM | POA: Insufficient documentation

## 2022-01-08 DIAGNOSIS — G9341 Metabolic encephalopathy: Principal | ICD-10-CM | POA: Insufficient documentation

## 2022-01-08 DIAGNOSIS — R739 Hyperglycemia, unspecified: Secondary | ICD-10-CM

## 2022-01-08 DIAGNOSIS — F419 Anxiety disorder, unspecified: Secondary | ICD-10-CM

## 2022-01-08 DIAGNOSIS — M549 Dorsalgia, unspecified: Secondary | ICD-10-CM | POA: Diagnosis present

## 2022-01-08 LAB — CBG MONITORING, ED
Glucose-Capillary: 346 mg/dL — ABNORMAL HIGH (ref 70–99)
Glucose-Capillary: 442 mg/dL — ABNORMAL HIGH (ref 70–99)
Glucose-Capillary: 279 mg/dL — ABNORMAL HIGH (ref 70–99)
Glucose-Capillary: 295 mg/dL — ABNORMAL HIGH (ref 70–99)

## 2022-01-08 LAB — BASIC METABOLIC PANEL
Anion gap: 11 (ref 5–15)
BUN: 14 mg/dL (ref 6–20)
CO2: 22 mmol/L (ref 22–32)
Calcium: 9 mg/dL (ref 8.9–10.3)
Chloride: 100 mmol/L (ref 98–111)
Creatinine, Ser: 0.87 mg/dL (ref 0.61–1.24)
GFR, Estimated: >60 mL/min (ref >60)
Glucose, Bld: 432 mg/dL — ABNORMAL HIGH (ref 70–99)
Potassium: 3.6 mmol/L (ref 3.5–5.1)
Sodium: 133 mmol/L — ABNORMAL LOW (ref 135–145)

## 2022-01-08 LAB — URINALYSIS, ROUTINE W REFLEX MICROSCOPIC
Bilirubin Urine: NEGATIVE
Glucose, UA: >=500 mg/dL — AB
Hgb urine dipstick: NEGATIVE
Ketones, ur: NEGATIVE mg/dL
Leukocytes,Ua: NEGATIVE
Nitrite: NEGATIVE
Protein, ur: NEGATIVE mg/dL
Specific Gravity, Urine: 1.015 (ref 1.005–1.030)
pH: 6 (ref 5.0–8.0)

## 2022-01-08 LAB — CBC
HCT: 37.6 % — ABNORMAL LOW (ref 39.0–52.0)
Hemoglobin: 13.6 g/dL (ref 13.0–17.0)
MCH: 32.6 pg (ref 26.0–34.0)
MCHC: 36.2 g/dL — ABNORMAL HIGH (ref 30.0–36.0)
MCV: 90.2 fL (ref 80.0–100.0)
Platelets: 186 10*3/uL (ref 150–400)
RBC: 4.17 MIL/uL — ABNORMAL LOW (ref 4.22–5.81)
RDW: 12.5 % (ref 11.5–15.5)
WBC: 7.1 10*3/uL (ref 4.0–10.5)
nRBC: 0 % (ref 0.0–0.2)

## 2022-01-08 LAB — I-STAT VENOUS BLOOD GAS, ED
Acid-Base Excess: 1 mmol/L (ref 0.0–2.0)
Bicarbonate: 25.2 mmol/L (ref 20.0–28.0)
Calcium, Ion: 1.21 mmol/L (ref 1.15–1.40)
HCT: 37 % — ABNORMAL LOW (ref 39.0–52.0)
Hemoglobin: 12.6 g/dL — ABNORMAL LOW (ref 13.0–17.0)
O2 Saturation: 92 %
Patient temperature: 98.2
Potassium: 3.7 mmol/L (ref 3.5–5.1)
Sodium: 138 mmol/L (ref 135–145)
TCO2: 26 mmol/L (ref 22–32)
pCO2, Ven: 38.2 mmHg — ABNORMAL LOW (ref 44–60)
pH, Ven: 7.426 (ref 7.25–7.43)
pO2, Ven: 61 mmHg — ABNORMAL HIGH (ref 32–45)

## 2022-01-08 LAB — RESP PANEL BY RT-PCR (FLU A&B, COVID) ARPGX2
Influenza A by PCR: NEGATIVE
Influenza B by PCR: NEGATIVE
SARS Coronavirus 2 by RT PCR: NEGATIVE

## 2022-01-08 LAB — URINALYSIS, MICROSCOPIC (REFLEX)

## 2022-01-08 LAB — ETHANOL: Alcohol, Ethyl (B): 12 mg/dL — ABNORMAL HIGH (ref <10)

## 2022-01-08 MED ORDER — BUPRENORPHINE HCL-NALOXONE HCL 8-2 MG SL SUBL: 1.0000 | SUBLINGUAL_TABLET | Freq: Every day | SUBLINGUAL | Status: DC

## 2022-01-08 MED ORDER — SODIUM CHLORIDE 0.9 % IV BOLUS
1000.0000 mL | Freq: Once | INTRAVENOUS | Status: AC
  Administered 2022-01-08: 1000 mL via INTRAVENOUS

## 2022-01-08 MED ORDER — SODIUM CHLORIDE 0.9 % IV SOLN: INTRAVENOUS | Status: DC

## 2022-01-08 NOTE — ED Triage Notes (Addendum)
Pt/wife report slurred speech since 1500, difficulty with memory/confusion since ~ Thursday. Also reports high blood sugars at home 500s-600s. Last reading at home around 2030 was >600. Pt states he took 70 units of Humalog to treat it at home. 30 units, followed by another 10 units and then 30 units when he "didn't see it come down fast enough". EDP called to triage. CBG 442 in triage. Pt appears confused, fidgety, and SHOB.

## 2022-01-08 NOTE — ED Notes (Addendum)
Pt states he has had 70 units of insulin over the last ~hour. He states he took 30 units, then 10, then another 30.

## 2022-01-08 NOTE — ED Provider Notes (Addendum)
[facility] Provider Note   CSN: [id number] Arrival date & time: 01/08/22  2134     History  Chief Complaint  Patient presents with   Aphasia    [name] is a 51 y.o. male.  Patient according to spouse has had some mental confusion since Thursday Friday for sure.  Patient is a type I diabetic.  Used to have an insulin pump.  But that is been disconnected.  Patient states he has been having trouble with sugar control.  Presented here with kind of slurred speech.  A little difficulty finding words.  No other focal neurodeficit.  Denies any visual changes.  Denies any upper extremity lower extremity weakness.  Patient's spouse says that the speech problems have been off probably since Thursday.  Usually this is all secondary to him being in DKA.  Where he gets his altered mental status and has speech issues.  Last admitted in October.  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  They deny any fever or any abdominal pain or nausea or vomiting.  Patient tachypneic.  Oxygen saturation is 98% on room air.  Heart rate 89.  Patient not febrile.  Blood pressure is actually elevated now at 171/95.  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  Still drinks some alcohol.  Did have a couple beers earlier today.        Home Medications Prior to Admission medications   Medication Sig Start Date End Date Taking? Authorizing Provider  albuterol (PROVENTIL HFA;VENTOLIN HFA) 108 (90 Base) MCG/ACT inhaler Inhale 2 puffs into the lungs every 6 (six) hours as needed for wheezing or shortness of breath. Patient not taking: No sig reported 01/10/17   [name], MD  Alcohol Swabs (SM ALCOHOL PREP) 70 % PADS APPLY 1 PAD TOPICALLY AS NEEDED UP TO 7 TIMES A DAY 08/14/17   Elayne Snare, MD  amLODipine (NORVASC) 10 MG tablet Take 1 tablet (10 mg total) by mouth daily. 03/29/18   Julianne Rice, MD  atorvastatin (LIPITOR) 80 MG  tablet TAKE 1 TABLET (80 MG TOTAL) BY MOUTH DAILY. 04/20/17   [name], MD  BAYER MICROLET LANCETS lancets USE AS DIRECTED TO CHECK BLOOD SUGAR 06/10/15   Brunetta Jeans, PA-C  blood glucose meter kit and supplies KIT Dispense based on patient and insurance preference. Use up to four times daily as directed. 09/17/21   Ghimire, Henreitta Leber, MD  Blood Glucose Monitoring Suppl (BLOOD GLUCOSE MONITOR SYSTEM) w/Device KIT Use to test blood sugars 4 times daily. 09/17/21   Ghimire, Henreitta Leber, MD  Blood Glucose Monitoring Suppl (ONE TOUCH ULTRA SYSTEM KIT) W/DEVICE KIT 1 kit by Does not apply route once. Patient taking differently: 1 kit by Other route See admin instructions. Check blood sugar 4-8 times daily. 06/21/12   Burnice Logan, MD  carvedilol (COREG) 6.25 MG tablet Take 6.25 mg by mouth 2 (two) times daily. 08/10/21   [provider]  Continuous Blood Gluc Sensor (FREESTYLE LIBRE 2 SENSOR) MISC Please use as instructed 09/17/21   Ghimire, Henreitta Leber, MD  CONTOUR NEXT TEST test strip CHECK BLOOD SUGAR 4-8 TIMES DAILY 06/05/17   [name], MD  diazepam (VALIUM) 5 MG tablet Take 1 tablet (5 mg total) by mouth 2 (two) times daily. 05/02/17   [name], MD  diclofenac sodium (VOLTAREN) 1 % GEL Apply a dime size portion on  affected area for pain relief. 04/08/17   Fatima Blank, MD  DULoxetine (CYMBALTA) 60 MG capsule Take 60 mg by mouth 2 (two) times daily. 11/07/18   [provider]  GNP ALCOHOL SWABS 70 % PADS Apply 1 each topically as needed. 06/24/16   Elayne Snare, MD  hydrOXYzine (ATARAX/VISTARIL) 25 MG tablet Take 25-50 mg by mouth every 12 (twelve) hours as needed for anxiety. 08/27/21   [provider]  insulin aspart (NOVOLOG) 100 UNIT/ML FlexPen Inject 8 Units into the skin 3 (three) times daily with meals. 09/17/21   Ghimire, Henreitta Leber, MD  insulin glargine (LANTUS) 100 UNIT/ML Solostar Pen Inject 45 Units into the skin daily at 10 pm. 09/17/21    Ghimire, Henreitta Leber, MD  Insulin Pen Needle 32G X 4 MM MISC Use to inject insulin 4 times daily. 09/17/21   Ghimire, Henreitta Leber, MD  Lancet Devices (BAYER MICROLET 2 LANCING DEVIC) MISC USE AS DIRECTED TO CHECK BLOOD SUGAR 11/09/15   Elayne Snare, MD  lidocaine (LIDODERM) 5 % Place 1 patch onto the skin daily. Remove & Discard patch within 12 hours or as directed by MD 12/05/21   Henderly, Britni A, PA-C  lisinopril (ZESTRIL) 20 MG tablet Take 30 mg by mouth daily. 06/25/21   [provider]  methocarbamol (ROBAXIN) 500 MG tablet Take 1 tablet (500 mg total) by mouth 2 (two) times daily. 12/05/21   Henderly, Britni A, PA-C  mupirocin ointment (BACTROBAN) 2 % Apply 1 application topically 2 (two) times daily. 06/13/21   [provider]  nicotine (NICODERM CQ - DOSED IN MG/24 HOURS) 21 mg/24hr patch Place 21 mg onto the skin daily. 09/01/21   [provider]  omeprazole (PRILOSEC) 40 MG capsule Take 40 mg by mouth daily. 09/10/21   [provider]  sildenafil (VIAGRA) 50 MG tablet Take 1/2 to 2 tablets by mouth as needed 12/26/16   [name], MD  SUBOXONE 8-2 MG FILM Place 1 Film under the tongue 3 (three) times daily. 09/08/21   [provider]  SUMAtriptan (IMITREX) 100 MG tablet TAKE 1 TABLET (100 MG TOTAL) BY MOUTH EVERY 2 (TWO) HOURS AS NEEDED. FOR HEADACHE    [name], MD  testosterone cypionate (DEPO-TESTOSTERONE) 100 MG/ML injection For IM use only,  inject 100 mg weekly in the buttock area with a 10 mL multidose vial 04/06/17   Elayne Snare, MD  traMADol (ULTRAM) 50 MG tablet TAKE 1 TABLET BY MOUTH THREE TIMES A DAY AS NEEDED FOR SEVERE PAIN 05/02/17   [name], MD      Allergies    Patient has no known allergies.    Review of Systems   Review of Systems  Constitutional:  Negative for chills and fever.  HENT:  Negative for ear pain and sore throat.   Eyes:  Negative for pain and visual disturbance.  Respiratory:  Negative for cough  and shortness of breath.   Cardiovascular:  Negative for chest pain and palpitations.  Gastrointestinal:  Negative for abdominal pain and vomiting.  Genitourinary:  Negative for dysuria and hematuria.  Musculoskeletal:  Positive for myalgias. Negative for arthralgias and back pain.  Skin:  Negative for color change and rash.  Neurological:  Positive for speech difficulty. Negative for seizures, syncope, facial asymmetry, weakness, numbness and headaches.  All other systems reviewed and are negative.  Physical Exam Updated Vital Signs BP (!) 171/95    Pulse 89    Temp 98.2 F (36.8  C) (Oral)    Resp 17    Ht 1.778 m (_0 )    Wt 100.7 kg    SpO2 97%    BMI 31.85 kg/m  Physical Exam Vitals and nursing note reviewed.  Constitutional:      General: He is in acute distress.     Appearance: He is well-developed.  HENT:     Head: Normocephalic and atraumatic.     Mouth/Throat:     Mouth: Mucous membranes are moist.  Eyes:     Extraocular Movements: Extraocular movements intact.     Conjunctiva/sclera: Conjunctivae normal.     Pupils: Pupils are equal, round, and reactive to light.  Cardiovascular:     Rate and Rhythm: Normal rate and regular rhythm.     Heart sounds: No murmur heard. Pulmonary:     Effort: Pulmonary effort is normal. No respiratory distress.     Breath sounds: Normal breath sounds.  Abdominal:     Palpations: Abdomen is soft.     Tenderness: There is no abdominal tenderness.  Musculoskeletal:        General: No swelling.     Cervical back: Neck supple.  Skin:    General: Skin is warm and dry.     Capillary Refill: Capillary refill takes less than 2 seconds.  Neurological:     Mental Status: He is alert.     Cranial Nerves: Cranial nerve deficit present.     Comments: Patient with difficulty finding words.  But then other times speaks pretty good.  Speech a little bit slurred.  No lower extremity weakness no upper extremity weakness.  Eyes track properly tongue  moves well.  Facial weakness.  Psychiatric:        Mood and Affect: Mood normal.    ED Results / Procedures / Treatments   Labs (all labs ordered are listed, but only abnormal results are displayed) Labs Reviewed  BASIC METABOLIC PANEL - Abnormal; Notable for the following components:      Result Value   Sodium 133 (*)    Glucose, Bld 432 (*)    All other components within normal limits  CBC - Abnormal; Notable for the following components:   RBC 4.17 (*)    HCT 37.6 (*)    MCHC 36.2 (*)    All other components within normal limits  URINALYSIS, ROUTINE W REFLEX MICROSCOPIC - Abnormal; Notable for the following components:   Glucose, UA >=500 (*)    All other components within normal limits  URINALYSIS, MICROSCOPIC (REFLEX) - Abnormal; Notable for the following components:   Bacteria, UA RARE (*)    All other components within normal limits  CBG MONITORING, ED - Abnormal; Notable for the following components:   Glucose-Capillary 442 (*)    All other components within normal limits  CBG MONITORING, ED - Abnormal; Notable for the following components:   Glucose-Capillary 346 (*)    All other components within normal limits  I-STAT VENOUS BLOOD GAS, ED - Abnormal; Notable for the following components:   pCO2, Ven 38.2 (*)    pO2, Ven 61 (*)    HCT 37.0 (*)    Hemoglobin 12.6 (*)    All other components within normal limits  CBG MONITORING, ED - Abnormal; Notable for the following components:   Glucose-Capillary 295 (*)    All other components within normal limits  CBG MONITORING, ED - Abnormal; Notable for the following components:   Glucose-Capillary 279 (*)    All other  components within normal limits  RESP PANEL BY RT-PCR (FLU A&B, COVID) ARPGX2  BLOOD GAS, VENOUS  HEPATIC FUNCTION PANEL  ETHANOL  RAPID URINE DRUG SCREEN, HOSP PERFORMED  AMMONIA    EKG EKG Interpretation  Date/Time:  Saturday January 08 2022 22:22:11 EST Ventricular Rate:  86 PR  Interval:  200 QRS Duration: 109 QT Interval:  389 QTC Calculation: 466 R Axis:   39 Text Interpretation: Sinus rhythm Abnormal R-wave progression, early transition Abnormal inferior Q waves No significant change was found Confirmed by Fredia Sorrow [phone]) on 01/08/2022 10:25:53 PM  Radiology CT Head Wo Contrast  Result Date: 01/08/2022 CLINICAL DATA:  Slurred speech and memory difficulties, initial encounter EXAM: CT HEAD WITHOUT CONTRAST TECHNIQUE: Contiguous axial images were obtained from the base of the skull through the vertex without intravenous contrast. RADIATION DOSE REDUCTION: This exam was performed according to the departmental dose-optimization program which includes automated exposure control, adjustment of the mA and/or kV according to patient size and/or use of iterative reconstruction technique. COMPARISON:  12/05/2021 FINDINGS: Brain: No evidence of acute infarction, hemorrhage, hydrocephalus, extra-axial collection or mass lesion/mass effect. Vascular: No hyperdense vessel or unexpected calcification. Skull: Normal. Negative for fracture or focal lesion. Sinuses/Orbits: No acute finding. Other: None. IMPRESSION: No acute intracranial abnormality noted. Electronically Signed   By: Inez Catalina M.D.   On: 01/08/2022 22:50   DG Chest Port 1 View  Result Date: 01/08/2022 CLINICAL DATA:  Altered mental status and slurred speech EXAM: PORTABLE CHEST 1 VIEW COMPARISON:  12/05/2021 FINDINGS: The heart size and mediastinal contours are within normal limits. Both lungs are clear. The visualized skeletal structures are unremarkable. IMPRESSION: No active disease. Electronically Signed   By: Inez Catalina M.D.   On: 01/08/2022 22:58    Procedures Procedures    Medications Ordered in ED Medications  0.9 %  sodium chloride infusion ( Intravenous New Bag/Given 01/08/22 2222)  sodium chloride 0.9 % bolus 1,000 mL (1,000 mLs Intravenous New Bag/Given 01/08/22 2224)    ED Course/ Medical  Decision Making/ A&P                           Medical Decision Making Amount and/or Complexity of Data Reviewed Labs: ordered. Radiology: ordered.  Risk Prescription drug management. Decision regarding hospitalization.   CRITICAL CARE Performed by: Fredia Sorrow Total critical care time: 45 minutes Critical care time was exclusive of separately billable procedures and treating other patients. Critical care was necessary to treat or prevent imminent or life-threatening deterioration. Critical care was time spent personally by me on the following activities: development of treatment plan with patient and/or surrogate as well as nursing, discussions with consultants, evaluation of patient's response to treatment, examination of patient, obtaining history from patient or surrogate, ordering and performing treatments and interventions, ordering and review of laboratory studies, ordering and review of radiographic studies, pulse oximetry and re-evaluation of patient's condition.  Patient's blood sugar upon arrival was 495.  Patient states that it was 600 something at home.  He did give himself some additional insulins 70 units of insulin over the last hour.  Blood sugar did come down on its own to around 279.  I have to follow the blood sugars carefully based on that amount of insulin.  Patient is not in DKA.  Venous blood gas pH is normal.  pH was 7.4 PCO2 38 PO2 61 on the venous blood gas.  Urinalysis without acute infection.  COVID influenza  negative.  Basic metabolic panel is potassium 3.6 sodium down a little bit at 133 that is a pseudohyponatremia based on the blood sugar.  Renal function was normal.  No leukocytosis hemoglobin 13.6.  Chest x-ray negative.  Patient not tachycardic not hypoxic.  Head CT without any acute findings.  Since not in DKA broaden things out to check liver function test.  Alcohol urine drug screen and ammonia level.  We will give patient his Suboxone here.  Feel  that patient needs to be admitted for the hyperglycemia better blood sugar control.  And monitoring to make sure the blood sugar does not drop too much.  And also most likely will need MRI for this altered mental status.  Patient was admitted for very similar presentation in October.  At that time did appear to be in DKA.  Had altered mental status at that time.  Chest with the hospitalist.  They will admit for better sugar control although he is down to 279 now.  And will do evaluation for the altered mental status.    Final Clinical Impression(s) / ED Diagnoses Final diagnoses:  Hyperglycemia  Altered mental status, unspecified altered mental status type    Rx / DC Orders ED Discharge Orders     None         Fredia Sorrow, MD 01/08/22 2349    Fredia Sorrow, MD 01/08/22 2350    Fredia Sorrow, MD 01/08/22 2356

## 2022-01-09 ENCOUNTER — Encounter (HOSPITAL_BASED_OUTPATIENT_CLINIC_OR_DEPARTMENT_OTHER): Payer: Self-pay | Admitting: Internal Medicine

## 2022-01-09 DIAGNOSIS — Z79899 Other long term (current) drug therapy: Secondary | ICD-10-CM | POA: Diagnosis not present

## 2022-01-09 DIAGNOSIS — F17210 Nicotine dependence, cigarettes, uncomplicated: Secondary | ICD-10-CM | POA: Diagnosis not present

## 2022-01-09 DIAGNOSIS — Z794 Long term (current) use of insulin: Secondary | ICD-10-CM | POA: Diagnosis not present

## 2022-01-09 DIAGNOSIS — F419 Anxiety disorder, unspecified: Secondary | ICD-10-CM

## 2022-01-09 DIAGNOSIS — G9340 Encephalopathy, unspecified: Secondary | ICD-10-CM

## 2022-01-09 DIAGNOSIS — I10 Essential (primary) hypertension: Secondary | ICD-10-CM | POA: Diagnosis not present

## 2022-01-09 DIAGNOSIS — E119 Type 2 diabetes mellitus without complications: Secondary | ICD-10-CM

## 2022-01-09 DIAGNOSIS — E1165 Type 2 diabetes mellitus with hyperglycemia: Secondary | ICD-10-CM | POA: Diagnosis not present

## 2022-01-09 DIAGNOSIS — G9341 Metabolic encephalopathy: Secondary | ICD-10-CM | POA: Diagnosis not present

## 2022-01-09 DIAGNOSIS — Z20822 Contact with and (suspected) exposure to covid-19: Secondary | ICD-10-CM | POA: Diagnosis not present

## 2022-01-09 DIAGNOSIS — R4701 Aphasia: Secondary | ICD-10-CM | POA: Diagnosis present

## 2022-01-09 LAB — CBC WITH DIFFERENTIAL/PLATELET
Abs Immature Granulocytes: 0.02 10*3/uL (ref 0.00–0.07)
Basophils Absolute: 0.1 10*3/uL (ref 0.0–0.1)
Basophils Relative: 1 %
Eosinophils Absolute: 0.2 10*3/uL (ref 0.0–0.5)
Eosinophils Relative: 3 %
HCT: 36.6 % — ABNORMAL LOW (ref 39.0–52.0)
Hemoglobin: 12.9 g/dL — ABNORMAL LOW (ref 13.0–17.0)
Immature Granulocytes: 0 %
Lymphocytes Relative: 34 %
Lymphs Abs: 2 10*3/uL (ref 0.7–4.0)
MCH: 32.6 pg (ref 26.0–34.0)
MCHC: 35.2 g/dL (ref 30.0–36.0)
MCV: 92.4 fL (ref 80.0–100.0)
Monocytes Absolute: 0.4 10*3/uL (ref 0.1–1.0)
Monocytes Relative: 7 %
Neutro Abs: 3.2 10*3/uL (ref 1.7–7.7)
Neutrophils Relative %: 55 %
Platelets: 158 10*3/uL (ref 150–400)
RBC: 3.96 MIL/uL — ABNORMAL LOW (ref 4.22–5.81)
RDW: 12.7 % (ref 11.5–15.5)
WBC: 5.8 10*3/uL (ref 4.0–10.5)
nRBC: 0 % (ref 0.0–0.2)

## 2022-01-09 LAB — COMPREHENSIVE METABOLIC PANEL
ALT: 20 U/L (ref 0–44)
AST: 18 U/L (ref 15–41)
Albumin: 3.7 g/dL (ref 3.5–5.0)
Alkaline Phosphatase: 81 U/L (ref 38–126)
Anion gap: 8 (ref 5–15)
BUN: 10 mg/dL (ref 6–20)
CO2: 28 mmol/L (ref 22–32)
Calcium: 8.8 mg/dL — ABNORMAL LOW (ref 8.9–10.3)
Chloride: 102 mmol/L (ref 98–111)
Creatinine, Ser: 0.71 mg/dL (ref 0.61–1.24)
GFR, Estimated: >60 mL/min (ref >60)
Glucose, Bld: 313 mg/dL — ABNORMAL HIGH (ref 70–99)
Potassium: 3.7 mmol/L (ref 3.5–5.1)
Sodium: 138 mmol/L (ref 135–145)
Total Bilirubin: 0.4 mg/dL (ref 0.3–1.2)
Total Protein: 6.4 g/dL — ABNORMAL LOW (ref 6.5–8.1)

## 2022-01-09 LAB — RAPID URINE DRUG SCREEN, HOSP PERFORMED
Amphetamines: NOT DETECTED
Barbiturates: NOT DETECTED
Benzodiazepines: NOT DETECTED
Cocaine: NOT DETECTED
Opiates: NOT DETECTED
Tetrahydrocannabinol: NOT DETECTED

## 2022-01-09 LAB — CBG MONITORING, ED
Glucose-Capillary: 149 mg/dL — ABNORMAL HIGH (ref 70–99)
Glucose-Capillary: 54 mg/dL — ABNORMAL LOW (ref 70–99)
Glucose-Capillary: 60 mg/dL — ABNORMAL LOW (ref 70–99)
Glucose-Capillary: 142 mg/dL — ABNORMAL HIGH (ref 70–99)

## 2022-01-09 LAB — HEPATIC FUNCTION PANEL
ALT: 19 U/L (ref 0–44)
AST: 19 U/L (ref 15–41)
Albumin: 3.1 g/dL — ABNORMAL LOW (ref 3.5–5.0)
Alkaline Phosphatase: 85 U/L (ref 38–126)
Bilirubin, Direct: 0.1 mg/dL (ref 0.0–0.2)
Indirect Bilirubin: 0.3 mg/dL (ref 0.3–0.9)
Total Bilirubin: 0.4 mg/dL (ref 0.3–1.2)
Total Protein: 6 g/dL — ABNORMAL LOW (ref 6.5–8.1)

## 2022-01-09 LAB — HEMOGLOBIN A1C
Hgb A1c MFr Bld: 7.9 % — ABNORMAL HIGH (ref 4.8–5.6)
Mean Plasma Glucose: 180.03 mg/dL

## 2022-01-09 LAB — HIV ANTIBODY (ROUTINE TESTING W REFLEX): HIV Screen 4th Generation wRfx: NONREACTIVE

## 2022-01-09 LAB — GLUCOSE, CAPILLARY
Glucose-Capillary: 312 mg/dL — ABNORMAL HIGH (ref 70–99)
Glucose-Capillary: 230 mg/dL — ABNORMAL HIGH (ref 70–99)

## 2022-01-09 LAB — MAGNESIUM: Magnesium: 1.7 mg/dL (ref 1.7–2.4)

## 2022-01-09 LAB — AMMONIA: Ammonia: 49 umol/L — ABNORMAL HIGH (ref 9–35)

## 2022-01-09 MED ORDER — ATORVASTATIN CALCIUM 40 MG PO TABS
80.0000 mg | ORAL_TABLET | Freq: Every day | ORAL | Status: DC
Start: 2022-01-09 — End: 2022-01-10
  Administered 2022-01-09: 80 mg via ORAL
  Filled 2022-01-09: qty 2

## 2022-01-09 MED ORDER — INSULIN ASPART 100 UNIT/ML IJ SOLN
0.0000 [IU] | Freq: Every day | INTRAMUSCULAR | Status: DC
  Administered 2022-01-09: 2 [IU] via SUBCUTANEOUS

## 2022-01-09 MED ORDER — ACETAMINOPHEN 325 MG PO TABS: 650.0000 mg | ORAL_TABLET | Freq: Four times a day (QID) | ORAL | Status: DC | PRN

## 2022-01-09 MED ORDER — PANTOPRAZOLE SODIUM 40 MG PO TBEC
40.0000 mg | DELAYED_RELEASE_TABLET | Freq: Every day | ORAL | Status: DC
Start: 2022-01-09 — End: 2022-01-10
  Administered 2022-01-09: 40 mg via ORAL
  Filled 2022-01-09: qty 1

## 2022-01-09 MED ORDER — ACETAMINOPHEN 650 MG RE SUPP: 650.0000 mg | Freq: Four times a day (QID) | RECTAL | Status: DC | PRN

## 2022-01-09 MED ORDER — AMLODIPINE BESYLATE 10 MG PO TABS
10.0000 mg | ORAL_TABLET | Freq: Every day | ORAL | Status: DC
  Administered 2022-01-09: 10 mg via ORAL
  Filled 2022-01-09: qty 1

## 2022-01-09 MED ORDER — LIDOCAINE 5 % EX PTCH
1.0000 | MEDICATED_PATCH | CUTANEOUS | Status: DC
  Administered 2022-01-09: 1 via TRANSDERMAL
  Filled 2022-01-09: qty 1

## 2022-01-09 MED ORDER — BUPRENORPHINE HCL-NALOXONE HCL 8-2 MG SL SUBL: 1.0000 | SUBLINGUAL_TABLET | Freq: Three times a day (TID) | SUBLINGUAL | Status: DC

## 2022-01-09 MED ORDER — INSULIN ASPART 100 UNIT/ML IJ SOLN
0.0000 [IU] | Freq: Three times a day (TID) | INTRAMUSCULAR | Status: DC
  Administered 2022-01-09: 11 [IU] via SUBCUTANEOUS

## 2022-01-09 MED ORDER — BUPRENORPHINE HCL-NALOXONE HCL 8-2 MG SL SUBL
1.0000 | SUBLINGUAL_TABLET | Freq: Three times a day (TID) | SUBLINGUAL | Status: DC
Start: 2022-01-09 — End: 2022-01-10
  Administered 2022-01-09 (×2): 1 via SUBLINGUAL
  Filled 2022-01-09 (×2): qty 1

## 2022-01-09 MED ORDER — CLONIDINE HCL 0.1 MG PO TABS
0.1000 mg | ORAL_TABLET | Freq: Once | ORAL | Status: AC | PRN
  Administered 2022-01-09: 0.1 mg via ORAL
  Filled 2022-01-09: qty 1

## 2022-01-09 MED ORDER — CARVEDILOL 12.5 MG PO TABS
12.5000 mg | ORAL_TABLET | Freq: Two times a day (BID) | ORAL | Status: DC
Start: 2022-01-09 — End: 2022-01-10
  Administered 2022-01-09: 12.5 mg via ORAL
  Filled 2022-01-09: qty 1

## 2022-01-09 MED ORDER — ALBUTEROL SULFATE (2.5 MG/3ML) 0.083% IN NEBU: 2.5000 mg | INHALATION_SOLUTION | Freq: Four times a day (QID) | RESPIRATORY_TRACT | Status: DC | PRN

## 2022-01-09 MED ORDER — DULOXETINE HCL 60 MG PO CPEP
60.0000 mg | ORAL_CAPSULE | Freq: Two times a day (BID) | ORAL | Status: DC
  Administered 2022-01-09: 60 mg via ORAL
  Filled 2022-01-09: qty 1

## 2022-01-09 MED ORDER — HYDRALAZINE HCL 20 MG/ML IJ SOLN
10.0000 mg | Freq: Three times a day (TID) | INTRAMUSCULAR | Status: DC | PRN
  Administered 2022-01-09: 10 mg via INTRAVENOUS
  Filled 2022-01-09: qty 1

## 2022-01-09 MED ORDER — BUPRENORPHINE HCL-NALOXONE HCL 8-2 MG SL SUBL
1.0000 | SUBLINGUAL_TABLET | Freq: Every day | SUBLINGUAL | Status: DC
  Administered 2022-01-09: 1 via SUBLINGUAL
  Filled 2022-01-09: qty 1

## 2022-01-09 MED ORDER — LISINOPRIL 10 MG PO TABS
20.0000 mg | ORAL_TABLET | Freq: Once | ORAL | Status: AC
  Administered 2022-01-09: 20 mg via ORAL
  Filled 2022-01-09: qty 2

## 2022-01-09 MED ORDER — LISINOPRIL 20 MG PO TABS: 20.0000 mg | ORAL_TABLET | Freq: Every day | ORAL | Status: DC

## 2022-01-09 NOTE — ED Provider Notes (Signed)
°  Physical Exam  BP (!) 187/114    Pulse 77    Temp 98.2 F (36.8 C) (Oral)    Resp 11    Ht 5\' 10"  (1.778 m)    Wt 100.7 kg    SpO2 96%    BMI 31.85 kg/m   Physical Exam  Procedures  Procedures  ED Course / MDM    Medical Decision Making Amount and/or Complexity of Data Reviewed Labs: ordered. Radiology: ordered.  Risk Prescription drug management. Decision regarding hospitalization.    Patient pending transfer found to be hypertensive.  Reviewing records it appears if home medicines had not been started.  Also had not had his second dose of Suboxone today.  Do not think he needs emergent reduction of his blood pressure.  Will give him his lisinopril now and he is due another dose of Suboxone.  Appears stable for transfer to [facility]       Davonna Belling, MD 01/09/22 [phone]

## 2022-01-09 NOTE — Progress Notes (Signed)
Pt was due to go for MRI but pt and spouse informed RN pt is claustrophobic with pt stating "I am extremely claustrophobic and has to be unconscious. Ativan not going to work for me". Dr. Marylyn Ishihara notified and per MD anesthesiology to evaluate pt tomorrow and to hold off MRI for today. MRI notified. Delia Heady RN

## 2022-01-09 NOTE — ED Notes (Signed)
Lying quietly on stretcher, on cont cardiac monitoring, awaiting room assignment at [facility]. VS stable at this time, follows commands without hesitation, speech appropriate at this time. Requested more juice, provided OJ and noted to being taking in POs without difficulty. Will cont to monitor and will reck CBG shortly after OJ intake
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
## 2022-01-09 NOTE — ED Notes (Signed)
Patient alert and able to answer questions at this time.  States when "my sugar drops I normally sit up and eat something."  Patient provided with apple juice and crackers.  No difficulty with swallowing noted.

## 2022-01-09 NOTE — ED Notes (Signed)
Alert and oriented, ambulates to washroom without assistance. Acting appropriately, speech and swallow WNL, BEFAST/ VAN remains Negative. Cont to await for room assignment

## 2022-01-09 NOTE — Progress Notes (Signed)
Pt admitted from [facility] via stretcher by carelink. Report received from [place]. Pt Alert and verbally responsive. VSS; telemetry applied and verified with CCMD per protocol; pt oriented to the unit and room; spouse at bedside; skin assessment completed with second RN per protocol; no pressure ulcers or opened wound noted except scabbed abrasion to BLE. Prn hydralazine adm for elevated BP and MD at bedside. Will continue to closely monitor. Delia Heady RN    01/09/22 1458  Vitals  Temp 97.9 F (36.6 C)  Temp Source Oral  BP (!) 191/116  MAP (mmHg) 136  BP Method Automatic  Pulse Rate 82  Pulse Rate Source Monitor  Resp 20  MEWS COLOR  MEWS Score Color Green  Oxygen Therapy  SpO2 97 %  O2 Device Room Air  MEWS Score  MEWS Temp 0  MEWS Systolic 0  MEWS Pulse 0  MEWS RR 0  MEWS LOC 0  MEWS Score 0

## 2022-01-10 NOTE — Progress Notes (Signed)
Pt left AMA.... sts he has not been able to sleep at all d/t alarms going off every time he gets up to void urine  Had prev called nurse sec to see if we could cancel his bed alarms since he was A&O x4 and wife at bedside.   Pt educating given but he still refused... wife tried to convince him to stay but he started to take off his cords and pulling out his PIV... was able to d/c his PIV and secure it w/tape  Notified Lovey Newcomer NP, nurse sec and charge RN.   Pt signed AMA forms and left the floor w/wife.

## 2022-01-10 NOTE — Discharge Summary (Signed)
AMA Discharge  On call notified by nursing that patient had left AMA. Per nursing note: "Pt left AMA.... sts he has not been able to sleep at all d/t alarms going off every time he gets up to void urine  Had prev called nurse sec to see if we could cancel his bed alarms since he was A&O x4 and wife at bedside.   Pt educating given but he still refused... wife tried to convince him to stay but he started to take off his cords and pulling out his PIV... was able to d/c his PIV and secure it w/tape  Notified Lovey Newcomer NP, nurse sec and charge RN.   Pt signed AMA forms and left the floor w/wife."  HPI: [name] is a 51 y.o. male with medical history significant of chronic pain, GERD, HTN, HLD, DM2. Presenting with altered mental status. His wife reports that he's been lethargic for the better part of a week. She notes that he started flexeril for back pain the week before. His confusion seems to have progressed yesterday. She notes that he glucose was reading "high" on his glucometer. He decided to take 70 units of humalog. He checked his glucose again later and it was still reading high. She also noted that he was having slurred speech. She became concerned that his confusion may be due to DKA. So she brought him to the ED for evaluation.  During interview patient was more alert. He was eating [employer] and seemed generally in good spirits. He agreed to treatment plan presented at that time.   Final Dx Acute metabolic encephalopathy DM2 uncontrolled HTN Chronic pain Anxiety HLD   [name], DO

## 2022-01-19 ENCOUNTER — Other Ambulatory Visit: Payer: Self-pay | Admitting: Neurosurgery

## 2022-02-01 NOTE — Progress Notes (Addendum)
Surgical Instructions ? ? ? Your procedure is scheduled on 02/04/22. ? Report to [facility] Main Entrance "A" at 11:15 A.M., then check in with the Admitting office. ? Call this number if you have problems the morning of surgery: ? [phone] ? ? If you have any questions prior to your surgery date call [phone]: Open Monday-Friday 8am-4pm ? ? ? Remember: ? Do not eat or drink after midnight the night before your surgery ? ?  ? Take these medicines the morning of surgery with A SIP OF WATER:  ?carvedilol (COREG)  ?DULoxetine (CYMBALTA) ?haloperidol (HALDOL) ?hydrOXYzine (ATARAX) ?omeprazole (PRILOSEC)  ? ?IF NEEDED: ?Carboxymethylcellul-Glycerin (LUBRICATING EYE DROPS OP) ? ?As of today, STOP taking any Aspirin (unless otherwise instructed by your surgeon) Aleve, Naproxen, Ibuprofen, Motrin, Advil, Goody's, BC's, all herbal medications, fish oil, and all vitamins. ? ?Please stop taking SUBOXONE 3 days prior to surgery unless the prescribing doctor has told you differently.  Your last dose will be 02/04/22 if your prescribing doctor has not told you otherwise.  ? ? ?WHAT DO I DO ABOUT MY DIABETES MEDICATION? ? ? ?Do not take oral diabetes medicines (pills) the morning of surgery. ? ?THE NIGHT BEFORE SURGERY, take 36 units of insulin glargine (LANTUS).    ? ? ?The day of surgery, do not take other diabetes injectables, including Byetta (exenatide), Bydureon (exenatide ER), Victoza (liraglutide), or Trulicity (dulaglutide). ? ?THE MORNING OF SURGERY, if your CBG is greater than 220 mg/dL, you may take ? of your sliding scale (insulin aspart (NOVOLOG)) dose of insulin. ? ? ?HOW TO MANAGE YOUR DIABETES ?BEFORE AND AFTER SURGERY ? ?Why is it important to control my blood sugar before and after surgery? ?Improving blood sugar levels before and after surgery helps healing and can limit problems. ?A way of improving blood sugar control is eating a healthy diet by: ? Eating less sugar and carbohydrates ? Increasing  activity/exercise ? Talking with your doctor about reaching your blood sugar goals ?High blood sugars (greater than 180 mg/dL) can raise your risk of infections and slow your recovery, so you will need to focus on controlling your diabetes during the weeks before surgery. ?Make sure that the doctor who takes care of your diabetes knows about your planned surgery including the date and location. ? ?How do I manage my blood sugar before surgery? ?Check your blood sugar at least 4 times a day, starting 2 days before surgery, to make sure that the level is not too high or low. ? ?Check your blood sugar the morning of your surgery when you wake up and every 2 hours until you get to the Short Stay unit. ? ?If your blood sugar is less than 70 mg/dL, you will need to treat for low blood sugar: ?Do not take insulin. ?Treat a low blood sugar (less than 70 mg/dL) with ? cup of clear juice (cranberry or apple), 4 glucose tablets, OR glucose gel. ?Recheck blood sugar in 15 minutes after treatment (to make sure it is greater than 70 mg/dL). If your blood sugar is not greater than 70 mg/dL on recheck, call [phone] for further instructions. ?Report your blood sugar to the short stay nurse when you get to Short Stay. ? ?If you are admitted to the hospital after surgery: ?Your blood sugar will be checked by the staff and you will probably be given insulin after surgery (instead of oral diabetes medicines) to make sure you have good blood sugar levels. ?The goal for blood sugar control after  surgery is 80-180 mg/dL. ? ?     ?Do not wear jewelry or makeup ?Do not wear lotions, powders, perfumes/colognes, or deodorant. ?Do not shave 48 hours prior to surgery.  Men may shave face and neck. ?Do not bring valuables to the hospital. ?Do not wear nail polish, gel polish, artificial nails, or any other type of covering on natural nails (fingers and toes) ?If you have artificial nails or gel coating that need to be removed by a nail  salon, please have this removed prior to surgery. Artificial nails or gel coating may interfere with anesthesia's ability to adequately monitor your vital signs. ? ?[facility] is not responsible for any belongings or valuables. .  ? ?Do NOT Smoke (Tobacco/Vaping)  24 hours prior to your procedure ? ?If you use a CPAP at night, you may bring your mask for your overnight stay. ?  ?Contacts, glasses, hearing aids, dentures or partials may not be worn into surgery, please bring cases for these belongings ?  ?For patients admitted to the hospital, discharge time will be determined by your treatment team. ?  ?Patients discharged the day of surgery will not be allowed to drive home, and someone needs to stay with them for 24 hours. ? ?NO VISITORS WILL BE ALLOWED IN PRE-OP WHERE PATIENTS ARE PREPPED FOR SURGERY.  ONLY 1 SUPPORT PERSON MAY BE PRESENT IN THE WAITING ROOM WHILE YOU ARE IN SURGERY.  IF YOU ARE TO BE ADMITTED, ONCE YOU ARE IN YOUR ROOM YOU WILL BE ALLOWED TWO (2) VISITORS. 1 (ONE) VISITOR MAY STAY OVERNIGHT BUT MUST ARRIVE TO THE ROOM BY 8pm.  Minor children may have two parents present. Special consideration for safety and communication needs will be reviewed on a case by case basis. ? ?Special instructions:   ? ?Oral Hygiene is also important to reduce your risk of infection.  Remember - BRUSH YOUR TEETH THE MORNING OF SURGERY WITH YOUR REGULAR TOOTHPASTE ? ? ?- Preparing For Surgery ? ?Before surgery, you can play an important role. Because skin is not sterile, your skin needs to be as free of germs as possible. You can reduce the number of germs on your skin by washing with CHG (chlorahexidine gluconate) Soap before surgery.  CHG is an antiseptic cleaner which kills germs and bonds with the skin to continue killing germs even after washing.   ? ? ?Please do not use if you have an allergy to CHG or antibacterial soaps. If your skin becomes reddened/irritated stop using the CHG.  ?Do not shave  (including legs and underarms) for at least 48 hours prior to first CHG shower. It is OK to shave your face. ? ?Please follow these instructions carefully. ?  ? ? Shower the NIGHT BEFORE SURGERY and the MORNING OF SURGERY with CHG Soap.  ? If you chose to wash your hair, wash your hair first as usual with your normal shampoo. After you shampoo, rinse your hair and body thoroughly to remove the shampoo.  Then [employer] and genitals (private parts) with your normal soap and rinse thoroughly to remove soap. ? ?After that Use CHG Soap as you would any other liquid soap. You can apply CHG directly to the skin and wash gently with a scrungie or a clean washcloth.  ? ?Apply the CHG Soap to your body ONLY FROM THE NECK DOWN.  Do not use on open wounds or open sores. Avoid contact with your eyes, ears, mouth and genitals (private parts). Wash Face and  genitals (private parts)  with your normal soap.  ? ?Wash thoroughly, paying special attention to the area where your surgery will be performed. ? ?Thoroughly rinse your body with warm water from the neck down. ? ?DO NOT shower/wash with your normal soap after using and rinsing off the CHG Soap. ? ?Pat yourself dry with a CLEAN TOWEL. ? ?Wear CLEAN PAJAMAS to bed the night before surgery ? ?Place CLEAN SHEETS on your bed the night before your surgery ? ?DO NOT SLEEP WITH PETS. ? ? ?Day of Surgery: ?Take a shower with CHG soap. ?Wear Clean/Comfortable clothing the morning of surgery ?Do not apply any deodorants/lotions.   ?Remember to brush your teeth WITH YOUR REGULAR TOOTHPASTE. ? ? ? ?COVID testing ? ?If you are going to stay overnight or be admitted after your procedure/surgery and require a pre-op COVID test, please follow these instructions after your COVID test  ? ?You are not required to quarantine however you are required to wear a well-fitting mask when you are out and around people not in your household.  If your mask becomes wet or soiled, replace with a new  one. ? ?Wash your hands often with soap and water for 20 seconds or clean your hands with an alcohol-based hand sanitizer that contains at least 60% alcohol. ? ?Do not share personal items. ? ?Notify your provider: ?if you

## 2022-02-02 ENCOUNTER — Encounter (HOSPITAL_COMMUNITY): Payer: Self-pay

## 2022-02-02 ENCOUNTER — Inpatient Hospital Stay (HOSPITAL_COMMUNITY)
Admission: RE | Admit: 2022-02-02 | Discharge: 2022-02-02 | Disposition: A | Discharge status: Home / Self Care | Payer: Medicaid Other | Source: Ambulatory Visit

## 2022-02-03 ENCOUNTER — Encounter (HOSPITAL_COMMUNITY): Payer: Self-pay

## 2022-02-03 ENCOUNTER — Other Ambulatory Visit: Payer: Self-pay

## 2022-02-03 ENCOUNTER — Encounter (HOSPITAL_COMMUNITY)
Admission: RE | Admit: 2022-02-03 | Discharge: 2022-02-03 | Disposition: A | Discharge status: Home / Self Care | Payer: Medicaid Other | Source: Ambulatory Visit | Attending: Neurosurgery | Admitting: Neurosurgery

## 2022-02-03 ENCOUNTER — Encounter (HOSPITAL_COMMUNITY): Payer: Self-pay | Admitting: Emergency Medicine

## 2022-02-03 VITALS — BP 132/93 | HR 93 | Temp 97.7°F | Resp 18 | Ht 70.0 in | Wt 219.6 lb

## 2022-02-03 DIAGNOSIS — D492 Neoplasm of unspecified behavior of bone, soft tissue, and skin: Secondary | ICD-10-CM | POA: Diagnosis not present

## 2022-02-03 DIAGNOSIS — Z20822 Contact with and (suspected) exposure to covid-19: Secondary | ICD-10-CM | POA: Insufficient documentation

## 2022-02-03 DIAGNOSIS — Z794 Long term (current) use of insulin: Secondary | ICD-10-CM | POA: Insufficient documentation

## 2022-02-03 DIAGNOSIS — E119 Type 2 diabetes mellitus without complications: Secondary | ICD-10-CM | POA: Diagnosis not present

## 2022-02-03 DIAGNOSIS — Z01812 Encounter for preprocedural laboratory examination: Secondary | ICD-10-CM | POA: Diagnosis present

## 2022-02-03 HISTORY — DX: Acute myocardial infarction, unspecified: I21.9

## 2022-02-03 HISTORY — DX: Unspecified osteoarthritis, unspecified site: M19.90

## 2022-02-03 HISTORY — DX: Cerebral infarction, unspecified: I63.9

## 2022-02-03 LAB — BASIC METABOLIC PANEL
Anion gap: 12 (ref 5–15)
BUN: 18 mg/dL (ref 6–20)
CO2: 24 mmol/L (ref 22–32)
Calcium: 9.9 mg/dL (ref 8.9–10.3)
Chloride: 98 mmol/L (ref 98–111)
Creatinine, Ser: 0.84 mg/dL (ref 0.61–1.24)
GFR, Estimated: >60 mL/min (ref >60)
Glucose, Bld: 321 mg/dL — ABNORMAL HIGH (ref 70–99)
Potassium: 4.2 mmol/L (ref 3.5–5.1)
Sodium: 134 mmol/L — ABNORMAL LOW (ref 135–145)

## 2022-02-03 LAB — CBC
HCT: 44.3 % (ref 39.0–52.0)
Hemoglobin: 15.6 g/dL (ref 13.0–17.0)
MCH: 31.8 pg (ref 26.0–34.0)
MCHC: 35.2 g/dL (ref 30.0–36.0)
MCV: 90.4 fL (ref 80.0–100.0)
Platelets: 229 10*3/uL (ref 150–400)
RBC: 4.9 MIL/uL (ref 4.22–5.81)
RDW: 12.5 % (ref 11.5–15.5)
WBC: 10.1 10*3/uL (ref 4.0–10.5)
nRBC: 0 % (ref 0.0–0.2)

## 2022-02-03 LAB — SURGICAL PCR SCREEN
MRSA, PCR: NEGATIVE
Staphylococcus aureus: POSITIVE — AB

## 2022-02-03 LAB — SARS CORONAVIRUS 2 (TAT 6-24 HRS): SARS Coronavirus 2: NEGATIVE

## 2022-02-03 LAB — GLUCOSE, CAPILLARY: Glucose-Capillary: 553 mg/dL (ref 70–99)

## 2022-02-03 NOTE — Progress Notes (Addendum)
PCP - Camelia Eng, PA ? ?Cardiologist - Dr. Rod Holler. ? ?EP- Denies  ? ?Endocrine-Dr. Barton Dubois ? ?Pulm-Denies  ? ?Chest x-ray - 01/08/22 ? ?EKG - 01/08/22 ? ?Stress Test - 04/30/05 ? ?ECHO - Denies  ? ?Cardiac Cath - Denies  ? ?AICD-no ?PM-no ?LOOP-no ? ?Nerve Stimulator-no ? ?Dialysis-no ? ?Sleep Study - no ?CPAP - no ? ?LABS- CBC, BMP, PCR, Covid ? ?ASA- ASA in [place] H/A med- told to stop ? ?ERAS-no ? ?HA1C-7.9- 01/09/22 ?Fasting Blood Sugar - 160 ?Checks Blood Sugar  5 or > times a day, has a Libre II glucose reader. ? ?Anesthesia- Glucose on arrival was 553, patient stated he ran out of Regular Insulin, and did not have time to pick Insulin up this am. Patient ate at 2100 on 3/15/123, not sure he took Lantus. Mr. Mcnease is going to pick up Insulin on the way home. ? ?Mr. Diniz reports history of chest pain, it happens every 2 - 3 months, denies lightheaded, dizziness, n/v or diaphoresis. Patient reported that it can happen when he is doing nothing. Pain is sharp , left side of the chest, last 5-10 min. States the Drs are aware, Mr. Basnett said, "they said I had a heart attack sometime."  Patient saw Dr. Sandrea Hammond ordered a Stress test, it had to be rescheduled. Mr Vazquez states that he call the office and was told it is ok to have the surgery prior to the test. ? ?Mr. Menning's  pain medication Dr. [facility], patient called  the pain clinic and asked  what he should do about Suboxone, he reports that he could take both. Mr Fulford choose to stop Suboxone "I want the pain medication to be effected., patient said." ?Willeen Cass, NP reviewed the chart. ? ?. All instructions explained to the pt, with a verbal understanding of the material. Pt agrees to go over the instructions while at home for a better understanding. Pt also instructed to self quarantine after being tested for COVID-19. The opportunity to ask questions was provided.  ?

## 2022-02-03 NOTE — Progress Notes (Deleted)
PCP -  ? ?Cardiologist -  ? ?EP- ? ?Endocrine- ? ?Pulm- ? ?Chest x-ray -  ? ?EKG -  ? ?Stress Test -  ? ?ECHO -  ? ?Cardiac Cath -  ? ?AICD- ?PM- ?LOOP- ? ?Nerve Stimulator- ? ?Dialysis- ? ?Sleep Study -  ?CPAP -  ? ?LABS- ? ?ASA- ? ?ERAS- ? ?HA1C- ?Fasting Blood Sugar -  ?Checks Blood Sugar _____ times a day ? ?Anesthesia- ? ?Pt denies having chest pain, sob, or fever at this time. All instructions explained to the pt, with a verbal understanding of the material. Pt agrees to go over the instructions while at home for a better understanding. Pt also instructed to self quarantine after being tested for COVID-19. The opportunity to ask questions was provided.  ?

## 2022-02-03 NOTE — Progress Notes (Signed)
Surgical Instructions ? ? ? Your procedure is scheduled on 02/04/22. ? Report to [facility] Main Entrance "A" at 11:15 A.M., then check in with the Admitting office. ? Call this number if you have problems the morning of surgery: ? [phone] ? ? If you have any questions prior to your surgery date call [phone]: Open Monday-Friday 8am-4pm ? ? ? Remember: ? Do not eat or drink after midnight the night before your surgery ? ?  ? Take these medicines the morning of surgery with A SIP OF WATER:  ?carvedilol (COREG)  ?DULoxetine (CYMBALTA) ?haloperidol (HALDOL) ?hydrOXYzine (ATARAX) ?omeprazole (PRILOSEC)  ? ?IF NEEDED: ?Carboxymethylcellul-Glycerin (LUBRICATING EYE DROPS OP) ? ?As of today, STOP taking any Aspirin (unless otherwise instructed by your surgeon) Aleve, Naproxen, Ibuprofen, Motrin, Advil, Goody's, BC's, all herbal medications, fish oil, and all vitamins. ? ?Please stop taking SUBOXONE 3 days prior to surgery unless the prescribing doctor has told you differently.  Your last dose will be 02/04/22 if your prescribing doctor has not told you otherwise.  ? ? ?WHAT DO I DO ABOUT MY DIABETES MEDICATION? ? ? ?Do not take oral diabetes medicines (pills) the morning of surgery. ? ?THE NIGHT BEFORE SURGERY, take 36 units of insulin glargine (LANTUS).    ? ? ?The day of surgery, do not take other diabetes injectables, including Byetta (exenatide), Bydureon (exenatide ER), Victoza (liraglutide), or Trulicity (dulaglutide). ? ?THE MORNING OF SURGERY, if your CBG is greater than 220 mg/dL, you may take ? of your sliding scale (insulin aspart (NOVOLOG)) dose of insulin. ? ? ?HOW TO MANAGE YOUR DIABETES ?BEFORE AND AFTER SURGERY ? ?Why is it important to control my blood sugar before and after surgery? ?Improving blood sugar levels before and after surgery helps healing and can limit problems. ?A way of improving blood sugar control is eating a healthy diet by: ? Eating less sugar and carbohydrates ? Increasing  activity/exercise ? Talking with your doctor about reaching your blood sugar goals ?High blood sugars (greater than 180 mg/dL) can raise your risk of infections and slow your recovery, so you will need to focus on controlling your diabetes during the weeks before surgery. ?Make sure that the doctor who takes care of your diabetes knows about your planned surgery including the date and location. ? ?How do I manage my blood sugar before surgery? ?Check your blood sugar at least 4 times a day, starting 2 days before surgery, to make sure that the level is not too high or low. ? ?Check your blood sugar the morning of your surgery when you wake up and every 2 hours until you get to the Short Stay unit. ? ?If your blood sugar is less than 70 mg/dL, you will need to treat for low blood sugar: ?Do not take insulin. ?Treat a low blood sugar (less than 70 mg/dL) with ? cup of clear juice (cranberry or apple), 4 glucose tablets, OR glucose gel. ?Recheck blood sugar in 15 minutes after treatment (to make sure it is greater than 70 mg/dL). If your blood sugar is not greater than 70 mg/dL on recheck, call [phone] for further instructions. ?Report your blood sugar to the short stay nurse when you get to Short Stay. ? ?If you are admitted to the hospital after surgery: ?Your blood sugar will be checked by the staff and you will probably be given insulin after surgery (instead of oral diabetes medicines) to make sure you have good blood sugar levels. ?The goal for blood sugar control after  surgery is 80-180 mg/dL. ? ?     ?Do not wear jewelry or makeup ?Do not wear lotions, powders, perfumes/colognes, or deodorant. ?Do not shave 48 hours prior to surgery.  Men may shave face and neck. ?Do not bring valuables to the hospital. ?Do not wear nail polish, gel polish, artificial nails, or any other type of covering on natural nails (fingers and toes) ?If you have artificial nails or gel coating that need to be removed by a nail  salon, please have this removed prior to surgery. Artificial nails or gel coating may interfere with anesthesia's ability to adequately monitor your vital signs. ? ?[facility] is not responsible for any belongings or valuables. .  ? ?Do NOT Smoke (Tobacco/Vaping)  24 hours prior to your procedure ? ?If you use a CPAP at night, you may bring your mask for your overnight stay. ?  ?Contacts, glasses, hearing aids, dentures or partials may not be worn into surgery, please bring cases for these belongings ?  ?For patients admitted to the hospital, discharge time will be determined by your treatment team. ?  ?Patients discharged the day of surgery will not be allowed to drive home, and someone needs to stay with them for 24 hours. ? ?NO VISITORS WILL BE ALLOWED IN PRE-OP WHERE PATIENTS ARE PREPPED FOR SURGERY.  ONLY 1 SUPPORT PERSON MAY BE PRESENT IN THE WAITING ROOM WHILE YOU ARE IN SURGERY.  IF YOU ARE TO BE ADMITTED, ONCE YOU ARE IN YOUR ROOM YOU WILL BE ALLOWED TWO (2) VISITORS. 1 (ONE) VISITOR MAY STAY OVERNIGHT BUT MUST ARRIVE TO THE ROOM BY 8pm.  Minor children may have two parents present. Special consideration for safety and communication needs will be reviewed on a case by case basis. ? ?Special instructions:   ? ?Oral Hygiene is also important to reduce your risk of infection.  Remember - BRUSH YOUR TEETH THE MORNING OF SURGERY WITH YOUR REGULAR TOOTHPASTE ? ? ?[facility]- Preparing For Surgery ? ?Before surgery, you can play an important role. Because skin is not sterile, your skin needs to be as free of germs as possible. You can reduce the number of germs on your skin by washing with CHG (chlorahexidine gluconate) Soap before surgery.  CHG is an antiseptic cleaner which kills germs and bonds with the skin to continue killing germs even after washing.   ? ? ?Please do not use if you have an allergy to CHG or antibacterial soaps. If your skin becomes reddened/irritated stop using the CHG.  ?Do not shave  (including legs and underarms) for at least 48 hours prior to first CHG shower. It is OK to shave your face. ? ?Please follow these instructions carefully. ?  ? ? Shower the NIGHT BEFORE SURGERY and the MORNING OF SURGERY with CHG Soap.  ? If you chose to wash your hair, wash your hair first as usual with your normal shampoo. After you shampoo, rinse your hair and body thoroughly to remove the shampoo.  Then [employer] and genitals (private parts) with your normal soap and rinse thoroughly to remove soap. ? ?After that Use CHG Soap as you would any other liquid soap. You can apply CHG directly to the skin and wash gently with a scrungie or a clean washcloth.  ? ?Apply the CHG Soap to your body ONLY FROM THE NECK DOWN.  Do not use on open wounds or open sores. Avoid contact with your eyes, ears, mouth and genitals (private parts). Wash Face and  genitals (private parts)  with your normal soap.  ? ?Wash thoroughly, paying special attention to the area where your surgery will be performed. ? ?Thoroughly rinse your body with warm water from the neck down. ? ?DO NOT shower/wash with your normal soap after using and rinsing off the CHG Soap. ? ?Pat yourself dry with a CLEAN TOWEL. ? ?Wear CLEAN PAJAMAS to bed the night before surgery ? ?Place CLEAN SHEETS on your bed the night before your surgery ? ?DO NOT SLEEP WITH PETS. ? ? ?Day of Surgery: ?Take a shower with CHG soap. ?Wear Clean/Comfortable clothing the morning of surgery ?Do not apply any deodorants/lotions.   ?Remember to brush your teeth WITH YOUR REGULAR TOOTHPASTE. ? ? ? ?COVID testing ? ?If you are going to stay overnight or be admitted after your procedure/surgery and require a pre-op COVID test, please follow these instructions after your COVID test  ? ?You are not required to quarantine however you are required to wear a well-fitting mask when you are out and around people not in your household.  If your mask becomes wet or soiled, replace with a new  one. ? ?Wash your hands often with soap and water for 20 seconds or clean your hands with an alcohol-based hand sanitizer that contains at least 60% alcohol. ? ?Do not share personal items. ? ?Notify your provider: ?if you

## 2022-02-04 ENCOUNTER — Encounter (HOSPITAL_COMMUNITY): Admission: RE | Payer: Self-pay | Source: Home / Self Care

## 2022-02-04 ENCOUNTER — Inpatient Hospital Stay (HOSPITAL_COMMUNITY): Admission: RE | Admit: 2022-02-04 | Payer: Medicaid Other | Source: Home / Self Care | Admitting: Neurosurgery

## 2022-02-04 DIAGNOSIS — Z01818 Encounter for other preprocedural examination: Secondary | ICD-10-CM

## 2022-02-04 SURGERY — THORACIC LAMINECTOMY FOR TUMOR
Anesthesia: General

## 2022-04-04 NOTE — Telephone Encounter (Signed)
error 

## 2022-08-21 DEATH — deceased

## 2023-11-13 ENCOUNTER — Other Ambulatory Visit: Payer: Self-pay
